# Patient Record
Sex: Male | Born: 1940 | Race: White | Hispanic: No | Marital: Married | Smoking: Current every day smoker
Health system: Southern US, Community
[De-identification: ages and names within clinical notes are randomized; demographics above are authoritative.]

## PROBLEM LIST (undated history)

## (undated) DIAGNOSIS — I219 Acute myocardial infarction, unspecified: Secondary | ICD-10-CM

## (undated) DIAGNOSIS — I10 Essential (primary) hypertension: Secondary | ICD-10-CM

## (undated) DIAGNOSIS — J449 Chronic obstructive pulmonary disease, unspecified: Secondary | ICD-10-CM

## (undated) DIAGNOSIS — E669 Obesity, unspecified: Secondary | ICD-10-CM

## (undated) DIAGNOSIS — R0602 Shortness of breath: Secondary | ICD-10-CM

## (undated) DIAGNOSIS — E7800 Pure hypercholesterolemia, unspecified: Secondary | ICD-10-CM

## (undated) DIAGNOSIS — I2510 Atherosclerotic heart disease of native coronary artery without angina pectoris: Secondary | ICD-10-CM

## (undated) DIAGNOSIS — C3490 Malignant neoplasm of unspecified part of unspecified bronchus or lung: Secondary | ICD-10-CM

## (undated) HISTORY — DX: Essential (primary) hypertension: I10

## (undated) HISTORY — PX: ANTERIOR CERVICAL DECOMP/DISCECTOMY FUSION: SHX1161

## (undated) HISTORY — DX: Pure hypercholesterolemia, unspecified: E78.00

## (undated) HISTORY — PX: APPENDECTOMY: SHX54

## (undated) HISTORY — PX: ARTHRODESIS: SHX136

## (undated) HISTORY — DX: Obesity, unspecified: E66.9

## (undated) HISTORY — DX: Chronic obstructive pulmonary disease, unspecified: J44.9

## (undated) HISTORY — PX: OTHER SURGICAL HISTORY: SHX169

## (undated) HISTORY — PX: CARPAL TUNNEL RELEASE: SHX101

## (undated) HISTORY — DX: Atherosclerotic heart disease of native coronary artery without angina pectoris: I25.10

## (undated) HISTORY — DX: Malignant neoplasm of unspecified part of unspecified bronchus or lung: C34.90

---

## 1999-04-25 ENCOUNTER — Ambulatory Visit (HOSPITAL_COMMUNITY): Admission: RE | Admit: 1999-04-25 | Discharge: 1999-04-25 | Payer: Self-pay | Admitting: Gastroenterology

## 2001-11-24 ENCOUNTER — Ambulatory Visit (HOSPITAL_BASED_OUTPATIENT_CLINIC_OR_DEPARTMENT_OTHER): Admission: RE | Admit: 2001-11-24 | Discharge: 2001-11-24 | Payer: Self-pay | Admitting: Orthopedic Surgery

## 2003-07-06 ENCOUNTER — Encounter: Admission: RE | Admit: 2003-07-06 | Discharge: 2003-07-06 | Payer: Self-pay | Admitting: Geriatric Medicine

## 2003-08-09 ENCOUNTER — Encounter: Admission: RE | Admit: 2003-08-09 | Discharge: 2003-08-09 | Payer: Self-pay | Admitting: Geriatric Medicine

## 2003-10-17 ENCOUNTER — Encounter: Admission: RE | Admit: 2003-10-17 | Discharge: 2004-01-15 | Payer: Self-pay | Admitting: Neurological Surgery

## 2004-03-19 ENCOUNTER — Observation Stay (HOSPITAL_COMMUNITY): Admission: RE | Admit: 2004-03-19 | Discharge: 2004-03-20 | Payer: Self-pay | Admitting: Neurological Surgery

## 2004-07-10 ENCOUNTER — Encounter: Admission: RE | Admit: 2004-07-10 | Discharge: 2004-07-10 | Payer: Self-pay | Admitting: Geriatric Medicine

## 2006-06-17 DIAGNOSIS — I219 Acute myocardial infarction, unspecified: Secondary | ICD-10-CM

## 2006-06-17 HISTORY — PX: CARDIAC CATHETERIZATION: SHX172

## 2006-06-17 HISTORY — DX: Acute myocardial infarction, unspecified: I21.9

## 2007-03-18 DIAGNOSIS — I2510 Atherosclerotic heart disease of native coronary artery without angina pectoris: Secondary | ICD-10-CM

## 2007-03-18 HISTORY — DX: Atherosclerotic heart disease of native coronary artery without angina pectoris: I25.10

## 2007-04-06 ENCOUNTER — Ambulatory Visit: Payer: Self-pay | Admitting: Cardiology

## 2007-04-06 ENCOUNTER — Ambulatory Visit: Payer: Self-pay | Admitting: Internal Medicine

## 2007-04-06 ENCOUNTER — Inpatient Hospital Stay (HOSPITAL_COMMUNITY): Admission: EM | Admit: 2007-04-06 | Discharge: 2007-04-09 | Payer: Self-pay | Admitting: Emergency Medicine

## 2007-04-20 ENCOUNTER — Ambulatory Visit: Payer: Self-pay | Admitting: Internal Medicine

## 2007-04-23 ENCOUNTER — Encounter (HOSPITAL_COMMUNITY): Admission: RE | Admit: 2007-04-23 | Discharge: 2007-04-27 | Payer: Self-pay | Admitting: Internal Medicine

## 2007-08-13 ENCOUNTER — Ambulatory Visit: Payer: Self-pay | Admitting: Cardiology

## 2007-08-13 ENCOUNTER — Observation Stay (HOSPITAL_COMMUNITY): Admission: EM | Admit: 2007-08-13 | Discharge: 2007-08-14 | Payer: Self-pay | Admitting: Emergency Medicine

## 2007-08-20 ENCOUNTER — Ambulatory Visit: Payer: Self-pay

## 2007-08-25 ENCOUNTER — Ambulatory Visit: Payer: Self-pay | Admitting: Internal Medicine

## 2008-03-04 ENCOUNTER — Ambulatory Visit: Payer: Self-pay | Admitting: Internal Medicine

## 2008-07-03 ENCOUNTER — Encounter: Admission: RE | Admit: 2008-07-03 | Discharge: 2008-07-03 | Payer: Self-pay | Admitting: Neurological Surgery

## 2008-08-25 ENCOUNTER — Inpatient Hospital Stay (HOSPITAL_COMMUNITY): Admission: RE | Admit: 2008-08-25 | Discharge: 2008-08-26 | Payer: Self-pay | Admitting: Neurological Surgery

## 2009-04-17 ENCOUNTER — Ambulatory Visit: Payer: Self-pay | Admitting: Internal Medicine

## 2009-04-17 DIAGNOSIS — I509 Heart failure, unspecified: Secondary | ICD-10-CM

## 2009-04-17 DIAGNOSIS — E782 Mixed hyperlipidemia: Secondary | ICD-10-CM | POA: Insufficient documentation

## 2009-04-17 DIAGNOSIS — I5030 Unspecified diastolic (congestive) heart failure: Secondary | ICD-10-CM | POA: Insufficient documentation

## 2009-04-17 DIAGNOSIS — I2510 Atherosclerotic heart disease of native coronary artery without angina pectoris: Secondary | ICD-10-CM | POA: Insufficient documentation

## 2009-04-17 DIAGNOSIS — I10 Essential (primary) hypertension: Secondary | ICD-10-CM | POA: Insufficient documentation

## 2009-04-17 DIAGNOSIS — F17200 Nicotine dependence, unspecified, uncomplicated: Secondary | ICD-10-CM

## 2009-10-19 ENCOUNTER — Ambulatory Visit (HOSPITAL_COMMUNITY): Admission: RE | Admit: 2009-10-19 | Discharge: 2009-10-19 | Payer: Self-pay | Admitting: Geriatric Medicine

## 2010-04-06 ENCOUNTER — Encounter: Admission: RE | Admit: 2010-04-06 | Discharge: 2010-04-06 | Payer: Self-pay | Admitting: Geriatric Medicine

## 2010-04-13 ENCOUNTER — Encounter: Admission: RE | Admit: 2010-04-13 | Discharge: 2010-04-13 | Payer: Self-pay | Admitting: Geriatric Medicine

## 2010-05-02 ENCOUNTER — Ambulatory Visit: Payer: Self-pay | Admitting: Internal Medicine

## 2010-07-19 NOTE — Assessment & Plan Note (Signed)
Summary: PER CHECK OUT/SF      Allergies Added: NKDA  CC:  rov.  Pt states he is having leg pain and burning in calves.   His BP has been up and down lately.  Nothing too high but he has noticed a difference.  [name]  History of Present Illness: [name] is a very pleasant 70 year old male with a history of coronary artery disease with previous inferolateral ST- elevation myocardial infarction treated with PTCA and bare metal stenting of his left circumflex in October 2008.  Ejection fraction was 45%.  He had mild nonobstructive disease elsewhere.  He also has a history of hypertension, hyperlipidemia, obesity, and COPD with ongoing tobacco use.   Myoview March 2009 showed an EF of 56% with previous large inferolateral infarct.  It was thought that the EF was overestimated.  There was no ischemia.   Returns for routine f/u. Several months ago fell while carrying a TV and banged up his left side. 3 weeks ago had MRI for back and leg pain. Had MRI and told he had pinched nerve in his lower back. Got a cortisone injection with minimal relief. Now walks with a cane.   Occasional transient CP about 1x/month. Not related to exertion. Still smoking < 1 ppd. SBP typically in 120s. Dr. Pete Glatter checked his lipids.    Current Medications (verified): 1)  Plavix 75 Mg Tabs (Clopidogrel Bisulfate) .... Take One Tablet By Mouth Daily 2)  Lipitor 80 Mg Tabs (Atorvastatin Calcium) .... Take One Tablet By Mouth Daily. 3)  Carvedilol 6.25 Mg Tabs (Carvedilol) .... Take One Tablet By Mouth Twice A Day 4)  Diovan Hct 80-12.5 Mg Tabs (Valsartan-Hydrochlorothiazide) .... Take One Tablet By Mouth Once Daily. 5)  Aspirin Ec 325 Mg Tbec (Aspirin) .... Take One Tablet By Mouth Daily  Allergies (verified): No Known Drug Allergies  Past History:  Past Medical History: Last updated: 04/17/2009 1. CAD status post inferolateral STEMI October 2008    --PCI with BMS to LCX    --myoview 2009. inferior infarct. no  ischemia 2. Ejection fraction 45% 3. Hypertension 4. High cholesterol 5. COPD with ongoing tobacco 6. Obesity   Review of Systems       As per HPI and past medical history; otherwise all systems negative.   Vital Signs:  Patient profile:   70 year old male Height:      66 inches Weight:      209 pounds BMI:     33.86 Pulse rate:   55 / minute Pulse rhythm:   regular BP sitting:   142 / 74  (left arm) Cuff size:   large  Vitals Entered By: Judithe Modest CMA (May 02, 2010 4:01 PM)   Physical Exam  General:  Well appearing. no resp difficulty. hoarse. walks with cane. HEENT: normal Neck: supple. no JVD. Carotids 2+ bilat; no bruits. No lymphadenopathy or thryomegaly appreciated. Cor: PMI nondisplaced. Regular rate & rhythm. No rubs, gallops, murmur. Lungs: decreased air movement throughout. mild exp wheeze Abdomen: soft, nontender, nondistended. Good bowel sounds. Extremities: no cyanosis, clubbing, rash, edema Neuro: alert & orientedx3, cranial nerves grossly intact. moves all 4 extremities w/o difficulty. affect pleasant    Impression & Recommendations:  Problem # 1:  CAD, NATIVE VESSEL (ICD-414.01) Stable. No evidence of ischemia. Continue current regimen. Said he could stop Plvix if price remained an issue.  Problem # 2:  HYPERTENSION, BENIGN (ICD-401.1) BP slightly elevated here. Well controlled at home. Continue current regimen.  Problem #  3:  TOBACCO ABUSE (ICD-305.1) Counseled on need to quit.  Patient Instructions: 1)  Your physician recommends that you schedule a follow-up appointment in: 1 year 2)  Your physician recommends that you continue on your current medications as directed. Please refer to the Current Medication list given to you today.

## 2010-09-27 LAB — CBC
HCT: 42.7 % (ref 39.0–52.0)
Hemoglobin: 14.9 g/dL (ref 13.0–17.0)
MCHC: 34.8 g/dL (ref 30.0–36.0)
MCV: 92 fL (ref 78.0–100.0)
Platelets: 228 10*3/uL (ref 150–400)
RBC: 4.64 MIL/uL (ref 4.22–5.81)
RDW: 14.4 % (ref 11.5–15.5)
WBC: 10.1 10*3/uL (ref 4.0–10.5)

## 2010-09-27 LAB — BASIC METABOLIC PANEL
BUN: 9 mg/dL (ref 6–23)
CO2: 26 mEq/L (ref 19–32)
Calcium: 9.1 mg/dL (ref 8.4–10.5)
Chloride: 109 mEq/L (ref 96–112)
Creatinine, Ser: 0.82 mg/dL (ref 0.4–1.5)
GFR calc Af Amer: >60 mL/min (ref >60)
GFR calc non Af Amer: >60 mL/min (ref >60)
Glucose, Bld: 97 mg/dL (ref 70–99)
Potassium: 4 mEq/L (ref 3.5–5.1)
Sodium: 141 mEq/L (ref 135–145)

## 2010-09-27 LAB — APTT: aPTT: 32 seconds (ref 24–37)

## 2010-09-27 LAB — PROTIME-INR
INR: 1.1 (ref 0.00–1.49)
Prothrombin Time: 14 seconds (ref 11.6–15.2)

## 2010-10-01 ENCOUNTER — Telehealth: Payer: Self-pay | Admitting: Internal Medicine

## 2010-10-01 ENCOUNTER — Other Ambulatory Visit: Payer: Self-pay | Admitting: Internal Medicine

## 2010-10-01 MED ORDER — CARVEDILOL 6.25 MG PO TABS: 6.2500 mg | ORAL_TABLET | Freq: Two times a day (BID) | ORAL | Status: DC

## 2010-10-01 NOTE — Telephone Encounter (Signed)
Pt needs refill at [facility] for carvedilol-pt out as of Saturday-needs refill asap

## 2010-10-30 NOTE — Assessment & Plan Note (Signed)
[facility]                            CARDIOLOGY OFFICE NOTE   Flores, Grant                       MRN:          [id number]  DATE:03/04/2008                            DOB:          06-01-1941    PRIMARY CARE PHYSICIAN:  Hal T. Stoneking, MD   INTERVAL HISTORY:  Grant Flores is a very pleasant 70 year old male with a  history of coronary artery disease with previous inferolateral ST-  elevation myocardial infarction treated with PTCA and stenting of his  left circumflex in October 2008.  Ejection fraction was 45%.  He had  mild nonobstructive disease elsewhere.  He also has a history of  hypertension, hyperlipidemia, obesity, and COPD with ongoing tobacco  use.   He was admitted back in February with chest pain.  Myoview showed an EF  of 56% with previous large inferolateral infarct.  It was thought that  the EF was overestimated.  There was no ischemia.   He returns today for routine followup.  He says he is doing very well.  He is not exercising per se, but he is still working at [employer] and  denies any chest pain.  He has not had any significant dyspnea.  Unfortunately, he has continued to smoke about half a pack of cigarettes  a day, though he is trying to quit.  He denies any claudication or GI  symptoms.  He has been following his blood pressures at home and  systolics have been ranging from 115-122.  He did have a couple of  readings last week where his systolics were about 100 and he was  somewhat dizzy on standing.  His heart rate has been ranging 50-60.  He  has recently developed recent bronchitis, and he has been seen by Dr.  Pete Glatter.   CURRENT MEDICATIONS:  1. Aspirin 325.  2. Plavix 75 a day.  3. Lipitor 80 a day.  4. Benicar 40/12.5 a day.  5. Coreg 6.25 b.i.d.   PHYSICAL EXAMINATION:  GENERAL:  He is in no acute distress, ambulates  in the clinic without any respiratory distress.  VITAL SIGNS:  Blood pressure is  115/68, on followup it was 138/78, heart  rate is 48, weight is 209.  HEENT:  Normal.  NECK:  Supple.  No JVD.  Carotids are 2+ bilaterally without bruits.  There is no lymphadenopathy or thyromegaly.  CARDIAC:  PMI is nondisplaced.  He has regular rate and rhythm with S4.  No murmur.  LUNGS:  Clear on the right.  He has some bronchial breath sounds and  left lower lobe.  He has no wheezing.  There was a prolonged expiratory  phase.  ABDOMEN:  Obese, nontender, nondistended.  There is no  hepatosplenomegaly, no bruits, no masses appreciated.  EXTREMITIES:  Warm with no cyanosis, clubbing, or edema.  No rash.  NEUROLOGIC:  Alert and oriented x3.  Cranial nerves II through XII are  intact.  Moves all 4 extremities without difficulty.  His voice is  hoarse.  He attributes this to his upper respiratory tract infection.   EKG shows  sinus bradycardia rate of 48 with previous inferior infarct  and previous inferolateral infarct.  No acute ST-T wave abnormalities.  He does have some ST T-wave flattening laterally.   ASSESSMENT AND PLAN:  1. Coronary artery disease is stable.  No evidence of ischemia.      Continue current therapy.  2. Hypertension.  Blood pressure is generally well controlled.  Given      his bradycardia, I did consider dropping back his Coreg to 3.125 a      day, but he says he feels fine with this, and his heart rate seems      to be okay on his monitor.  I told him to watch his monitor      closely.  If his heart rate drops below 50 much, I told him to cut      back his Coreg down to 3.125 twice a day.  3. Hyperlipidemia followed by Dr. Pete Glatter.  Goal LDL is less than      70.  4. Tobacco use.  Once again counseled him on the need to stop smoking.      I did ask him to followup with Dr. Pete Glatter.  If his bronchitis      and hoarseness persisted, perhaps I think about getting a chest x-      ray.   DISPOSITION:  We will see him back for routine followup in 9  months.     Grant Buckles. Bensimhon, MD  Electronically Signed    DRB/MedQ  DD: 03/04/2008  DT: 03/05/2008  Job #: [id number]

## 2010-10-30 NOTE — H&P (Signed)
NAME:  Grant Flores, Grant Flores                ACCOUNT NO.:  [id number]   MEDICAL RECORD NO.:  [id number]          PATIENT TYPE:  INP   LOCATION:  1827                         FACILITY:  [facility]   PHYSICIAN:  Bevelyn Buckles. Bensimhon, MDDATE OF BIRTH:  14-Dec-1940   DATE OF ADMISSION:  04/06/2007  DATE OF DISCHARGE:                              HISTORY & PHYSICAL   PRIMARY CARE PHYSICIAN:  Hal T. Stoneking, M.D.   REASON FOR ADMISSION:  Acute inferior lateral ST elevation myocardial  infarction.   HISTORY OF PRESENT ILLNESS:  [name] is a 70 year old male with  history of hypertension and tobacco use.  He has no known history of  heart disease.  He has never had a cardiac catheterization.  Saturday  night he developed some chest burning and throat tightness.  He went to  Urgent Care.  He did not get an EKG.  He was treated for presumed  bronchitis.  Symptoms resolved shortly thereafter.  This evening he went  to bed and woke up about 11 o'clock with recurrent chest and throat  burning.  He came to the emergency room.  On arrival to the emergency  room, he had about 8/10 symptoms.  He had about 7 mm of inferior lateral  ST elevation.  A code study was activated.  He was treated with heparin,  aspirin, nitroglycerin, 600 mg of Plavix as well as Lopressor and  brought emergently to the catheterization lab.   REVIEW OF SYSTEMS:  He denies any heart failure symptoms.  He has not  had any bleeding, no focal neurologic symptoms.  He is not a diabetic.  The remainder of the review of systems is negative except for the HPI  and problem list.   PROBLEM LIST:  1. Hypertension.  2. Obesity.  3. Tobacco use ongoing.   CURRENT MEDICATION:  Benicar.   ALLERGIES:  NO KNOWN DRUG ALLERGIES.   SOCIAL HISTORY:  He is married.  He works at [employer].  He smokes  about a pack and a half of cigarettes a day which he has done for a long  time.  He denies any alcohol or drug use.   FAMILY HISTORY:  Mother died  at 26, no history of coronary artery  disease.  Father died at 64, apparently had an myocardial infarction at  age 64.   PHYSICAL EXAM:  GENERAL APPEARANCE:  He is uncomfortable.  His  respirations are unlabored.  VITAL SIGNS:  Blood pressure was 150/90.  Heart rate was 70.  He was  sating 96% on 2 liters nasal cannula.  HEENT:  Unremarkable, otherwise normal.  NECK:  Thick, unable to clearly assess JVD.  Carotids are 1+ bilaterally  without any bruits.  There is no lymphadenopathy or thyromegaly.  CARDIAC:  PMI was mildly laterally displaced.  He had distant heart  sounds.  He was regular with no obvious murmurs, rubs or gallops.  LUNGS:  Clear.  ABDOMEN:  Obese, nontender, nondistended.  There is no  hepatosplenomegaly, no bruits.  No masses appreciated.  Hypoactive bowel  sounds.  EXTREMITIES:  Warm.  There  is no cyanosis, clubbing or edema.  Distal  pulses were 1+ bilaterally.  No rash.   EKG shows normal sinus rhythm at a rate of 94.  There is 7 mm of the  inferolateral ST depression as well as reciprocal ST depression in V1-V2  consistent with inferior posterolateral myocardial infarction.   LABORATORY DATA:  Current labs show a white count of 21.1, hemoglobin of  15.6, platelet count of 319.  Sodium is 139, potassium 3.3, chloride  103, glucose 136, BUN 13, creatinine 0.9.   ASSESSMENT:  1. Acute inferior posterolateral myocardial infarction.  2. Hypertension.  3. Tobacco use.  4. Prominent leukocytosis.   PLAN/DISCUSSION:  He is being taken emergently to the catheterization  lab with Dr. Riley Kill.  Postcatheterization, he will need cardiac rehab  as well as smoking cessation consultation.  We will need to watch his  leukocytosis.  This may be simply reactive due to his myocardial  infarction, but I will also have to rule out other underlying causes.      Bevelyn Buckles. Bensimhon, MD  Electronically Signed     DRB/MEDQ  D:  04/06/2007  T:  04/06/2007  Job:  [id number]

## 2010-10-30 NOTE — Assessment & Plan Note (Signed)
[facility]                            CARDIOLOGY OFFICE NOTE   Grant Flores                       MRN:          [id number]  DATE:04/20/2007                            DOB:          29-Nov-1940    PRIMARY CARE PHYSICIAN:  Merlene Laughter, M.D.   INTERVAL HISTORY:  [name] is a very pleasant 70 year old male with a  history of hypertension, hyperlipidemia and COPD.  He was recently  hospitalized for an acute anterolateral ST-elevation myocardial  infarction.  He underwent stenting of a 99% left circumflex lesion.  He  had mild nonobstructive disease elsewhere.  Ejection fraction was 45%.   He returns today for routine followup.  He is doing well.  No chest pain  or shortness of breath.  He has essentially quit smoking.  He has only  had four cigarettes since his discharge and is committed to quitting  completely.  He has not had any orthopnea, no PND, no lower extremity  edema.   CURRENT MEDICATIONS:  1. Benicar 10 mg a day.  2. Aspirin 325.  3. Plavix 75.  4. Lipitor 80.  5. Coreg 3.125 b.i.d.   PHYSICAL EXAMINATION:  He is well-appearing, no acute distress.  Ambulates around the clinic without any respiratory difficulty.  Affect  is very pleasant.  Blood pressure is 142/78, heart rate 61, weight is  212.  HEENT:  Normal.  NECK:  Supple, no JVD.  CAROTIDS:  Are 2+ bilaterally without any bruits.  There is no  lymphadenopathy or thyromegaly.  CARDIAC:  PMI is nondisplaced.  He has regular rate and rhythm.  No  murmurs, rubs or gallops.  LUNGS:  Clear.  ABDOMEN:  Obese, nontender, nondistended.  There is no  hepatosplenomegaly, no bruits, no masses, good bowel sounds.  EXTREMITIES:  Warm with no cyanosis, clubbing or edema.  No rash.  NEUROLOGIC:  Alert and oriented x3.  Cranial nerves II-XII are intact.  Moves all four extremities without difficulty.   EKG shows normal sinus rhythm, rate of 63, with a lateral infarct with Q  waves  in I, aVL and V6.  There are no acute ST-T wave abnormalities.   ASSESSMENT AND PLAN:  1. Coronary artery disease status post recent inferolateral myocardial      infarction.  He is doing well.  No evidence of ischemia.  He is      being referred to cardiac rehab.  2. Hypertension.  Blood pressure is back up.  We will switch him back      over to his Benicar HCT 40/12.5.  3. Hyperlipidemia.  Continue statin.  Goal LDL is less than 70.  4. Tobacco use.  I congratulated him on his cutting down and      encouraged him to quit completely.  5. Ischemic cardiomyopathy.  LV is only mildly decreased.  Continue      current therapy.  He is on a beta blocker and ARB.  Will titrate      his Coreg as tolerated.   DISPOSITION:  We will see him back in several months for routine  followup.  I cleared him to go back to work in 2 weeks.     Bevelyn Buckles. Bensimhon, MD  Electronically Signed    DRB/MedQ  DD: 04/20/2007  DT: 04/21/2007  Job #: [id number]   cc:   Grant Flores, M.D.

## 2010-10-30 NOTE — Cardiovascular Report (Signed)
NAME:  Grant Flores, Grant Flores NO.:  [id number]   MEDICAL RECORD NO.:  [id number]          PATIENT TYPE:  INP   LOCATION:  2040                         FACILITY:  [facility]   PHYSICIAN:  Arturo Morton. Riley Kill, MD, FACCDATE OF BIRTH:  28-Aug-1940   DATE OF PROCEDURE:  04/06/2007  DATE OF DISCHARGE:  04/09/2007                            CARDIAC CATHETERIZATION   INDICATIONS:  The patient is a 70 year old who presents with an  inferolateral wall myocardial infarction.  He was seen by Dr. Gala Romney,  and set up for urgent cardiac catheterization.  EKG was consistent with  ST-elevation MI.   PROCEDURE:  1. Left heart catheterization.  2. Selective coronary arteriography.  3. Selective left ventriculography.  4. PTCA and stenting of the circumflex coronary artery.   DESCRIPTION OF THE PROCEDURE:  The patient was brought to the  catheterization laboratory and prepped and draped in the usual fashion.  Through an anterior puncture the femoral artery was entered.  Views of  the left and right coronaries were obtained in multiple angiographic  projections.  The patient received clopidogrel and also bivalirudin.  The patient had a ruptured plaque in the mid circumflex coronary artery.  The patient had a therapeutic ACT after administration of the Angiomax.  A 180-cm Asahi Prowater wire was then utilized.  A 6-French FL-4 Scimed  guiding catheter was utilized and the lesion was crossed with a Midwife.  A balloon dilatation was done with a 2.5 x 12 Maverick.  Intracoronary nitroglycerin was then administered.  ACT was checked  during the case and was 407 seconds.  A 2.75 x 23 Vision non-drug-  eluting platform was then deployed to the site, and deployed at 13  atmospheres.  Post dilatation was then done up to 14 atmospheres  throughout the course of the stent.  Because of slow flow, intracoronary  verapamil was administered.  Following this, ventriculography was  performed in the  RAO projection.  The femoral sheath was sutured into  place without complication.  The patient was taken to the coronary care  unit in satisfactory clinical condition.   HEMODYNAMIC DATA:  Initial central aortic pressure was 123/78 with a  mean of 98.  Left ventricular pressure was 142/33.  There was not a  significant gradient on pullback across the aortic valve.   ANGIOGRAPHIC DATA:  1. Ventriculography was done in the RAO projection.  There was a      moderate area of inferior wall hypokinesis with an estimate of the      ejection fraction of around 50%.  This extended from two-thirds      down the inferior wall up to the inferior base.  2. The right coronary artery is a fairly nondominant vessel with a      single small PDA and has segmental irregularities but only 40%      narrowing.  3. The left main is free of critical disease.  4. The LAD courses to the apex.  There is some irregularity in the mid      vessel with a fair amount of ectasia.  There is a first diagonal      without critical disease and a second diagonal and a third diagonal      that has about 70% ostial stenosis.  It is only a small- to      moderate-size diagonal as well.  5. The circumflex provides a first marginal branch that has luminal      irregularities and about 40% narrowing.  There is also an AV      circumflex which goes to the posterolateral wall.  The continuation      of the first marginal is into a second marginal which is subtotally      occluded.  Following balloon dilatation and stenting, this vessel      was opened up.  There is some diffuse irregularity distally.  TIMI      3 flow was reestablished.   CONCLUSIONS:  1. Acute inferior lateral wall myocardial infarction due to circumflex      occlusion with successful percutaneous stenting using bivalirudin      and clopidogrel loading as per the [facility] protocol.  2. Mild residual disease involving the other coronary segments.    DISPOSITION:  The patient will be treated with aggressive risk factor  reduction.  Aspirin and Plavix will be administered, preferably for 1  year, and then aspirin thereafter.      Arturo Morton. Riley Kill, MD, [facility]  Electronically Signed     TDS/MEDQ  D:  05/18/2007  T:  05/18/2007  Job:  [id number]

## 2010-10-30 NOTE — Op Note (Signed)
NAME:  ABELINO, TIPPIN NO.:  [id number]   MEDICAL RECORD NO.:  [id number]          PATIENT TYPE:  OIB   LOCATION:  3599                         FACILITY:  [facility]   PHYSICIAN:  Stefani Dama, M.D.  DATE OF BIRTH:  1940/09/19   DATE OF PROCEDURE:  DATE OF DISCHARGE:                               OPERATIVE REPORT   PREOPERATIVE DIAGNOSIS:  Cervical spondylosis with myelopathy C3-C4,  status post arthrodesis C4-C6.   POSTOPERATIVE DIAGNOSIS:  Cervical spondylosis with myelopathy C3-C4,  status post arthrodesis C4-C6.   PROCEDURE:  Removal of old hardware C4-C6, decompression of C3-C4 with  spondylitic myelopathy and cervical radiculopathy, interbody arthrodesis  with allograft C3-C4, anterior plate fixation Z6-X0 with Alphatec plate.   SURGEON:  Stefani Dama, MD   FIRST ASSISTANT:  Cristi Loron, MD   ANESTHESIA:  General endotracheal.   INDICATIONS:  Jahi Roza is a 70 year old individual who has had  significant neck, shoulder, and bilateral arm pain.  He has evidence of  advanced spondylosis of C3-C4.  He has been advised regarding surgical  decompression.  He has evidence of cord compression at the level of C3-  C4.   PROCEDURE:  The patient was brought to the operating room, placed on  table in supine position.  After smooth induction of general  endotracheal anesthesia, he was placed in 5 pounds of Halter traction.  Neck was prepped with alcohol, DuraPrep and draped in sterile fashion.  Transverse incision was made in the left side of the neck above his  previously placed incision.  Dissection was carried down through the  platysma and the plane between the sternocleidomastoid and strap muscles  were dissected bluntly until the prevertebral space was reached.  First  identifiable disk space was noted be that of C3-C4 on the radiograph and  then by dissecting inferiorly, the old plate was identified.  It was  gradually skeletonized and each of  the screws were removed individually.  Ultimately, the plate could be removed.  However, at the inferior-most  aspect, there was bone grown over the plate itself and this required  some use of an osteotome and carefully chipping away the bone over the  plate to release it, so that the plate could be removed.  Once the plate  was removed, attention was turned to the C3-C4, a large ventral  osteophyte was taken off with a Leksell rongeur.  Then, a diskectomy was  performed removing a substantial quantity of severely degenerated  desiccated disk material from the interspace at C3-C4.  As the region of  posterior longitudinal ligament was reached, there was noted to be a  large osteophyte from the inferior margin of body of C3 that caused  compression into the spinal canal itself.  This was carefully taken up  after drilling it free from the inferior margin of body of C3 and it was  removed in a piecemeal fashion.  Then, the posterior longitudinal  ligament was taken up.  This allowed for good decompression from side-to-  side and ultimately the undersurface of C4 was taken down with a  2-mm  Kerrison punch very carefully.  With the decompression being obtained  meticulously, hemostasis in the space was obtained using some pledgets  of Gelfoam soaked in thrombin, which were later irrigated away.  An 8-mm  trans graft was then cut shaped in size to the appropriate size to fit  within the interspace.  It was filled with demineralized bone matrix in  the form of BMP putty.  This was then placed into the space.  Traction  was removed.  A 16-mm standard size trestle plate was fitted to the  ventral aspect of the vertebral bodies and secured with 4 locking 4 x 14  mm screws.  Then, with a final radiograph confirming good position of  the hardware, the area was  irrigated copiously with antibiotic irrigating solution and the platysma  was closed with 3-0 Vicryl and 3-0 Vicryl was used in the  subcuticular  tissues.  The dry sterile dressing was placed on the skin.  The patient  tolerated the procedure well and was returned to recovery in stable  condition.      Stefani Dama, M.D.  Electronically Signed     HJE/MEDQ  D:  08/25/2008  T:  08/26/2008  Job:  [id number]

## 2010-10-30 NOTE — Assessment & Plan Note (Signed)
[facility]                            CARDIOLOGY OFFICE NOTE   JAMIEN, CASANOVA                       MRN:          [id number]  DATE:08/25/2007                            DOB:          1941/04/12    PRIMARY CARE PHYSICIAN:  Dr. Merlene Laughter.   INTERVAL HISTORY:  Mr. Lundstrom is a 70 year old male with a history of  previous inferolateral ST elevation myocardial infarction treated with  PTCA and stenting of his left circumflex in October 2008.  Ejection  fraction was 45%.  He had mild nonobstructive disease elsewhere.  He  also has a history of hypertension, hyperlipidemia, obesity, and COPD  with ongoing tobacco use.   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  After a long period of frustration  he developed dizziness and became somewhat sleepy as if he was going to  be passed out.  He was taken to the hospital.  He ruled out for  myocardial infarction with serial cardiac markers.  All of his other  workup was negative.  He was discharged home with an outpatient Myoview  which showed an EF of 56% with a previous large inferolateral infarct.  It was thought that the EF was overestimated.   Since that time he says he has not had any significant problems.  He  feels okay.  He does say that he does not sleep well. He does have some  snoring, but denies extreme daytime fatigue.  He has not had any further  chest pain or dyspnea.  He is still smoking several cigarettes a day.   CURRENT MEDICATIONS:  1. Aspirin 325.  2. Plavix 75.  3. Lipitor 80.  4. Coreg 3.125 b.i.d.  5. Benicar 40/12.5 a day.   PHYSICAL EXAMINATION:  GENERAL:  On walking into the room he was sitting  on the exam table falling asleep.  He was easily arousable.  He was not  snoring at the time.  He had only been in there several minutes by  himself.  VITAL SIGNS:  Blood pressure was 132/72, heart rate 65, weight 210.  HEENT:  Normal.  NECK:  Supple.  There is no JVD.  Carotids 2+ bilaterally without  bruits.  There is no lymphadenopathy or thyromegaly.  CARDIAC:  Regular  rate and rhythm with S4.  PMI is nondisplaced.  There is no murmur.  LUNGS:  Clear.  ABDOMEN:  Obese, nontender, nondistended.  There is no  hepatosplenomegaly, no bruits, no masses.  Good bowel sounds.  EXTREMITIES:  Warm with no cyanosis, clubbing or edema.  No rash.  NEUROLOGICAL:  Alert and oriented x3.  Cranial nerves II-XII are intact.  Moves all four extremities without difficulty.   ASSESSMENT/PLAN:  1. Coronary artery disease status post previous inferolateral      myocardial infarction.  Is doing well.  There is no evidence of      ischemia.  A stress test  was reassuring.  2. Hypertension.  His blood pressure continues to be elevated.      Increase Coreg to 6.25 b.i.d.  3. Hyperlipidemia.  Continue statin.  Goal LDL less than 70.  This is      followed by Dr. Pete Glatter.  4. Tobacco use.  Once again reminded him of the need to stop smoking.  5. Dizziness and fatigue.  I suspect he probably has severe      obstructive sleep apnea.  I have talked to him at length about      this.  He refuses a sleep study at this time.  I also have told him      to consider behavioral methods such as weight loss.   DISPOSITION:  Will see him back in six months for routine follow-up.     Bevelyn Buckles. Bensimhon, MD  Electronically Signed    DRB/MedQ  DD: 08/25/2007  DT: 08/26/2007  Job #: [id number]

## 2010-10-30 NOTE — Discharge Summary (Signed)
NAME:  Grant Flores, Grant Flores NO.:  [id number]   MEDICAL RECORD NO.:  [id number]          PATIENT TYPE:  OBV   LOCATION:  5511                         FACILITY:  [facility]   PHYSICIAN:  Jonelle Sidle, MD DATE OF BIRTH:  [birth year]   DATE OF ADMISSION:  08/13/2007  DATE OF DISCHARGE:  08/14/2007                               DISCHARGE SUMMARY   PRIMARY CARDIOLOGIST:  Bevelyn Buckles. Bensimhon, M.D.   PRIMARY CARE PHYSICIAN:  Hal T. Stoneking, M.D.   PROCEDURES:  Performed during hospitalization:  None.   DISCHARGE DIAGNOSES:  1. Atypical chest pain, resolved.  2. Status post lateral myocardial infarction in 2008.      a.     Status post bare metal stent of the left circumflex.  3. Ischemic cardiomyopathy with an ejection fraction of 45%.  4. Hypertension.  5. Hypercholesterolemia.  6. Chronic obstructive pulmonary disease.  7. Obesity.  8. Ongoing tobacco abuse.   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  The patient began to  have some dizziness associated with this frustration.  He also began to  feel very sleepy but did not feel as if he was going to pass out.  The  patient felt a burning episode in his neck and chest pain that was  similar to his symptoms he had previous to when he presented for MI.  The patient did not have any further symptoms since that one episode of  chest burning and his dizziness resolved before he came to the emergency  room.  The patient was sent here by a first aid worker on his job to  have further evaluation.  The patient states that he felt this was  related to stress.  The patient was seen and examined by Dr. Simona Huh and Valetta Close, M.D. cardiology resident in the emergency room.  The patient was  admitted  to rule out myocardial infarction.  The patient was not placed  on any anticoagulation but monitored closely.  EKG showed normal sinus rhythm, sinus brady with evidence of prior  lateral infarct and inferior infarct with no new EKG changes.  Initial  cardiac enzymes were found to be negative.  All other labs were found to  be stable.  The patient was kept overnight for observation and was without chest  discomfort overnight or any other symptoms.  The patient was seen and examined by Dr. [name] on day of  discharge and blood pressure was found to be stable at 124/69, heart  rate 84, respirations 20, temperature 97.6.  The patient's EKG revealed  a normal sinus rhythm with age-indeterminate lateral MI.   The patient was found to be stable for discharge and will follow up with  Dr. Arvilla Meres and have an adenosine stress test as an outpatient  with continued cardiac management.   DISCHARGE LABORATORY:  Cholesterol 98, lipids 60, HDL  36, LDL 50.  Sodium 143, potassium 4, chloride 108, CO2 29, glucose 95, BUN 9,  creatinine 0.82.  Hemoglobin 14, hematocrit 41.5, white blood cells  10.6, platelets 212.  Troponin 0.01.  CK 143, CK-MB 3.6.  Chest x-ray  revealing chronic interstitial disease, similar to 2005, no definite CHF  or pneumonia.   DISCHARGE MEDICATIONS:  1. Benicar/HCTZ 40/12.5 mg.  2. Aspirin 325 mg daily.  3. Plavix 75 mg daily.  4. Lipitor 80 mg daily.  5. Coreg 3.125 mg twice a day.  6. Nitroglycerin 0.4 mg p.r.n. chest discomfort.   ALLERGIES:  No known drug allergies.   FOLLOWUP PLANS/APPOINTMENTS:  1. The patient to follow up with an adenosine stress Myoview on March      5th at 7:45 a.m. in the [facility] Cardiology Office.  2. The patient is to follow up with Dr. Bevelyn Buckles. Bensimhon on March      10th at 2 p.m. for a followup appointment and discussion of test      results.  3. The patient is to follow up with his primary care physician, Dr.       Pete Glatter, for continued medical management.   The patient has been given a prescription for nitroglycerin to be used  p.r.n. for recurrent chest discomfort.   Time spent with the patient to include physician time, 40 minutes.      Bettey Mare. Lyman Bishop, NP      Jonelle Sidle, MD  Electronically Signed    KML/MEDQ  D:  08/14/2007  T:  08/14/2007  Job:  [id number]   cc:   Hal T. Stoneking, M.D.

## 2010-10-30 NOTE — Discharge Summary (Signed)
NAME:  Grant Flores, REIHL NO.:  [id number]   MEDICAL RECORD NO.:  [id number]          PATIENT TYPE:  INP   LOCATION:  2040                         FACILITY:  [facility]   PHYSICIAN:  Arturo Morton. Riley Kill, MD, FACCDATE OF BIRTH:  [birth year]   DATE OF ADMISSION:  04/06/2007  DATE OF DISCHARGE:  04/09/2007                               DISCHARGE SUMMARY   PRIMARY CARDIOLOGIST:  Dr. Arvilla Meres.   PRIMARY CARE Melisha Eggleton:  Dr. Merlene Laughter.   DISCHARGE DIAGNOSIS:  Acute inferolateral ST-segment elevation  myocardial infarction.   SECONDARY DIAGNOSES:  1. Hypertension.  2. Ongoing tobacco abuse.  3. Hyperlipidemia.  4. Obesity.  5. Ischemic cardiomyopathy with an ejection fraction of approximately      45% by echocardiogram on this admission.  6. Chronic obstructive pulmonary disease.   ALLERGIES:  NO KNOWN DRUG ALLERGIES.   PROCEDURES:  Left heart cardiac catheterization with successful PCI and  stenting of the left circumflex with a  2.75 x 23-mm Vision bare-metal  stent.  A 2-D echocardiogram revealing EF of approximately 45% with  posterior hypokinesis and mild hypokinesis of the base/mid lateral wall.   HISTORY OF PRESENT ILLNESS:  A 70 year old Caucasian male without prior  history of CAD.  He was in his usual state of health until the evening  of October 18 when he developed throat tightness and was evaluated at  urgent care and was treated for presumed bronchitis with antibiotics and  steroids.  Symptoms resolved shortly thereafter, and then on the evening  of October 20, he had recurrence of chest and throat burning.  He  presented to the [facility] emergency room where he was noted to have 7-  mm inferolateral ST-segment elevation.  A code stenting STEMI was  activated, and the patient was taken emergently to the cardiac  catheterization lab.   HOSPITAL COURSE:  Emergent cardiac catheterization was performed  revealing 99% stenosis in the left circumflex  with otherwise diffuse  irregularities and nonobstructive disease.  The circumflex was  successfully stented with a 2.75 x 23-mm Vision bare-metal stent.  Postprocedure, the patient was monitored in the coronary intensive care  unit and did not have any recurrent symptoms.  He was started on beta  blocker, aspirin, Plavix and high-dose statin therapy which he has  tolerated up to this point.  A 2-D echocardiogram was performed on  October 20 revealing an EF of approximately 45% with posterior  hypokinesis and mild hypokinesis of the base/mid lateral wall.  In the  setting of ischemic cardiomyopathy, his beta-blocker was changed to  carvedilol.  He has remained euvolemic throughout hospitalization and  has not had any recurrent chest discomfort.  He has been counseled on  the importance of smoking cessation and is being discharged home today  in satisfactory condition.   DISCHARGE LABORATORY DATA:  Hemoglobin 13.9, hematocrit 40.8, WBC 11.9,  platelets 228, MCV 89.8.  Sodium 138, potassium 4.2, chloride 107, CO2  24, BUN 12, creatinine 0.73, glucose 98.  PT 12.3, INR 0.9, PTT 29, peak  CK 5295, peak MB 499.7, troponin I was  greater than 100.  Total  cholesterol 180, triglycerides 113, HDL 42, LDL 115, calcium 8.7,  hemoglobin A1c 5.6.   DISPOSITION:  The patient is being discharged home today in good  condition.   FOLLOW-UP PLANS AND APPOINTMENTS:  He has follow-up scheduled with Dr.  Arvilla Meres in our [facility] clinic on November 3 at 4 p.m.  He is  asked to follow up with Dr. Pete Glatter as previously scheduled.   DISCHARGE MEDICATIONS:  1. Aspirin 325 mg daily.  2. Plavix 75 mg daily.  3. Lipitor 80 mg every night.  4. Benicar 10 mg daily.  5. Coreg 3.125 mg b.i.d.  6. Nitroglycerin 0.4 mg sublingual p.r.n. chest pain.   OUTSTANDING LABORATORY STUDIES:  None.   DURATION OF DISCHARGE ENCOUNTER:  50 minutes including physician time.      Grant Flores,  ANP      Arturo Morton. Riley Kill, MD, [facility]  Electronically Signed    CB/MEDQ  D:  04/09/2007  T:  04/10/2007  Job:  [id number]   cc:   Hal T. Stoneking, M.D.

## 2010-10-30 NOTE — H&P (Signed)
NAME:  Grant Flores, Grant Flores NO.:  [id number]   MEDICAL RECORD NO.:  [id number]          PATIENT TYPE:  OBV   LOCATION:  1826                         FACILITY:  [facility]   PHYSICIAN:  Jonelle Sidle, MD DATE OF BIRTH:  [birth year]   DATE OF ADMISSION:  08/13/2007  DATE OF DISCHARGE:                              HISTORY & PHYSICAL   PRIMARY CARDIOLOGIST:  Dr. Arvilla Meres.   PRIMARY CARE PHYSICIAN:  Dr. Merlene Laughter.   CHIEF COMPLAINT:  Chest pain.   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  After a long time of having this frustration, he developed dizziness. He  described the dizziness as feeling very sleepy but not as if he was  going to pass out. He then developed a few seconds episode of burning in  his neck and chest pain that was similar to the symptoms he had when he  previously presented for a myocardial infarction in October 2008, though  it did not last as long as was not as severe. He has not had any  symptoms since this and his dizziness resolved shortly after removing  himself from the situation of extreme stress. He presented to the first  aide person at his job and they referred him to the emergency department  for further evaluation.   PAST MEDICAL HISTORY:  CAD status post inferolateral STEMI October 2008,  status post bare metal stent of the left circumflex. Ejection fraction  45%. He also has hypertension, high cholesterol, COPD, obesity and  continues to smoke.   MEDICATIONS:  1. Benicar HCT 40-12.5.  2. Aspirin 325mg  once a day.  3. Plavix 75mg  once a day.  4. Lipitor 80mg  once a day.  5. Coreg 3.125mg  twice daily.   He lives in  [place] with his wife. He works at [employer]. He smokes  between 4 cigarettes to 1-1/2 packs per day and he smoked 1-1/2 packs  per day for many years. No alcohol use, no drug use, no over-the-counter  medications. He says his diet is somewhat poor. His exercise tolerance  is normal.   FAMILY HISTORY:  Noncontributory. No history of premature coronary  artery disease.   REVIEW OF SYSTEMS:  Negative.   PHYSICAL EXAMINATION:  Temperature 97.5, pulse 57, blood pressure 97/60.  O2 sat 98% on room air.  GENERAL:  He was in no apparent distress sitting up and talking freely.  HEENT:  Benign.  NECK:  Supple with no JVD, no carotid bruits. He has no lymphadenopathy.  CARDIOVASCULAR:  S1, S2, no murmurs, regular rate and rhythm.  LUNGS:  He had coarse bilateral lung sounds that improved somewhat with  cough. He did have a productive cough that he had been complaining of a  cold over  the last week.  ABDOMEN:  Soft, nontender, nondistended, normal bowel sounds.  He had no lower extremity edema.  He was alert and oriented x3.  NEUROLOGIC:  Grossly intact.   Chest x-ray showed chronic interstitial disease but nothing acute. His  EKG had a rate of 57 with a somewhat bradycardic rhythm, normal axis. PR  interval was 204, QRS 92. He had QT elongation at 548. He had no  hypertrophy. He had some lateral Q waves. No acute ST-T changes. At the  time of this dictation, he has point of care markers and ISTAT,  creatinine, CK-MB with troponin I and a CMET all pending.   ASSESSMENT/PLAN:  A 70 year old male with a history of CAD status post  STEMI and PCI October 2008 presenting with a brief episode of chest pain  similar to his prior symptoms but very brief in nature and associated  with frustration but not necessarily exertion. Will admit for  observation to telemetry, rule out myocardial infarction, check markers.  He does not need anticoagulation at this time. If his cardiac markers  are  negative, we can likely discharge him home tomorrow with an  outpatient Myoview and followup. If his cardiac enzymes are positive,  with will have to consider angiography.      Valetta Close, M.D.  Electronically Signed      Jonelle Sidle, MD  Electronically Signed    JC/MEDQ  D:  08/13/2007  T:  08/13/2007  Job:  [id number]

## 2010-11-01 ENCOUNTER — Other Ambulatory Visit: Payer: Self-pay | Admitting: Geriatric Medicine

## 2010-11-01 DIAGNOSIS — M48061 Spinal stenosis, lumbar region without neurogenic claudication: Secondary | ICD-10-CM

## 2010-11-02 ENCOUNTER — Ambulatory Visit
Admission: RE | Admit: 2010-11-02 | Discharge: 2010-11-02 | Disposition: A | Discharge status: Home / Self Care | Payer: Medicare Other | Source: Ambulatory Visit | Attending: Geriatric Medicine | Admitting: Geriatric Medicine

## 2010-11-02 DIAGNOSIS — M48061 Spinal stenosis, lumbar region without neurogenic claudication: Secondary | ICD-10-CM

## 2010-11-02 NOTE — Letter (Signed)
May 18, 2007    Ms. Norberto Sorenson   RE:  DANTAE, MEUNIER  MRN:  [id number]  /  DOB:  Mar 28, 1941   Dear Ms. Newton Pigg:   I am writing this letter in regard to Mr. Grills's ability to return to  work.  As you know, he is a very pleasant 70 year old male who presented  with an acute inferolateral ST elevation myocardial infarction on  April 06, 2007.  He underwent emergent cardiac catheterization by Dr.  Bonnee Quin.  This showed an ejection fraction of approximately 45-50%  with moderate hypokinesis of the inferior wall.  Regarding his  coronaries, he had a totally occluded marginal branch off a circumflex  with some mild-to-moderate coronary stenoses elsewhere.  He underwent  successful PCI and stenting.  He has recovered nicely and has been going  to cardiac rehabilitation.  He denies any further chest pain.  He is  back to his baseline functional capacity and eager to go back to work.   In typical fashion, we asked him to limit his physical activity for at  least several weeks post-infarction.  In particular, we told him not to  lift more than 40 pounds and to avoid heavy straining.  However, he is  now at least 6 weeks out from his infarction and doing quite well.   At this point, I have released him to go back to work without any  physical restrictions.  I have asked him to use good judgment to guide  his day to day activity, and should he feel that he is overexerting  himself, he is most certainly to cut back a bit.   We will continue to follow him in cardiology clinic.  Should you have  any questions regarding his care, please do not hesitate to contact me  directly.    Sincerely,      Bevelyn Buckles. Bensimhon, MD  Electronically Signed    DRB/MedQ  DD: 05/18/2007  DT: 05/19/2007  Job #: [id number]

## 2010-11-02 NOTE — Op Note (Signed)
[facility]  Patient:    Grant Flores, Grant Flores Visit Number: [id number] MRN: [id number]          Service Type: DSU Location: [facility] Attending Physician:  Susa Day Dictated by:   Katy Fitch Naaman Plummer., M.D. Proc. 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  OPERATION PERFORMED:  Release of left transverse carpal ligament.  OPERATING SURGEON:  Josephine Igo, M.D.  ASSISTANT:  Annye Rusk, P.A-C.  ANESTHESIA:  General by LMA.  SUPERVISING ANESTHESIOLOGIST:  Dr. Gelene Mink.  INDICATIONS:  Leobardo Granlund is a 70 year old man referred by Dr. Pete Glatter for evaluation and management of hand numbness.  He has a known history of bilateral carpal tunnel syndrome and had positive electrodiagnostic studies. He is status post release of his right transverse carpal ligament with good result.  Recently, he was re-evaluated for numbness on the left side. Electrodiagnostic studies were completed by Dr. Johna Roles which revealed prolongation of his median motor and sensory distal latencies as well as a prolonged lumbrical interosseous difference.  An ulnar nerve study at this level was normal.  We advised the patient to proceed with release of his transverse carpal ligament.  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  He also had a very brawny type hand with dense fascia.  In my judgment he will likely require treatment in the future for his Dupuytrens disease as it  inexorably progresses.  DESCRIPTION OF PROCEDURE:  Shailen Thielen was brought to the operating room and placed in supine position on the operating table.  General anesthesia was induced by LMA.  The left arm was prepped with Betadine soap and solution and sterilely draped.  When the arm was exsanguinated with an Esmarch bandage, the arterial tourniquet on the proximal brachium was inflated to 230 mHg.  The procedure commenced with a short incision in line of the ring finger in the palm.  The subcutaneous tissues were carefully divided revealing the palmar fascia.  This was split longitudinally to reveal the common sensory branch of the median nerve.  These were followed back to the transverse carpal ligament which was gently separated from the median nerve.  The ligament was released on its ulnar border with scissors extending into the distal forearm.  This widely opened the carpal canal.  No masses or other predicaments were noted.  A Glorious Peach was used to release the palmar fascia distally.  Bleeding points were electrocauterized with bipolar current followed by repair of the skin with intradermal 3-0 Prolene suture.  A compressive dressing was applied with a volar plaster splint maintaining the wrist in 5 degrees dorsiflexion. Dictated by:   Katy Fitch Naaman Plummer., M.D. Attending Physician:  Susa Day DD:  11/24/01 TD:  11/26/01 Job: [id number]

## 2010-11-02 NOTE — Op Note (Signed)
NAME:  Grant Flores, Grant Flores                ACCOUNT NO.:  [id number]   MEDICAL RECORD NO.:  [id number]          PATIENT TYPE:  INP   LOCATION:  3003                         FACILITY:  [facility]   PHYSICIAN:  Stefani Dama, M.D.  DATE OF BIRTH:  03/02/1941   DATE OF PROCEDURE:  03/19/2004  DATE OF DISCHARGE:                                 OPERATIVE REPORT   PREOPERATIVE DIAGNOSIS:  Cervical spondylosis with left cervical  radiculopathy, C4-5, C5-6.   POSTOPERATIVE DIAGNOSIS:  Cervical spondylosis with left cervical  radiculopathy, C4-5, C5-6.   OPERATION PERFORMED:  Anterior cervical decompression C4-5 and C5-6.  Arthrodesis with structural allograft and AphaTek plate fixation, C4 to C6.   SURGEON:  Stefani Dama, M.D.   ASSISTANT:  Cristi Loron, M.D.   ANESTHESIA:  General endotracheal.   INDICATIONS FOR PROCEDURE:  Grant Flores is a 70 year old individual who has  had significant symptoms of neck pain and cervical radiculopathy,  particularly on the left side and some weakness in the deltoids and grip and  biceps on that left side and he has been treating this process for a couple  of years with conservative management having increasing difficulty with  cervical radicular pain.  After careful consideration, he has been advised  regarding surgical intervention.  He is brought to the operating room at  this time to undergo decompression and arthrodesis.   DESCRIPTION OF PROCEDURE:  The procedure was brought to the operating room  and placed on the table in supine position.  After smooth induction of  general endotracheal anesthesia, he was placed in five pounds of halter  traction.  The neck was prepped with DuraPrep and draped in a sterile  fashion.  A transverse incision was made in the midportion of his neck and  the dissection was carried down through the platysma. The plane between the  sternocleidomastoid and the strap muscles was then dissected bluntly and the  prevertebral  space was identified.  The soft tissues overlying the  prevertebral space were opened sharply and care was taken with use of  bipolar cautery in this area to cauterize and divide small blood vessels in  the path.  The first identifiable disk space was noted to be that of C4 and  C5 with localizing radiograph.  Then the prevertebral spaces were cleared  stripping back the longus colli muscle to allow placement of a self-  retaining retractor across the level of C4 and C5.  Ventral osteophytes were  removed with a large Leksell rongeur and then the disk space was entered  using a combination of curettes and rongeurs to remove a significant amount  of severely desiccated and degenerated disk material.  Small curettes were  then used to scrape the end plates free of any remaining attached disk  fragments and the dissection was carried posteriorly into the disk space.  Substantial osteophytic overgrowths were found in the inferior margin of the  body of C4 out on the lateral process and the uncinate processes.  These  were also noted to be quite severely hypertrophied.  These were drilled down  with a 2.3 mm dissecting tool and a high speed air drill and eventually  fragments of bone along this lateral gutter was removed.  The bone was saved  for later packing in the allograft.  The end plates were then cleared.  The  ligament was taken up and then cleared on one side and then the other.  The  foramen were found to be patent and sounded on each side.  With hemostasis  being achieved, a 7 mm TranZgraft was contoured to its final shape and  position, filled with the patient's autograft and then also filled with DBX  putty and placed into the interspace.  Attention was then turned to C5-6  where a similar procedure was carried out.  Here the ventral osteophytes  were noted to be rather small.  However, the dorsal osteophytes were noted  to be substantially larger than they were at C4 and C5. These  were cleared  in a similar fashion.  The disk space was emptied and opened to allow  placement of a 7 mm TranZgraft allograft which was fashioned to its final  shape and size.  This was also packed with the patient's own bone that was  obtained from the lateral uncinate process drilling and also this was mixed  with DBX putty.  Once the grafts were placed, traction was removed from the  neck.  The soft tissues were inspected and then with adequate dissection, 37  mm standard sized AlphaTek plate was placed in position and fixed with six  fixed angle 14 mm screws.  Final localizing radiograph identified good  position of the construct.  Hemostasis was then meticulously obtained in the  lateral recesses and then the platysma was closed with 3-0 Vicryl in  interrupted fashion.  3-0 Vicryl was used to close the subcuticular tissues  and a Dermabond like substance was used on the skin.  The patient tolerated  the procedure well and was returned to recovery room in stable condition.       HJE/MEDQ  D:  03/19/2004  T:  03/19/2004  Job:  [id number]

## 2010-12-21 ENCOUNTER — Encounter: Payer: Self-pay | Admitting: Internal Medicine

## 2010-12-28 ENCOUNTER — Other Ambulatory Visit: Payer: Self-pay | Admitting: Geriatric Medicine

## 2010-12-28 DIAGNOSIS — M48061 Spinal stenosis, lumbar region without neurogenic claudication: Secondary | ICD-10-CM

## 2010-12-31 ENCOUNTER — Ambulatory Visit
Admission: RE | Admit: 2010-12-31 | Discharge: 2010-12-31 | Disposition: A | Discharge status: Home / Self Care | Payer: Medicare Other | Source: Ambulatory Visit | Attending: Geriatric Medicine | Admitting: Geriatric Medicine

## 2010-12-31 DIAGNOSIS — M48061 Spinal stenosis, lumbar region without neurogenic claudication: Secondary | ICD-10-CM

## 2010-12-31 MED ORDER — METHYLPREDNISOLONE ACETATE 40 MG/ML INJ SUSP (RADIOLOG
120.0000 mg | Freq: Once | INTRAMUSCULAR | Status: AC
  Administered 2010-12-31: 120 mg via EPIDURAL

## 2010-12-31 MED ORDER — IOHEXOL 180 MG/ML  SOLN
1.0000 mL | Freq: Once | INTRAMUSCULAR | Status: AC | PRN
  Administered 2010-12-31: 1 mL via EPIDURAL

## 2011-03-08 LAB — COMPREHENSIVE METABOLIC PANEL
BUN: 5 — ABNORMAL LOW
Calcium: 9
Creatinine, Ser: 0.8
Glucose, Bld: 100 — ABNORMAL HIGH
Total Protein: 7.3

## 2011-03-08 LAB — BASIC METABOLIC PANEL
Chloride: 108
Creatinine, Ser: 0.82
GFR calc Af Amer: >60
Potassium: 4

## 2011-03-08 LAB — I-STAT 8, (EC8 V) (CONVERTED LAB)
BUN: 7
Chloride: 108
HCT: 47
Hemoglobin: 16
Operator id: 146091
Sodium: 138

## 2011-03-08 LAB — TSH: TSH: 1.665

## 2011-03-08 LAB — CBC
MCV: 89.2
RBC: 4.65
WBC: 10.6 — ABNORMAL HIGH

## 2011-03-08 LAB — LIPID PANEL
HDL: 36 — ABNORMAL LOW
Triglycerides: 60

## 2011-03-08 LAB — TROPONIN I: Troponin I: 0.01

## 2011-03-08 LAB — CARDIAC PANEL(CRET KIN+CKTOT+MB+TROPI): Troponin I: 0.03

## 2011-03-08 LAB — CK TOTAL AND CKMB (NOT AT ARMC)
CK, MB: 3.6
Total CK: 143

## 2011-03-27 LAB — LIPID PANEL
Cholesterol: 180
LDL Cholesterol: 115 — ABNORMAL HIGH
Triglycerides: 113
VLDL: 23

## 2011-03-27 LAB — BASIC METABOLIC PANEL
BUN: 10
BUN: 11
BUN: 12
CO2: 24
CO2: 26
Chloride: 106
Chloride: 106
Chloride: 107
Creatinine, Ser: 0.73
Creatinine, Ser: 0.85
Potassium: 3.6
Potassium: 3.7

## 2011-03-27 LAB — I-STAT 8, (EC8 V) (CONVERTED LAB)
Acid-Base Excess: 3 — ABNORMAL HIGH
Chloride: 103
HCT: 50
Operator id: 284251
Potassium: 3.3 — ABNORMAL LOW
Sodium: 139
TCO2: 29

## 2011-03-27 LAB — DIFFERENTIAL
Eosinophils Absolute: 0.1
Eosinophils Relative: 0
Lymphs Abs: 3.6 — ABNORMAL HIGH
Monocytes Absolute: 1.4 — ABNORMAL HIGH
Monocytes Relative: 7

## 2011-03-27 LAB — CARDIAC PANEL(CRET KIN+CKTOT+MB+TROPI)
CK, MB: 159.8 — ABNORMAL HIGH
CK, MB: 316.6 — ABNORMAL HIGH
CK, MB: 499.7 — ABNORMAL HIGH
Relative Index: 8.2 — ABNORMAL HIGH
Relative Index: 9.3 — ABNORMAL HIGH
Total CK: 1937 — ABNORMAL HIGH
Troponin I: >100

## 2011-03-27 LAB — HEMOGLOBIN A1C: Hgb A1c MFr Bld: 5.6

## 2011-03-27 LAB — CBC
HCT: 41.5
HCT: 43
HCT: 45.9
Hemoglobin: 15.6
MCHC: 33.5
MCHC: 34.1
MCV: 89.7
MCV: 89.8
MCV: 89.8
MCV: 90
Platelets: 222
Platelets: 228
Platelets: 240
Platelets: 319
RBC: 4.78
RBC: 5.12
RDW: 14.1 — ABNORMAL HIGH
WBC: 11.9 — ABNORMAL HIGH
WBC: 14 — ABNORMAL HIGH
WBC: 21.1 — ABNORMAL HIGH

## 2011-03-27 LAB — POCT I-STAT CREATININE: Creatinine, Ser: 0.9

## 2011-03-27 LAB — POCT CARDIAC MARKERS: Operator id: 284251

## 2011-03-28 ENCOUNTER — Telehealth: Payer: Self-pay | Admitting: Internal Medicine

## 2011-03-28 ENCOUNTER — Telehealth: Payer: Self-pay | Admitting: *Deleted

## 2011-03-28 NOTE — Telephone Encounter (Signed)
Walk In Pt Form " Pt Dropped off form from Sports Medicine" for Surgical Clearance sent to Message Nurse 03/28/11/km

## 2011-03-28 NOTE — Telephone Encounter (Signed)
Walk in form received to triage regarding the patient needing clearance for Right total knee arthroplasty faxed to Dr. Madelon Lips. The patient has not been seen since 04/2010. I have left a message for the patient to call. He will need to be seen for clearance. The Pre-op clearance form will be left on top of the message cart.

## 2011-03-28 NOTE — Telephone Encounter (Signed)
Pt returning call to [facility]. Please return pt call.

## 2011-03-28 NOTE — Telephone Encounter (Signed)
Brought form in for clearance for total (R) knee arthroplasty w/Dr. Madelon Lips. Advised he would need to come in for an appointment. Made him an app w/ Lilian Coma on 10/18.  He is agreeable w/this. Will give form to [place].

## 2011-04-03 ENCOUNTER — Encounter: Payer: Self-pay | Admitting: Physician Assistant

## 2011-04-03 ENCOUNTER — Ambulatory Visit (INDEPENDENT_AMBULATORY_CARE_PROVIDER_SITE_OTHER): Payer: Medicare Other | Admitting: Physician Assistant

## 2011-04-03 DIAGNOSIS — M1710 Unilateral primary osteoarthritis, unspecified knee: Secondary | ICD-10-CM

## 2011-04-03 DIAGNOSIS — I10 Essential (primary) hypertension: Secondary | ICD-10-CM

## 2011-04-03 DIAGNOSIS — I2510 Atherosclerotic heart disease of native coronary artery without angina pectoris: Secondary | ICD-10-CM

## 2011-04-03 DIAGNOSIS — Z01810 Encounter for preprocedural cardiovascular examination: Secondary | ICD-10-CM

## 2011-04-03 DIAGNOSIS — F17200 Nicotine dependence, unspecified, uncomplicated: Secondary | ICD-10-CM

## 2011-04-03 DIAGNOSIS — M1711 Unilateral primary osteoarthritis, right knee: Secondary | ICD-10-CM

## 2011-04-03 HISTORY — PX: CARDIOVASCULAR STRESS TEST: SHX262

## 2011-04-03 MED ORDER — ATORVASTATIN CALCIUM 80 MG PO TABS: 80.0000 mg | ORAL_TABLET | Freq: Every day | ORAL | Status: DC

## 2011-04-03 NOTE — Assessment & Plan Note (Signed)
Uncontrolled.  I have given him samples of Benicar/HCT 20/12.5 mg QD.  He will start this and check a BMET and BP in one week.

## 2011-04-03 NOTE — Progress Notes (Signed)
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    He has a history of coronary artery disease with previous inferolateral ST- elevation myocardial infarction treated with PTCA and bare metal stenting of his left circumflex in October 2008.  Ejection fraction was 45%.  He had mild nonobstructive disease elsewhere.  He also has a history of hypertension, hyperlipidemia, obesity, and COPD with ongoing tobacco use. Myoview March 2009 showed an EF of 56% with previous large inferolateral infarct.  It was thought that the EF was overestimated.  There was no ischemia. Last seen by Dr. Gala Flores in 04/2010.  He needs right TKR with Dr. Madelon Flores.  He is very limited by his knee pain.  He is clearly unable to achieve 4 METS.  Also having problems with finances.  Stopped taking Benicar on his own.  BP very high today.  He is still smoking occasional cigarettes.  Denies chest pain or dyspnea at rest.  No orthopnea, PND or edema.  No syncope.  No palpitations.    Past Medical History  Diagnosis Date  . CAD (coronary artery disease) October 2008    s/p inferolateral STEMI.... PCI with BMS to LCX. (cath with EF 50%, mod inf HK, RCA 40%, small oD3 70%, OM1 40%, CFX occluded - tx with PCI);  echo 10/08: EF 45%, mild RVE;  Myoview 2009. Inferior infarct. no ischemia, EF 45%  . Hypertension   . High cholesterol   . COPD (chronic obstructive pulmonary disease)     with ongoing tobacco  . Obesity     Current Outpatient Prescriptions  Medication Sig Dispense Refill  . aspirin EC 325 MG tablet Take 325 mg by mouth daily.        [name] atorvastatin (LIPITOR) 80 MG tablet Take 1 tablet (80 mg total) by mouth daily.  30 tablet  11  . carvedilol (COREG) 6.25 MG tablet Take 1 tablet (6.25 mg total) by mouth 2 (two) times daily.  60 tablet  11  . meloxicam (MOBIC) 7.5 MG tablet Take 7.5 mg by mouth as needed.       [name] DISCONTD:  atorvastatin (LIPITOR) 80 MG tablet Take 80 mg by mouth daily.        [name] olmesartan-hydrochlorothiazide (BENICAR HCT) 20-12.5 MG per tablet Take 1 tablet by mouth daily.        Allergies: No Known Allergies  Social Hx:  Continues to smoke.  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  Married.  ROS:  Please see the history of present illness.  All other systems reviewed and negative.   Vital Signs: BP 186/100  Pulse 63  Ht 5\' 7"  (1.702 m)  Wt 189 lb 6.4 oz (85.911 kg)  BMI 29.66 kg/m2  PHYSICAL EXAM: Well nourished, well developed, in no acute distress HEENT: normal Neck: no JVD Vascular: no carotid bruits Cardiac:  normal S1, S2; RRR; no murmur Lungs: decreased breath sounds bilaterally with faint expiratory wheezes throughout Abd: soft, nontender, no hepatomegaly Ext: no edema Skin: warm and dry Neuro:  CNs 2-12 intact, no focal abnormalities noted Psych: flat affect  EKG:  NSR, HR 63, LAD, NSSTTW changes  ASSESSMENT AND PLAN:

## 2011-04-03 NOTE — Patient Instructions (Addendum)
Your physician wants you to follow-up in: 6 MONTHS TO SEE DR. Gala Romney. You will receive a reminder letter in the mail two months in advance. If you don't receive a letter, please call our office to schedule the follow-up appointment.  Your physician has requested that you have a lexiscan myoview 04/09/11 DX V72.81 SURG CLEARANCE, 414.01 CAD. For further information please visit [email or web address] Please follow instruction sheet, as given. Your physician recommends that you return for lab work in: 04/09/11 BMET 401.1   Your physician has recommended you make the following change in your medication: START BENICAR/HCTZ 20/12.5 MG 1 TABLET DAILY     DO NOT STOP YOUR COREG, MAKE SURE YOU TAKE YOUR COREG THE DAY OF YOUR SUGERY PER SCOTT WEAVER, PA-C

## 2011-04-03 NOTE — Assessment & Plan Note (Signed)
I counseled him on need to quit.

## 2011-04-03 NOTE — Assessment & Plan Note (Addendum)
He is unable to achieve 4 METS.  It has been 3 years since his last stress test.  He is still smoking and his BP is uncontrolled due to noncompliance with medications due to finances.  Proceed with Lexiscan Myoview stress test prior to clearing him for surgery.  If low risk, he can proceed with TKR.  I recommend he continue coreg throughout the perioperative period.  Follow up with Dr. Gala Romney in 6 months or sooner if stress test abnormal.     04/10/11: Myoview with prior inferolateral scar, no ischemia and EF 42%.  This is consistent with prior testing without significant change.   According to [facility] and AHA guidelines, the patient requires no further cardiac workup prior to their noncardiac surgery.  The patient should be at acceptable risk.  Our service is available as necessary in the perioperative period.  Continue beta blocker in perioperative period.  Close attention needs to be paid to fluid status with reduced EF.  Tereso Newcomer, PA-C

## 2011-04-03 NOTE — Assessment & Plan Note (Signed)
Right knee TKR pending with Dr. Madelon Lips.  Proceed with stress testing as noted prior to clearing for surgery.

## 2011-04-09 ENCOUNTER — Ambulatory Visit (HOSPITAL_COMMUNITY): Payer: Medicare Other | Attending: Cardiovascular Disease | Admitting: Radiology

## 2011-04-09 ENCOUNTER — Other Ambulatory Visit (INDEPENDENT_AMBULATORY_CARE_PROVIDER_SITE_OTHER): Payer: Medicare Other | Admitting: *Deleted

## 2011-04-09 DIAGNOSIS — I2510 Atherosclerotic heart disease of native coronary artery without angina pectoris: Secondary | ICD-10-CM

## 2011-04-09 DIAGNOSIS — I10 Essential (primary) hypertension: Secondary | ICD-10-CM

## 2011-04-09 DIAGNOSIS — R9431 Abnormal electrocardiogram [ECG] [EKG]: Secondary | ICD-10-CM

## 2011-04-09 DIAGNOSIS — Z01810 Encounter for preprocedural cardiovascular examination: Secondary | ICD-10-CM

## 2011-04-09 LAB — BASIC METABOLIC PANEL
Calcium: 9.1 mg/dL (ref 8.4–10.5)
GFR: 112.78 mL/min (ref >60.00)
Sodium: 139 mEq/L (ref 135–145)

## 2011-04-09 MED ORDER — TECHNETIUM TC 99M TETROFOSMIN IV KIT
33.0000 | PACK | Freq: Once | INTRAVENOUS | Status: AC | PRN
  Administered 2011-04-09: 33 via INTRAVENOUS

## 2011-04-09 MED ORDER — TECHNETIUM TC 99M TETROFOSMIN IV KIT
11.0000 | PACK | Freq: Once | INTRAVENOUS | Status: AC | PRN
  Administered 2011-04-09: 11 via INTRAVENOUS

## 2011-04-09 MED ORDER — REGADENOSON 0.4 MG/5ML IV SOLN
0.4000 mg | Freq: Once | INTRAVENOUS | Status: AC
  Administered 2011-04-09: 0.4 mg via INTRAVENOUS

## 2011-04-09 NOTE — Progress Notes (Signed)
[facility] SITE 3 NUCLEAR MED [address] [place] [address] [phone]  Cardiology Nuclear Med Grant Flores is a 70 y.o. male [id number] Nov 05, 1940   Nuclear Med Background Indication for Stress Test:  Evaluation for Ischemia, PTCA/Stent Patency, Abnormal EKG and Pending (R) TKR by Dr. Frederico Hamman History:  Asthma, COPD and 10/08 STEMI>PTCA/Stent-LCFX, EF=50%; 10/08 Echo:EF=45$, mild RVE; '09 OZH:YQMVHQIO infero-lateral infarct, no ischemia, EF=56%  Cardiac Risk Factors: Family History - CAD (not premature), Hypertension, Lipids, Obesity and Smoker  Symptoms:  DOE   Nuclear Pre-Procedure Caffeine/Decaff Intake:  9:00pm NPO After: 10:00pm   Lungs:  Slight expiratory wheezes, cleared with cough.  O2 SAT 95% on RA. IV 0.9% NS with Angio Cath:  20g  IV Site: R Hand  IV Started by:  Cathlyn Parsons, RN  Chest Size (in):  42 Cup Size: n/a  Height: 5\' 7"  (1.702 m)  Weight:  208 lb (94.348 kg)  BMI:  Body mass index is 32.58 kg/(m^2). Tech Comments:  Coreg held x 15 hours    Nuclear Med Study 1 or 2 day study: 1 day  Stress Test Type:  Lexiscan  Reading MD: Charlton Haws, MD  Order Authorizing Provider:  Arvilla Meres, MD; Frederico Hamman, MD  Resting Radionuclide: Technetium 16m Tetrofosmin  Resting Radionuclide Dose: 11.0 mCi   Stress Radionuclide:  Technetium 86m Tetrofosmin  Stress Radionuclide Dose: 33.0 mCi           Stress Protocol Rest HR: 74 Stress HR: 98  Rest BP: 148/99 Stress BP: 150/110 (while patient talking and laughing, 3-minutes in recovery)  Exercise Time (min): n/a METS: n/a   Predicted Max HR: 150 bpm % Max HR: 65.33 bpm Rate Pressure Product: 96295   Dose of Adenosine (mg):  n/a Dose of Lexiscan: 0.4 mg  Dose of Atropine (mg): n/a Dose of Dobutamine: n/a mcg/kg/min (at max HR)  Stress Test Technologist: Smiley Houseman, CMA-N  Nuclear Technologist:  Domenic Polite, CNMT     Rest Procedure:  Myocardial perfusion  imaging was performed at rest 45 minutes following the intravenous administration of Technetium 39m Tetrofosmin.  Rest ECG: Prior ILWMI and rare PVC.  Stress Procedure:  The patient received IV Lexiscan 0.4 mg over 15-seconds.  Technetium 41m Tetrofosmin injected at 30-seconds.  There were no significant changes with Lexiscan.  Quantitative spect images were obtained after a 45 minute delay.  Stress ECG: No significant change from baseline ECG  QPS Raw Data Images:  Normal; no motion artifact; normal heart/lung ratio. Stress Images:  There is decreased uptake in the lateral wall. Rest Images:  There is decreased uptake in the lateral wall. Subtraction (SDS):  There is a fixed defect that is most consistent with a previous infarction. Transient Ischemic Dilatation (Normal <1.22):  0.90 Lung/Heart Ratio (Normal <0.45):  0.36  Quantitative Gated Spect Images QGS EDV:  139 ml QGS ESV:  80 ml QGS cine images:  Decreased LV funtion QGS EF: 42%  Impression Exercise Capacity:  Lexiscan with no exercise. BP Response:  Normal blood pressure response. Clinical Symptoms:  No chest pain. ECG Impression:  No significant ST segment change suggestive of ischemia. Comparison with Prior Nuclear Study: No images to compare  Overall Impression:  Moderate inferolateral wall infarct at mid and basal level with no ischemia.  EF 42%    .Charlton Haws

## 2011-04-11 ENCOUNTER — Other Ambulatory Visit: Payer: Medicare Other | Admitting: *Deleted

## 2011-04-11 ENCOUNTER — Encounter: Payer: Self-pay | Admitting: *Deleted

## 2011-04-15 NOTE — Progress Notes (Signed)
Nuclear report Faxed to Dr. Madelon Lips @ [phone].Scarlette Ar

## 2011-04-23 ENCOUNTER — Other Ambulatory Visit (INDEPENDENT_AMBULATORY_CARE_PROVIDER_SITE_OTHER): Payer: Self-pay | Admitting: General Surgery

## 2011-05-10 ENCOUNTER — Encounter (HOSPITAL_COMMUNITY): Payer: Self-pay | Admitting: Pharmacy Technician

## 2011-05-14 ENCOUNTER — Other Ambulatory Visit: Payer: Self-pay | Admitting: Physician Assistant

## 2011-05-15 ENCOUNTER — Encounter (HOSPITAL_COMMUNITY)
Admission: RE | Admit: 2011-05-15 | Discharge: 2011-05-15 | Disposition: A | Discharge status: Home / Self Care | Payer: Medicare Other | Source: Ambulatory Visit | Attending: Orthopedic Surgery | Admitting: Orthopedic Surgery

## 2011-05-15 ENCOUNTER — Ambulatory Visit (HOSPITAL_COMMUNITY)
Admission: RE | Admit: 2011-05-15 | Discharge: 2011-05-15 | Disposition: A | Discharge status: Home / Self Care | Payer: Medicare Other | Source: Ambulatory Visit | Attending: Anesthesiology | Admitting: Anesthesiology

## 2011-05-15 ENCOUNTER — Other Ambulatory Visit: Payer: Self-pay | Admitting: Physician Assistant

## 2011-05-15 ENCOUNTER — Encounter (HOSPITAL_COMMUNITY): Payer: Self-pay

## 2011-05-15 DIAGNOSIS — Z01812 Encounter for preprocedural laboratory examination: Secondary | ICD-10-CM | POA: Insufficient documentation

## 2011-05-15 DIAGNOSIS — Z01818 Encounter for other preprocedural examination: Secondary | ICD-10-CM | POA: Insufficient documentation

## 2011-05-15 HISTORY — DX: Acute myocardial infarction, unspecified: I21.9

## 2011-05-15 HISTORY — DX: Shortness of breath: R06.02

## 2011-05-15 LAB — COMPREHENSIVE METABOLIC PANEL
ALT: 13 U/L (ref 0–53)
Albumin: 3.9 g/dL (ref 3.5–5.2)
Calcium: 9 mg/dL (ref 8.4–10.5)
GFR calc Af Amer: >90 mL/min (ref >90)
Glucose, Bld: 97 mg/dL (ref 70–99)
Potassium: 4.1 mEq/L (ref 3.5–5.1)
Sodium: 140 mEq/L (ref 135–145)
Total Protein: 6.8 g/dL (ref 6.0–8.3)

## 2011-05-15 LAB — URINALYSIS, ROUTINE W REFLEX MICROSCOPIC
Ketones, ur: NEGATIVE mg/dL
Leukocytes, UA: NEGATIVE
Nitrite: NEGATIVE
Protein, ur: NEGATIVE mg/dL
pH: 6 (ref 5.0–8.0)

## 2011-05-15 LAB — APTT: aPTT: 33 seconds (ref 24–37)

## 2011-05-15 LAB — DIFFERENTIAL
Basophils Absolute: 0 10*3/uL (ref 0.0–0.1)
Basophils Relative: 0 % (ref 0–1)
Eosinophils Absolute: 0.4 10*3/uL (ref 0.0–0.7)
Eosinophils Relative: 5 % (ref 0–5)
Lymphs Abs: 3.6 10*3/uL (ref 0.7–4.0)
Neutrophils Relative %: 47 % (ref 43–77)

## 2011-05-15 LAB — TYPE AND SCREEN: ABO/RH(D): O POS

## 2011-05-15 LAB — CBC
MCH: 30.8 pg (ref 26.0–34.0)
MCV: 88.8 fL (ref 78.0–100.0)
Platelets: 230 10*3/uL (ref 150–400)
RDW: 14.1 % (ref 11.5–15.5)
WBC: 9 10*3/uL (ref 4.0–10.5)

## 2011-05-15 LAB — SURGICAL PCR SCREEN: MRSA, PCR: NEGATIVE

## 2011-05-15 MED ORDER — CHLORHEXIDINE GLUCONATE 4 % EX LIQD: 60.0000 mL | Freq: Once | CUTANEOUS | Status: DC

## 2011-05-15 NOTE — Pre-Procedure Instructions (Signed)
20 Grant Flores  05/15/2011   Your procedure is scheduled on:  Friday May 24, 2011  Report to [facility] at 5:30 AM.  Call this number if you have problems the morning of surgery: [phone]   Remember:   Do not eat food:After Midnight.  May have clear liquids: up to 4 Hours before arrival. (1:30 AM)  Clear liquids include soda, tea, black coffee, [place] or grape juice, broth.  Take these medicines the morning of surgery with A SIP OF WATER: CARVEDILOL   Do not wear jewelry, make-up or nail polish.  Do not wear lotions, powders, or perfumes. You may wear deodorant.  Do not shave 48 hours prior to surgery.  Do not bring valuables to the hospital.  Contacts, dentures or bridgework may not be worn into surgery.  Leave suitcase in the car. After surgery it may be brought to your room.  For patients admitted to the hospital, checkout time is 11:00 AM the day of discharge.   Patients discharged the day of surgery will not be allowed to drive home.  Name and phone number of your driver: N/A  Special Instructions: Incentive Spirometry - Practice and bring it with you on the day of surgery. and CHG Shower Use Special Wash: 1/2 bottle night before surgery and 1/2 bottle morning of surgery.   Please read over the following fact sheets that you were given: Pain Booklet, Coughing and Deep Breathing, Blood Transfusion Information, Total Joint Packet, MRSA Information and Surgical Site Infection Prevention, Hibiclens and Mupirocin Ointment Sheet

## 2011-05-15 NOTE — H&P (Signed)
 NAME: Grant Flores MRN: #[id number] DATE: May 07, 2011 DOB: 08/07/1940  COMPLAINT:     Preoperative H&P visit for right total knee arthroplasty.  HPI:     The patient is a 70-year-old male with history of right knee end-stage osteoarthritis and also with coronary artery disease, history of myocardial infarction in 2008, stent placement, hypertension, hyperlipidemia, obesity, and chronic obstructive pulmonary disease with ongoing tobacco use.  He has failed conservative treatment with oral anti-inflammatories, aspiration, corticosteroid injection, and gait aids.  He has previous x-rays documenting medial compartment collapse, patellofemoral arthrosis, and varus arthrosis.  It is significantly affecting his quality of life and activities of daily living.  He has the most pain when weightbearing.  It will occasionally awaken him from sleep at night.  The risks and benefits of total knee arthroplasty were discussed at his previous visit and he wishes to proceed.  He has obtained medical clearance from his primary care physician, Dr. Hal Stoneking.  He has obtained cardiac clearance from his cardiologist, Dr. Daniel Bensimhon.  He underwent stress test on 04/09/11 showing 42% EF.  CURRENT MEDICATIONS:     Aspirin 325 mg daily, Mobic 7.5 mg daily, Coreg 6.25 mg b.i.d., and Benicar HCT 20-12.5 mg daily.  ALLERGIES:     NKDA.  PAST MEDICAL HISTORY:     Coronary artery disease, history of myocardial infarction in 2008 with stents placed, hypertension, hyperlipidemia, obesity, chronic obstructive pulmonary disease with ongoing tobacco use, and end-stage osteoarthritis right knee.  PAST SURGICAL HISTORY:     Unknown neck surgery in 2008.  Stent placement.  ROS:    Systemic review is positive for glasses, contacts, upper and lower dentures, myocardial infarction, and hypertension.  Please refer to patient information form for details.  FAMILY HISTORY:    Positive for diabetes in sister.  SOCIAL HISTORY:     The pack is a pack per day smoker for 30 years.  He does not drink alcohol.  He is married and lives with his wife in a one-story residence.    EXAM:     The patient is seated in the examination room in no acute distress.  He is alert, oriented, and appears appropriate age.   HT:  5'9"   WT:  200 pounds  BMI:  29.5  TEMP:  97.6 degrees  BP:  168/89    P:  77     R:  18  Skin:   No rashes, no lesions. HEENT:   PERRLA.  He does have upper and lower dentures.  His neck is supple with good range of motion.   Chest:   Decreased breath sounds bilaterally with faint expiratory wheezes throughout, left greater than right.   Heart:   Regular rate and rhythm.  No signs of murmurs.   Abdomen:   Soft, non tender.  Active bowel sounds in all four quadrants. Rectal:    Not indicated for surgery. Muscles:     No tenderness, no muscle atrophy. Neuro:   Cranial nerves grossly intact. Musculoskeletal:    Examination of the right lower extremity shows he is neurovascularly intact.  He has tenderness to palpation over the medial and lateral joint lines of the knee.  Range of motion from 0 degrees to 110 degrees.  Significant patellofemoral crepitus.  Mild varus inclination of the knee.  Ambulates with an antalgic gait.  X-RAYS:     No images were obtained today.  X-rays taken of the right knee on 03/04/11 show medial compartment collapse,   patellofemoral arthrosis, and varus arthrosis.  IMPRESSION:      1. Right knee osteoarthritis, end stage. 2. Coronary artery disease.  History of myocardial infarction in 2008 with stents. 3. Hypertension. 4. Hyperlipidemia. 5. Obesity. 6. Chronic obstructive pulmonary disease with ongoing tobacco use.  RECOMMENDATIONS:     The risks and benefits of right total knee arthroplasty were discussed again today.  The patient still wishes to proceed with the surgery which is currently scheduled for 05/24/11.  He has obtained medical and cardiac clearance from his primary care  physician and cardiologist.  The patient was given preoperative instructions.  He was also given an appointment for preoperative admission to [facility] on 05/15/11.  He will be placed on Lovenox postoperatively for DVT prophylaxis.  He will be given perioperative antibiotic Ancef.    He is planning to go home following the surgery pending clearance from inpatient therapist.  Home physical therapy has been arranged with [facility].  Due to his multiple medical comorbidities, we will likely consult a hospitalist for postoperative care while an inpatient.   Josh Walt Geathers P.A.-C/[id number]  Auto-Authenticated by Josh Kelton Bultman P.A.-C 

## 2011-05-15 NOTE — Progress Notes (Signed)
Cardiac History to be reviewed by Shonna Chock, PA.

## 2011-05-17 NOTE — Consult Note (Signed)
Anesthesia:  Patient is a 70 year old male for right TKA.  Hx + for CAD/MI s/p BMS to left CX '08, HTN, hypercholesterolemia, COPD with ongoing smoking, asthma, and obesity.  His Cardiologist is Dr. Gala Romney.  He saw Tereso Newcomer, PA-C at [facility] on 04/03/11 for cardiac clearance.  He ultimately had a stress test on 04/09/11 showing moderate inferolateral wall infarct at mid and basal level with no ischemia.  His EF was 42%.  He did receive Cardiac clearance for this procedure.  04/03/11 EKG noted showing NSR, LAD, lateral infarct, possible inferior infarct.  Preoperative labs and CXR are acceptable.  Plan to proceed.

## 2011-05-23 MED ORDER — CEFAZOLIN SODIUM-DEXTROSE 2-3 GM-% IV SOLR
2.0000 g | INTRAVENOUS | Status: DC
  Filled 2011-05-23 (×2): qty 50

## 2011-05-24 ENCOUNTER — Inpatient Hospital Stay (HOSPITAL_COMMUNITY): Payer: Medicare Other

## 2011-05-24 ENCOUNTER — Inpatient Hospital Stay (HOSPITAL_COMMUNITY)
Admission: RE | Admit: 2011-05-24 | Discharge: 2011-05-27 | DRG: 470 | Disposition: A | Discharge status: Home Health | Payer: Medicare Other | Source: Ambulatory Visit | Attending: Orthopedic Surgery | Admitting: Orthopedic Surgery

## 2011-05-24 ENCOUNTER — Encounter (HOSPITAL_COMMUNITY): Payer: Self-pay | Admitting: *Deleted

## 2011-05-24 ENCOUNTER — Inpatient Hospital Stay (HOSPITAL_COMMUNITY): Payer: Medicare Other | Admitting: Vascular Surgery

## 2011-05-24 ENCOUNTER — Encounter (HOSPITAL_COMMUNITY)
Admission: RE | Disposition: A | Discharge status: Home Health | Payer: Self-pay | Source: Ambulatory Visit | Attending: Orthopedic Surgery

## 2011-05-24 ENCOUNTER — Encounter (HOSPITAL_COMMUNITY): Payer: Self-pay | Admitting: Vascular Surgery

## 2011-05-24 DIAGNOSIS — E785 Hyperlipidemia, unspecified: Secondary | ICD-10-CM | POA: Diagnosis present

## 2011-05-24 DIAGNOSIS — I2510 Atherosclerotic heart disease of native coronary artery without angina pectoris: Secondary | ICD-10-CM | POA: Diagnosis present

## 2011-05-24 DIAGNOSIS — D62 Acute posthemorrhagic anemia: Secondary | ICD-10-CM | POA: Diagnosis not present

## 2011-05-24 DIAGNOSIS — Z9861 Coronary angioplasty status: Secondary | ICD-10-CM

## 2011-05-24 DIAGNOSIS — F17200 Nicotine dependence, unspecified, uncomplicated: Secondary | ICD-10-CM | POA: Diagnosis present

## 2011-05-24 DIAGNOSIS — J441 Chronic obstructive pulmonary disease with (acute) exacerbation: Secondary | ICD-10-CM | POA: Diagnosis not present

## 2011-05-24 DIAGNOSIS — I252 Old myocardial infarction: Secondary | ICD-10-CM

## 2011-05-24 DIAGNOSIS — I509 Heart failure, unspecified: Secondary | ICD-10-CM | POA: Diagnosis present

## 2011-05-24 DIAGNOSIS — Z7982 Long term (current) use of aspirin: Secondary | ICD-10-CM

## 2011-05-24 DIAGNOSIS — I5020 Unspecified systolic (congestive) heart failure: Secondary | ICD-10-CM | POA: Diagnosis present

## 2011-05-24 DIAGNOSIS — E669 Obesity, unspecified: Secondary | ICD-10-CM | POA: Diagnosis present

## 2011-05-24 DIAGNOSIS — R1110 Vomiting, unspecified: Secondary | ICD-10-CM | POA: Diagnosis not present

## 2011-05-24 DIAGNOSIS — I10 Essential (primary) hypertension: Secondary | ICD-10-CM | POA: Diagnosis present

## 2011-05-24 DIAGNOSIS — M1710 Unilateral primary osteoarthritis, unspecified knee: Principal | ICD-10-CM | POA: Diagnosis present

## 2011-05-24 HISTORY — PX: TOTAL KNEE ARTHROPLASTY: SHX125

## 2011-05-24 LAB — COMPREHENSIVE METABOLIC PANEL
ALT: 12 U/L (ref 0–53)
AST: 17 U/L (ref 0–37)
Albumin: 3.4 g/dL — ABNORMAL LOW (ref 3.5–5.2)
Alkaline Phosphatase: 49 U/L (ref 39–117)
Calcium: 8.3 mg/dL — ABNORMAL LOW (ref 8.4–10.5)
GFR calc Af Amer: >90 mL/min (ref >90)
Glucose, Bld: 168 mg/dL — ABNORMAL HIGH (ref 70–99)
Potassium: 3.7 mEq/L (ref 3.5–5.1)
Sodium: 137 mEq/L (ref 135–145)
Total Protein: 6 g/dL (ref 6.0–8.3)

## 2011-05-24 LAB — CBC
Hemoglobin: 12.6 g/dL — ABNORMAL LOW (ref 13.0–17.0)
MCH: 30 pg (ref 26.0–34.0)
MCHC: 33.6 g/dL (ref 30.0–36.0)
Platelets: 219 10*3/uL (ref 150–400)

## 2011-05-24 SURGERY — ARTHROPLASTY, KNEE, TOTAL
Anesthesia: General | Site: Knee | Laterality: Right | Wound class: Clean

## 2011-05-24 MED ORDER — BUPIVACAINE-EPINEPHRINE PF 0.5-1:200000 % IJ SOLN
INTRAMUSCULAR | Status: DC | PRN
  Administered 2011-05-24: 150 mg

## 2011-05-24 MED ORDER — FLEET ENEMA 7-19 GM/118ML RE ENEM: 1.0000 | ENEMA | Freq: Once | RECTAL | Status: AC | PRN

## 2011-05-24 MED ORDER — IPRATROPIUM BROMIDE 0.02 % IN SOLN: 0.5000 mg | Freq: Four times a day (QID) | RESPIRATORY_TRACT | Status: DC | PRN

## 2011-05-24 MED ORDER — DROPERIDOL 2.5 MG/ML IJ SOLN
0.6250 mg | INTRAMUSCULAR | Status: DC | PRN
Start: 2011-05-24 — End: 2011-05-24

## 2011-05-24 MED ORDER — ONDANSETRON HCL 4 MG/2ML IJ SOLN
4.0000 mg | Freq: Four times a day (QID) | INTRAMUSCULAR | Status: DC | PRN
  Administered 2011-05-24: 4 mg via INTRAVENOUS
  Filled 2011-05-24: qty 2

## 2011-05-24 MED ORDER — ACETAMINOPHEN 10 MG/ML IV SOLN
1000.0000 mg | Freq: Four times a day (QID) | INTRAVENOUS | Status: AC
  Administered 2011-05-24 – 2011-05-25 (×4): 1000 mg via INTRAVENOUS
  Filled 2011-05-24 (×4): qty 100

## 2011-05-24 MED ORDER — HYDROMORPHONE HCL PF 1 MG/ML IJ SOLN
0.2500 mg | INTRAMUSCULAR | Status: DC | PRN
  Administered 2011-05-24 (×2): 0.5 mg via INTRAVENOUS

## 2011-05-24 MED ORDER — METOCLOPRAMIDE HCL 5 MG PO TABS
5.0000 mg | ORAL_TABLET | Freq: Three times a day (TID) | ORAL | Status: DC | PRN
  Filled 2011-05-24: qty 2

## 2011-05-24 MED ORDER — ACETAMINOPHEN 10 MG/ML IV SOLN: 1000.0000 mg | Freq: Four times a day (QID) | INTRAVENOUS | Status: DC

## 2011-05-24 MED ORDER — SIMVASTATIN 20 MG PO TABS
20.0000 mg | ORAL_TABLET | Freq: Every day | ORAL | Status: DC
  Administered 2011-05-25 – 2011-05-26 (×2): 20 mg via ORAL
  Filled 2011-05-24 (×3): qty 1

## 2011-05-24 MED ORDER — ALBUTEROL SULFATE (2.5 MG/3ML) 0.083% IN NEBU
INHALATION_SOLUTION | RESPIRATORY_TRACT | Status: DC | PRN
  Administered 2011-05-24: .27 mg via RESPIRATORY_TRACT

## 2011-05-24 MED ORDER — SODIUM CHLORIDE 0.9 % IR SOLN
Status: DC | PRN
  Administered 2011-05-24: 1000 mL

## 2011-05-24 MED ORDER — CEFAZOLIN SODIUM-DEXTROSE 2-3 GM-% IV SOLR
2.0000 g | Freq: Four times a day (QID) | INTRAVENOUS | Status: AC
  Administered 2011-05-24 – 2011-05-25 (×3): 2 g via INTRAVENOUS
  Filled 2011-05-24 (×3): qty 50

## 2011-05-24 MED ORDER — METOCLOPRAMIDE HCL 5 MG/ML IJ SOLN
5.0000 mg | Freq: Three times a day (TID) | INTRAMUSCULAR | Status: DC | PRN
  Administered 2011-05-24: 10 mg via INTRAVENOUS
  Filled 2011-05-24: qty 2

## 2011-05-24 MED ORDER — HYDROMORPHONE HCL PF 1 MG/ML IJ SOLN
0.5000 mg | INTRAMUSCULAR | Status: DC | PRN
  Administered 2011-05-24: 1 mg via INTRAVENOUS
  Filled 2011-05-24: qty 1

## 2011-05-24 MED ORDER — ALBUTEROL SULFATE (5 MG/ML) 0.5% IN NEBU: 2.5000 mg | INHALATION_SOLUTION | RESPIRATORY_TRACT | Status: DC | PRN

## 2011-05-24 MED ORDER — METHOCARBAMOL 500 MG PO TABS
500.0000 mg | ORAL_TABLET | Freq: Four times a day (QID) | ORAL | Status: DC | PRN
  Administered 2011-05-25 (×2): 500 mg via ORAL
  Filled 2011-05-24 (×3): qty 1

## 2011-05-24 MED ORDER — OXYCODONE HCL 5 MG PO TABS
5.0000 mg | ORAL_TABLET | ORAL | Status: DC | PRN
  Administered 2011-05-25 – 2011-05-27 (×5): 10 mg via ORAL
  Filled 2011-05-24 (×5): qty 2

## 2011-05-24 MED ORDER — PANTOPRAZOLE SODIUM 40 MG PO TBEC
40.0000 mg | DELAYED_RELEASE_TABLET | Freq: Every day | ORAL | Status: DC
  Administered 2011-05-25 – 2011-05-27 (×4): 40 mg via ORAL
  Filled 2011-05-24 (×4): qty 1

## 2011-05-24 MED ORDER — OLMESARTAN 10 MG HALF TABLET
10.0000 mg | ORAL_TABLET | Freq: Every day | ORAL | Status: DC
  Administered 2011-05-27: 10 mg via ORAL
  Filled 2011-05-24 (×3): qty 1

## 2011-05-24 MED ORDER — CARVEDILOL 6.25 MG PO TABS
6.2500 mg | ORAL_TABLET | Freq: Two times a day (BID) | ORAL | Status: DC
  Administered 2011-05-25 – 2011-05-27 (×6): 6.25 mg via ORAL
  Filled 2011-05-24 (×8): qty 1

## 2011-05-24 MED ORDER — CHLORHEXIDINE GLUCONATE 4 % EX LIQD: 60.0000 mL | Freq: Once | CUTANEOUS | Status: DC

## 2011-05-24 MED ORDER — METHOCARBAMOL 100 MG/ML IJ SOLN
500.0000 mg | Freq: Four times a day (QID) | INTRAVENOUS | Status: DC | PRN
  Administered 2011-05-24: 500 mg via INTRAVENOUS
  Filled 2011-05-24: qty 5

## 2011-05-24 MED ORDER — SODIUM CHLORIDE 0.9 % IV SOLN
INTRAVENOUS | Status: DC
  Administered 2011-05-25: 03:00:00 via INTRAVENOUS

## 2011-05-24 MED ORDER — SENNOSIDES-DOCUSATE SODIUM 8.6-50 MG PO TABS: 1.0000 | ORAL_TABLET | Freq: Every evening | ORAL | Status: DC | PRN

## 2011-05-24 MED ORDER — PROPOFOL 10 MG/ML IV EMUL
INTRAVENOUS | Status: DC | PRN
  Administered 2011-05-24: 150 mg via INTRAVENOUS

## 2011-05-24 MED ORDER — ACETAMINOPHEN 10 MG/ML IV SOLN
INTRAVENOUS | Status: DC | PRN
  Administered 2011-05-24: 1000 mg via INTRAVENOUS

## 2011-05-24 MED ORDER — CEFAZOLIN SODIUM 1-5 GM-% IV SOLN
INTRAVENOUS | Status: DC | PRN
  Administered 2011-05-24: 2 g via INTRAVENOUS

## 2011-05-24 MED ORDER — MIDAZOLAM HCL 5 MG/5ML IJ SOLN
INTRAMUSCULAR | Status: DC | PRN
  Administered 2011-05-24 (×2): 1 mg via INTRAVENOUS

## 2011-05-24 MED ORDER — NICOTINE 14 MG/24HR TD PT24
14.0000 mg | MEDICATED_PATCH | Freq: Every day | TRANSDERMAL | Status: DC
  Administered 2011-05-25 – 2011-05-27 (×2): 14 mg via TRANSDERMAL
  Filled 2011-05-24 (×5): qty 1

## 2011-05-24 MED ORDER — SODIUM CHLORIDE 0.9 % IV SOLN: INTRAVENOUS | Status: DC

## 2011-05-24 MED ORDER — ENOXAPARIN SODIUM 30 MG/0.3ML ~~LOC~~ SOLN
30.0000 mg | Freq: Two times a day (BID) | SUBCUTANEOUS | Status: DC
  Administered 2011-05-25 – 2011-05-27 (×7): 30 mg via SUBCUTANEOUS
  Filled 2011-05-24 (×7): qty 0.3

## 2011-05-24 MED ORDER — ONDANSETRON HCL 4 MG PO TABS
4.0000 mg | ORAL_TABLET | Freq: Four times a day (QID) | ORAL | Status: DC | PRN
  Administered 2011-05-25: 4 mg via ORAL
  Filled 2011-05-24: qty 1

## 2011-05-24 MED ORDER — ONDANSETRON HCL 4 MG/2ML IJ SOLN
INTRAMUSCULAR | Status: DC | PRN
  Administered 2011-05-24: 4 mg via INTRAVENOUS

## 2011-05-24 MED ORDER — VALSARTAN-HYDROCHLOROTHIAZIDE 80-12.5 MG PO TABS: 1.0000 | ORAL_TABLET | Freq: Every day | ORAL | Status: DC

## 2011-05-24 MED ORDER — CEFAZOLIN SODIUM 1-5 GM-% IV SOLN
INTRAVENOUS | Status: AC
  Administered 2011-05-24: 1 g via INTRAMUSCULAR
  Filled 2011-05-24: qty 100

## 2011-05-24 MED ORDER — SODIUM CHLORIDE 0.9 % IR SOLN
Status: DC | PRN
  Administered 2011-05-24: 3000 mL

## 2011-05-24 MED ORDER — ASPIRIN EC 325 MG PO TBEC
325.0000 mg | DELAYED_RELEASE_TABLET | Freq: Every day | ORAL | Status: DC
  Administered 2011-05-25 – 2011-05-27 (×3): 325 mg via ORAL
  Filled 2011-05-24 (×4): qty 1

## 2011-05-24 MED ORDER — DOCUSATE SODIUM 100 MG PO CAPS
100.0000 mg | ORAL_CAPSULE | Freq: Two times a day (BID) | ORAL | Status: DC
  Administered 2011-05-25 – 2011-05-27 (×6): 100 mg via ORAL
  Filled 2011-05-24 (×8): qty 1

## 2011-05-24 MED ORDER — FLUTICASONE-SALMETEROL 250-50 MCG/DOSE IN AEPB
1.0000 | INHALATION_SPRAY | Freq: Two times a day (BID) | RESPIRATORY_TRACT | Status: DC
  Administered 2011-05-25 – 2011-05-27 (×5): 1 via RESPIRATORY_TRACT
  Filled 2011-05-24: qty 14

## 2011-05-24 MED ORDER — MORPHINE SULFATE 4 MG/ML IJ SOLN: 4.0000 mg | INTRAMUSCULAR | Status: DC | PRN

## 2011-05-24 MED ORDER — HYDROCHLOROTHIAZIDE 12.5 MG PO CAPS
12.5000 mg | ORAL_CAPSULE | Freq: Every day | ORAL | Status: DC
  Administered 2011-05-27: 12.5 mg via ORAL
  Filled 2011-05-24 (×3): qty 1

## 2011-05-24 MED ORDER — MENTHOL 3 MG MT LOZG: 1.0000 | LOZENGE | OROMUCOSAL | Status: DC | PRN

## 2011-05-24 MED ORDER — FENTANYL CITRATE 0.05 MG/ML IJ SOLN
INTRAMUSCULAR | Status: DC | PRN
  Administered 2011-05-24: 25 ug via INTRAVENOUS
  Administered 2011-05-24 (×2): 50 ug via INTRAVENOUS
  Administered 2011-05-24: 100 ug via INTRAVENOUS
  Administered 2011-05-24: 50 ug via INTRAVENOUS
  Administered 2011-05-24: 25 ug via INTRAVENOUS

## 2011-05-24 MED ORDER — LACTATED RINGERS IV SOLN
INTRAVENOUS | Status: DC | PRN
  Administered 2011-05-24 (×2): via INTRAVENOUS

## 2011-05-24 MED ORDER — PHENOL 1.4 % MT LIQD
1.0000 | OROMUCOSAL | Status: DC | PRN
  Filled 2011-05-24: qty 177

## 2011-05-24 MED ORDER — EPHEDRINE SULFATE 50 MG/ML IJ SOLN
INTRAMUSCULAR | Status: DC | PRN
  Administered 2011-05-24 (×3): 10 mg via INTRAVENOUS

## 2011-05-24 SURGICAL SUPPLY — 62 items
BANDAGE ELASTIC 6 VELCRO ST LF (GAUZE/BANDAGES/DRESSINGS) ×1 IMPLANT
BANDAGE ESMARK 6X9 LF (GAUZE/BANDAGES/DRESSINGS) ×1 IMPLANT
BLADE SAGITTAL 25.0X1.19X90 (BLADE) ×2 IMPLANT
BLADE SAW SAG 90X13X1.27 (BLADE) ×2 IMPLANT
BLADE SURG 10 STRL SS (BLADE) ×1 IMPLANT
BLADE SURG 15 STRL LF DISP TIS (BLADE) IMPLANT
BLADE SURG 15 STRL SS (BLADE) ×2
BNDG CMPR 9X6 STRL LF SNTH (GAUZE/BANDAGES/DRESSINGS) ×1
BNDG ESMARK 6X9 LF (GAUZE/BANDAGES/DRESSINGS) ×2
BOWL SMART MIX CTS (DISPOSABLE) ×2 IMPLANT
CAP CEMENTED KNEE ×1 IMPLANT
CEMENT HV SMART SET (Cement) ×4 IMPLANT
CLOTH BEACON ORANGE TIMEOUT ST (SAFETY) ×2 IMPLANT
COVER BACK TABLE 24X17X13 BIG (DRAPES) IMPLANT
COVER SURGICAL LIGHT HANDLE (MISCELLANEOUS) ×2 IMPLANT
CUFF TOURNIQUET SINGLE 34IN LL (TOURNIQUET CUFF) ×2 IMPLANT
CUFF TOURNIQUET SINGLE 44IN (TOURNIQUET CUFF) IMPLANT
DRAPE INCISE IOBAN 66X45 STRL (DRAPES) ×1 IMPLANT
DRAPE ORTHO SPLIT 77X108 STRL (DRAPES) ×4
DRAPE SURG ORHT 6 SPLT 77X108 (DRAPES) ×2 IMPLANT
DRAPE U-SHAPE 47X51 STRL (DRAPES) ×2 IMPLANT
DRSG ADAPTIC 3X8 NADH LF (GAUZE/BANDAGES/DRESSINGS) ×2 IMPLANT
DRSG PAD ABDOMINAL 8X10 ST (GAUZE/BANDAGES/DRESSINGS) ×2 IMPLANT
DURAPREP 26ML APPLICATOR (WOUND CARE) ×2 IMPLANT
ELECT REM PT RETURN 9FT ADLT (ELECTROSURGICAL) ×2
ELECTRODE REM PT RTRN 9FT ADLT (ELECTROSURGICAL) ×1 IMPLANT
EVACUATOR 1/8 PVC DRAIN (DRAIN) ×2 IMPLANT
FACESHIELD LNG OPTICON STERILE (SAFETY) ×3 IMPLANT
FLOSEAL 10ML (HEMOSTASIS) IMPLANT
GLOVE BIOGEL PI IND STRL 8 (GLOVE) ×2 IMPLANT
GLOVE BIOGEL PI INDICATOR 8 (GLOVE) ×2
GLOVE ORTHO TXT STRL SZ7.5 (GLOVE) ×2 IMPLANT
GLOVE SURG ORTHO 8.0 STRL STRW (GLOVE) ×6 IMPLANT
GOWN PREVENTION PLUS XLARGE (GOWN DISPOSABLE) ×5 IMPLANT
GOWN STRL NON-REIN LRG LVL3 (GOWN DISPOSABLE) ×2 IMPLANT
HANDPIECE INTERPULSE COAX TIP (DISPOSABLE) ×2
HOOD SURGICAL BLUE (PROTECTIVE WEAR) ×1 IMPLANT
IMMOBILIZER KNEE 22 UNIV (SOFTGOODS) ×1 IMPLANT
KIT BASIN OR (CUSTOM PROCEDURE TRAY) ×2 IMPLANT
KIT ROOM TURNOVER OR (KITS) ×2 IMPLANT
MANIFOLD NEPTUNE II (INSTRUMENTS) ×2 IMPLANT
NEEDLE 22X1 1/2 (OR ONLY) (NEEDLE) IMPLANT
NS IRRIG 1000ML POUR BTL (IV SOLUTION) ×2 IMPLANT
PACK TOTAL JOINT (CUSTOM PROCEDURE TRAY) ×2 IMPLANT
PAD ARMBOARD 7.5X6 YLW CONV (MISCELLANEOUS) ×4 IMPLANT
PAD CAST 4YDX4 CTTN HI CHSV (CAST SUPPLIES) ×1 IMPLANT
PADDING CAST COTTON 4X4 STRL (CAST SUPPLIES) ×2
PADDING CAST COTTON 6X4 STRL (CAST SUPPLIES) ×2 IMPLANT
SET HNDPC FAN SPRY TIP SCT (DISPOSABLE) ×1 IMPLANT
SPONGE GAUZE 4X4 12PLY (GAUZE/BANDAGES/DRESSINGS) ×2 IMPLANT
STAPLER VISISTAT 35W (STAPLE) ×2 IMPLANT
SUCTION FRAZIER TIP 10 FR DISP (SUCTIONS) ×2 IMPLANT
SUT ETHIBOND NAB CT1 #1 30IN (SUTURE) ×5 IMPLANT
SUT VIC AB 0 CT1 27 (SUTURE) ×4
SUT VIC AB 0 CT1 27XBRD ANBCTR (SUTURE) ×2 IMPLANT
SUT VIC AB 2-0 CT1 27 (SUTURE) ×8
SUT VIC AB 2-0 CT1 TAPERPNT 27 (SUTURE) ×2 IMPLANT
SYR CONTROL 10ML LL (SYRINGE) IMPLANT
TOWEL OR 17X24 6PK STRL BLUE (TOWEL DISPOSABLE) ×2 IMPLANT
TOWEL OR 17X26 10 PK STRL BLUE (TOWEL DISPOSABLE) ×2 IMPLANT
TRAY FOLEY CATH 14FR (SET/KITS/TRAYS/PACK) ×2 IMPLANT
WATER STERILE IRR 1000ML POUR (IV SOLUTION) ×6 IMPLANT

## 2011-05-24 NOTE — Anesthesia Procedure Notes (Addendum)
Anesthesia Regional Block:  Femoral nerve block  Pre-Anesthetic Checklist: ,, timeout performed, Correct Patient, Correct Site, Correct Laterality, Correct Procedure,, site marked, risks and benefits discussed, Surgical consent,  Pre-op evaluation,  At surgeon's request and post-op pain management  Laterality: Right  Prep: chloraprep       Needles:  Injection technique: Single-shot  Needle Type: Echogenic Stimulator Needle     Needle Length: 5cm 5 cm Needle Gauge: 22 and 22 G    Additional Needles:  Procedures: ultrasound guided and nerve stimulator Femoral nerve block  Nerve Stimulator or Paresthesia:  Response: quadraceps contraction, 0.45 mA,   Additional Responses:   Narrative:  Start time: 05/24/2011 7:10 AM End time: 05/24/2011 7:18 AM Injection made incrementally with aspirations every 5 mL.  Performed by: Personally  Anesthesiologist: Halford Decamp, MD  Additional Notes: Functioning IV was confirmed and monitors were applied.  A 50mm 22ga Arrow echogenic stimulator needle was used. Sterile prep and drape,hand hygiene and sterile gloves were used. Ultrasound guidance: relevent anatomy identified, needle position confirmed, local anesthetic spread visualized around nerve(s)., vascular puncture avoided.  Image printed for medical record. Negative aspiration and negative test dose prior to incremental administration of local anesthetic. The patient tolerated the procedure well.    Femoral nerve block Procedure Name: LMA Insertion Date/Time: 05/24/2011 7:48 AM Performed by: Glendora Score Pre-anesthesia Checklist: Patient identified, Emergency Drugs available, Suction available and Patient being monitored Patient Re-evaluated:Patient Re-evaluated prior to inductionOxygen Delivery Method: Circle System Utilized Preoxygenation: Pre-oxygenation with 100% oxygen LMA: LMA inserted LMA Size: 5.0 Number of attempts: 1 Placement Confirmation: positive ETCO2 and breath  sounds checked- equal and bilateral Tube secured with: Tape Dental Injury: Teeth and Oropharynx as per pre-operative assessment

## 2011-05-24 NOTE — Interval H&P Note (Signed)
History and Physical Interval Note:  05/24/2011 7:32 AM  Grant Flores  has presented today for surgery, with the diagnosis of OSTEOARTHRITIS RIGHT KNEE  The various methods of treatment have been discussed with the patient and family. After consideration of risks, benefits and other options for treatment, the patient has consented to  Procedure(s): TOTAL KNEE ARTHROPLASTY as a surgical intervention .  The patients' history has been reviewed, patient examined, no change in status, stable for surgery.  I have reviewed the patients' chart and labs.  Questions were answered to the patient's satisfaction.     [name],W D

## 2011-05-24 NOTE — Consult Note (Signed)
Consult Note Date: 05/24/2011  Patient name: Grant Flores Medical record number: [id number] Date of birth: [birth year] Age: 70 y.o. Gender: male PCP: Ginette Otto, MD, MD  Attending physician: Thera Flake., MD Consulting Physician- Dr Conley Canal Reason for consult- perioperative Medical management of Htn/copd/CAD/Hyperlipidermia. Recommendations 1. COPD with some exacerbation- add bronchodilators, advair, check cxr. Continue O2 as you are doing. 2. Malignant Htn- uncontrolled. Resume home meds and adjust accordingly. 3. 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 Obtain EKG, continue home meds per cardiology. Be judicious with IVF. 4. Hyperlipidermia- continue statin 5. DVT/GI prophylaxis.  I was asked by Dr Madelon Lips to see this pleasant gentleman for perioperative medical management. I reviewed his medical records upto today. He had risk stratification done by Dr Merlene Laughter and Dr Gala Romney rpior to [phone], which was apparently uneventful today. He says he vomitted a couple of times this evening, otherwise he has also had some SOB/occasional cough and wheezing. He denies abdominal or chest pain. No fever.   Past Medical History  Diagnosis Date  . CAD (coronary artery disease) October 2008    s/p inferolateral STEMI.... PCI with BMS to LCX. (cath with EF 50%, mod inf HK, RCA 40%, small oD3 70%, OM1 40%, CFX occluded - tx with PCI);  echo 10/08: EF 45%, mild RVE;  Myoview 2009. Inferior infarct. no ischemia, EF 45%  . Hypertension   . High cholesterol   . COPD (chronic obstructive pulmonary disease)     with ongoing tobacco  . Obesity   . Myocardial infarction 2008  . Asthma   . Shortness of breath     with exertion     Meds: Prescriptions prior to admission  Medication Sig Dispense Refill  . atorvastatin (LIPITOR) 80 MG tablet Take 1 tablet (80 mg total) by mouth daily.  30 tablet  11  . carvedilol (COREG) 6.25 MG tablet Take 1 tablet (6.25 mg total) by mouth 2 (two)  times daily.  60 tablet  11  . valsartan-hydrochlorothiazide (DIOVAN-HCT) 80-12.5 MG per tablet Take 1 tablet by mouth daily.        [name] DISCONTD: aspirin EC 325 MG tablet Take 325 mg by mouth daily.        [name] DISCONTD: meloxicam (MOBIC) 7.5 MG tablet Take 7.5 mg by mouth daily as needed. For pain         Allergies: Review of patient's allergies indicates no known allergies. History   Social History  . Marital Status: Married    Spouse Name: N/A    Number of Children: N/A  . Years of Education: N/A   Occupational History  . Not on file.   Social History Main Topics  . Smoking status: Current Everyday Smoker -- 1.5 packs/day  . Smokeless tobacco: Never Used  . Alcohol Use: No  . Drug Use: No  . Sexually Active: Not on file   Other Topics Concern  . Not on file   Social History Narrative  . No narrative on file   Family History  Problem Relation Age of Onset  . Coronary artery disease Neg Hx   . Heart attack Father 39   Past Surgical History  Procedure Date  . Bare metal stent   . Arthrodesis     C4-C6  . Carpal tunnel release unknown    Bilateral   . Cardiovascular stress test 04/03/2011  . Cardiac catheterization 2008    cardiologist: Dr. Sharlet Salina   . Anterior cervical decomp/discectomy fusion before 2008  .  Appendectomy age 35    Review of Systems: Systems reviewed and as per HPI, otherwise negative.  Physical Exam: Blood pressure 147/77, pulse 86, temperature 97.8 F (36.6 C), temperature source Oral, resp. rate 20, height 5\' 7"  (1.702 m), weight 99.338 kg (219 lb), SpO2 97.00%. BP 147/77  Pulse 86  Temp(Src) 97.8 F (36.6 C) (Oral)  Resp 20  Ht 5\' 7"  (1.702 m)  Wt 99.338 kg (219 lb)  BMI 34.30 kg/m2  SpO2 97% Morbidly obese male, lying comfortably in bed.  HEENT-short neck. RS- some scant wheezing and occasional cough bilaterally. CVS-S1S2. No murmurs. Abdomen-morbidly obese, soft, nontender.+BS. CNS-grossly intact. Extremities- right knee drain in  place. No pedal edema. Peripheral pulses equal. Lab results: Wbc- 11.6, Hb 12.6 cr-0.71 glc 168.  Imaging results:  X-ray Knee Right Port  05/24/2011  *RADIOLOGY REPORT*  Clinical Data: Post arthroplasty.  PORTABLE RIGHT KNEE - 1-2 VIEW  Comparison: None.  Findings: Total right knee replacement appears in satisfactory position.  Surgical drain is in place.  Radiopaque material projects superior to the tibial tuberosity, exact etiology indeterminate.  Clinical correlation recommended.  IMPRESSION: Total right knee replacement appears in satisfactory position. Surgical drain is in place.  Radiopaque material projects superior to the tibial tuberosity, exact etiology indeterminate.  Clinical correlation recommended.  Original Report Authenticated By: Fuller Canada, M.D.    Discussion 70 year old male with OA s/p uneventful RTKR. Patient has CAD/Htn/COPD/Hyperlipidermia which seem generally stable. He has some mild acute exacerbation of COPD postop, not surprising, given continued smoking. He has had some vomiting postop, likely meds related, although he may have some gastritis, given lying supine for a while in this morbidly obese gentleman. He has some hyperglycermia, too early to tell if this is isolated(will monitor on chemistry). I have switched patient to morphine for pain control as he seems to be getting nauseated on dilaudid. I have also added protonix for gerd. i would have low threshold to add steroids/antibiotics if worsening COPD exacerbation. Smoking cessation counseling was given, and nicotine patch added. Please see rset of recommendations above. Thanks a lot for involving [place] in the care of this pleasant gentleman. We will follow with you.  Stacie Knutzen,[id number]. 05/24/2011, 11:18 PM

## 2011-05-24 NOTE — Preoperative (Signed)
Beta Blockers   Reason not to administer Beta Blockers:Not Applicable 

## 2011-05-24 NOTE — Anesthesia Postprocedure Evaluation (Signed)
Anesthesia Post Note  Patient: Grant Flores  Procedure(s) Performed:  TOTAL KNEE ARTHROPLASTY - RIGHT ARTHROSCOPY TOTAL KNEE  Anesthesia type: General  Patient location: PACU  Post pain: Pain level controlled  Post assessment: Patient's Cardiovascular Status Stable  Last Vitals:  Filed Vitals:   05/24/11 1240  BP:   Pulse: 65  Temp:   Resp: 16    Post vital signs: Reviewed and stable  Level of consciousness: sedated  Complications: No apparent anesthesia complications

## 2011-05-24 NOTE — Transfer of Care (Signed)
Immediate Anesthesia Transfer of Care Note  Patient: Grant Flores  Procedure(s) Performed:  TOTAL KNEE ARTHROPLASTY - RIGHT ARTHROSCOPY TOTAL KNEE  Patient Location: PACU  Anesthesia Type: General and Regional  Level of Consciousness: awake, alert  and confused  Airway & Oxygen Therapy: Patient Spontanous Breathing and Patient connected to face mask oxygen  Post-op Assessment: Report given to PACU RN  Post vital signs: Reviewed and stable  Complications: No apparent anesthesia complications

## 2011-05-24 NOTE — Anesthesia Preprocedure Evaluation (Addendum)
Anesthesia Evaluation  Patient identified by MRN, date of birth, ID band Patient awake    Reviewed: Allergy & Precautions, H&P , NPO status , Patient's Chart, lab work & pertinent test results  Airway Mallampati: I  Neck ROM: Limited    Dental  (+) Edentulous Upper and Edentulous Lower   Pulmonary shortness of breath and with exertion, asthma , COPD COPD inhaler,    + wheezing      Cardiovascular Exercise Tolerance: Good hypertension, Pt. on medications + CAD, + Past MI and +CHF Regular Normal- Systolic murmurs    Neuro/Psych    GI/Hepatic negative GI ROS, Neg liver ROS,   Endo/Other  Negative Endocrine ROS  Renal/GU negative Renal ROS     Musculoskeletal   Abdominal   Peds  Hematology   Anesthesia Other Findings   Reproductive/Obstetrics                          Anesthesia Physical Anesthesia Plan  ASA: III  Anesthesia Plan: General   Post-op Pain Management:    Induction:   Airway Management Planned: LMA  Additional Equipment:   Intra-op Plan:   Post-operative Plan: Extubation in OR  Informed Consent: I have reviewed the patients History and Physical, chart, labs and discussed the procedure including the risks, benefits and alternatives for the proposed anesthesia with the patient or authorized representative who has indicated his/her understanding and acceptance.     Plan Discussed with: CRNA, Surgeon and Anesthesiologist  Anesthesia Plan Comments:        Anesthesia Quick Evaluation

## 2011-05-24 NOTE — Brief Op Note (Signed)
05/24/2011  10:35 AM  PATIENT:  Grant Flores  70 y.o. male  PRE-OPERATIVE DIAGNOSIS:  OSTEOARTHRITIS RIGHT KNEE  POST-OPERATIVE DIAGNOSIS:  osteoarthritis right knee  PROCEDURE:  Procedure(s): TOTAL KNEE ARTHROPLASTY  SURGEON:  Surgeon(s): Thera Flake., MD  PHYSICIAN ASSISTANT:   ASSISTANTS: Calem Cocozza, PA-C   ANESTHESIA:   general  EBL:  Total I/O In: 1800 [I.V.:1800] Out: 650 [Urine:500; Blood:150]  BLOOD ADMINISTERED:none  DRAINS: Right knee hemovac drain to suction  LOCAL MEDICATIONS USED:  NONE  SPECIMEN:  No Specimen  DISPOSITION OF SPECIMEN:  N/A  COUNTS:  YES  TOURNIQUET:   Total Tourniquet Time Documented: Thigh (Right) - 89 minutes  DICTATION: .Other Dictation: Dictation Number   PLAN OF CARE: Admit to inpatient   PATIENT DISPOSITION:  PACU - hemodynamically stable.   Delay start of Pharmacological VTE agent (>24hrs) due to surgical blood loss or risk of bleeding:

## 2011-05-24 NOTE — H&P (View-Only) (Signed)
NAME: Dominyk Law MRN: [id number] DATE: May 07, 2011 DOB: [birth year]  COMPLAINT:     Preoperative H&P visit for right total knee arthroplasty.  HPI:     The patient is a 70 year old male with history of right knee end-stage osteoarthritis and also with coronary artery disease, history of myocardial infarction in 2008, stent placement, hypertension, hyperlipidemia, obesity, and chronic obstructive pulmonary disease with ongoing tobacco use.  He has failed conservative treatment with oral anti-inflammatories, aspiration, corticosteroid injection, and gait aids.  He has previous x-rays documenting medial compartment collapse, patellofemoral arthrosis, and varus arthrosis.  It is significantly affecting his quality of life and activities of daily living.  He has the most pain when weightbearing.  It will occasionally awaken him from sleep at night.  The risks and benefits of total knee arthroplasty were discussed at his previous visit and he wishes to proceed.  He has obtained medical clearance from his primary care physician, Dr. Merlene Laughter.  He has obtained cardiac clearance from his cardiologist, Dr. Arvilla Meres.  He underwent stress test on 04/09/11 showing 42% EF.  CURRENT MEDICATIONS:     Aspirin 325 mg daily, Mobic 7.5 mg daily, Coreg 6.25 mg b.i.d., and Benicar HCT 20-12.5 mg daily.  ALLERGIES:     NKDA.  PAST MEDICAL HISTORY:     Coronary artery disease, history of myocardial infarction in 2008 with stents placed, hypertension, hyperlipidemia, obesity, chronic obstructive pulmonary disease with ongoing tobacco use, and end-stage osteoarthritis right knee.  PAST SURGICAL HISTORY:     Unknown neck surgery in 2008.  Stent placement.  ROS:    Systemic review is positive for glasses, contacts, upper and lower dentures, myocardial infarction, and hypertension.  Please refer to patient information form for details.  FAMILY HISTORY:    Positive for diabetes in sister.  SOCIAL HISTORY:     The pack is a pack per day smoker for 30 years.  He does not drink alcohol.  He is married and lives with his wife in a one-story residence.    EXAM:     The patient is seated in the examination room in no acute distress.  He is alert, oriented, and appears appropriate age.   HT:  5'9"   WT:  200 pounds  BMI:  29.5  TEMP:  97.6 degrees  BP:  168/89    P:  77     R:  18  Skin:   No rashes, no lesions. HEENT:   PERRLA.  He does have upper and lower dentures.  His neck is supple with good range of motion.   Chest:   Decreased breath sounds bilaterally with faint expiratory wheezes throughout, left greater than right.   Heart:   Regular rate and rhythm.  No signs of murmurs.   Abdomen:   Soft, non tender.  Active bowel sounds in all four quadrants. Rectal:    Not indicated for surgery. Muscles:     No tenderness, no muscle atrophy. Neuro:   Cranial nerves grossly intact. Musculoskeletal:    Examination of the right lower extremity shows he is neurovascularly intact.  He has tenderness to palpation over the medial and lateral joint lines of the knee.  Range of motion from 0 degrees to 110 degrees.  Significant patellofemoral crepitus.  Mild varus inclination of the knee.  Ambulates with an antalgic gait.  X-RAYS:     No images were obtained today.  X-rays taken of the right knee on 03/04/11 show medial compartment collapse,  patellofemoral arthrosis, and varus arthrosis.  IMPRESSION:      1. Right knee osteoarthritis, end stage. 2. Coronary artery disease.  History of myocardial infarction in 2008 with stents. 3. Hypertension. 4. Hyperlipidemia. 5. Obesity. 6. Chronic obstructive pulmonary disease with ongoing tobacco use.  RECOMMENDATIONS:     The risks and benefits of right total knee arthroplasty were discussed again today.  The patient still wishes to proceed with the surgery which is currently scheduled for 05/24/11.  He has obtained medical and cardiac clearance from his primary care  physician and cardiologist.  The patient was given preoperative instructions.  He was also given an appointment for preoperative admission to [facility] on 05/15/11.  He will be placed on Lovenox postoperatively for DVT prophylaxis.  He will be given perioperative antibiotic Ancef.    He is planning to go home following the surgery pending clearance from inpatient therapist.  Home physical therapy has been arranged with [facility].  Due to his multiple medical comorbidities, we will likely consult a hospitalist for postoperative care while an inpatient.   Josh Ross Hefferan P.A.-C/[id number]  Auto-Authenticated by Estanislado Spire P.A.-C

## 2011-05-24 NOTE — Op Note (Signed)
Dictated [phone]

## 2011-05-25 ENCOUNTER — Other Ambulatory Visit: Payer: Self-pay

## 2011-05-25 LAB — BASIC METABOLIC PANEL
BUN: 9 mg/dL (ref 6–23)
Calcium: 8.3 mg/dL — ABNORMAL LOW (ref 8.4–10.5)
GFR calc Af Amer: >90 mL/min (ref >90)
GFR calc non Af Amer: >90 mL/min (ref >90)
Glucose, Bld: 108 mg/dL — ABNORMAL HIGH (ref 70–99)
Potassium: 3.5 mEq/L (ref 3.5–5.1)
Sodium: 137 mEq/L (ref 135–145)

## 2011-05-25 LAB — TSH: TSH: 0.793 u[IU]/mL (ref 0.350–4.500)

## 2011-05-25 LAB — URINE MICROSCOPIC-ADD ON

## 2011-05-25 LAB — URINALYSIS, ROUTINE W REFLEX MICROSCOPIC
Glucose, UA: NEGATIVE mg/dL
Leukocytes, UA: NEGATIVE
pH: 5.5 (ref 5.0–8.0)

## 2011-05-25 LAB — CBC
MCH: 29.4 pg (ref 26.0–34.0)
MCHC: 32.6 g/dL (ref 30.0–36.0)
RDW: 14.6 % (ref 11.5–15.5)

## 2011-05-25 MED ORDER — ALBUTEROL SULFATE (5 MG/ML) 0.5% IN NEBU: 2.5000 mg | INHALATION_SOLUTION | RESPIRATORY_TRACT | Status: DC | PRN

## 2011-05-25 NOTE — Progress Notes (Signed)
PATIENT DETAILS Name: Grant Flores Age: 70 y.o. Sex: male Date of Birth: 1941/04/07 Admit Date: 05/24/2011 ZOX:[id number],WJX Maisie Fus, MD, MD  Subjective: Doing well, no major complaints.  Objective: Vital signs in last 24 hours: Filed Vitals:   05/25/11 0210 05/25/11 0624 05/25/11 0912 05/25/11 1400  BP: 115/61 102/65  114/65  Pulse: 76 74  65  Temp: 98 F (36.7 C) 98.7 F (37.1 C)  98.5 F (36.9 C)  TempSrc:    Oral  Resp: 16 16  18   Height:      Weight:      SpO2: 100% 100% 100% 96%    Weight change: 0.454 kg (1 lb)  Body mass index is 34.30 kg/(m^2).  Intake/Output from previous day:  Intake/Output Summary (Last 24 hours) at 05/25/11 1742 Last data filed at 05/25/11 1500  Gross per 24 hour  Intake   2010 ml  Output   1325 ml  Net    685 ml    PHYSICAL EXAM: Gen Exam: Awake and alert with clear speech.   Neck: Supple, No JVD.   Chest: B/L Clear-except some scattered ronchi CVS: S1 S2 Regular, no murmurs.  Abdomen: soft, BS +, non tender, non distended.  Extremities: no edema, lower extremities warm to touch.Rt leg wrapped. Neurologic: Non Focal.   Skin: No Rash.   Wounds: N/A.    CONSULTS:  none  LAB RESULTS: CBC  Lab 05/25/11 0500 05/24/11 2127  WBC 12.0* 11.6*  HGB 12.1* 12.6*  HCT 37.1* 37.5*  PLT 213 219  MCV 90.0 89.3  MCH 29.4 30.0  MCHC 32.6 33.6  RDW 14.6 14.3  LYMPHSABS -- --  MONOABS -- --  EOSABS -- --  BASOSABS -- --  BANDABS -- --    Chemistries   Lab 05/25/11 0500 05/24/11 2127  NA 137 137  K 3.5 3.7  CL 103 102  CO2 27 26  GLUCOSE 108* 168*  BUN 9 11  CREATININE 0.67 0.71  CALCIUM 8.3* 8.3*  MG -- --    GFR Estimated Creatinine Clearance: 96.5 ml/min (by C-G formula based on Cr of 0.67).  Coagulation profile  Lab 05/24/11 2127  INR 1.07  PROTIME --    Cardiac Enzymes No results found for this basename: CK:3,CKMB:3,TROPONINI:3,MYOGLOBIN:3 in the last 168 hours  No results found for this basename:  POCBNP:3 in the last 168 hours No results found for this basename: DDIMER:2 in the last 72 hours No results found for this basename: HGBA1C:2 in the last 72 hours No results found for this basename: CHOL:2,HDL:2,LDLCALC:2,TRIG:2,CHOLHDL:2,LDLDIRECT:2 in the last 72 hours  Basename 05/25/11 0045  TSH 0.793  T4TOTAL --  T3FREE --  THYROIDAB --   No results found for this basename: VITAMINB12:2,FOLATE:2,FERRITIN:2,TIBC:2,IRON:2,RETICCTPCT:2 in the last 72 hours No results found for this basename: LIPASE:2,AMYLASE:2 in the last 72 hours  Urine Studies No results found for this basename: UACOL:2,UAPR:2,USPG:2,UPH:2,UTP:2,UGL:2,UKET:2,UBIL:2,UHGB:2,UNIT:2,UROB:2,ULEU:2,UEPI:2,UWBC:2,URBC:2,UBAC:2,CAST:2,CRYS:2,UCOM:2,BILUA:2 in the last 72 hours  MICROBIOLOGY: No results found for this or any previous visit (from the past 240 hour(s)).  RADIOLOGY STUDIES/RESULTS: Dg Chest 2 View  05/15/2011  *RADIOLOGY REPORT*  Clinical Data: Preop respiratory exam for knee surgery.  CHEST - 2 VIEW  Comparison: Chest x-ray 09/02/2008.  Findings: The cardiac silhouette, mediastinal and hilar contours are stable.  There are mild bronchitic type lung changes but no acute pulmonary findings.  No pleural effusions.  The bony thorax is intact.  IMPRESSION: Mild chronic bronchitic type lung changes but no acute cardiopulmonary findings.  Original Report Authenticated By: P.  Loralie Champagne, M.D.   Dg Chest Port 1 View  05/25/2011  *RADIOLOGY REPORT*  Clinical Data: Wheezing post knee surgery.  COPD.  PORTABLE CHEST - 1 VIEW  Comparison: 05/15/2011  Findings: Shallow inspiration. The heart size and pulmonary vascularity are normal. The lungs appear clear and expanded without focal air space disease or consolidation. No blunting of the costophrenic angles.  No pneumothorax.  IMPRESSION: No evidence of active pulmonary disease.  Original Report Authenticated By: Marlon Pel, M.D.   X-ray Knee Right  Port  05/24/2011  *RADIOLOGY REPORT*  Clinical Data: Post arthroplasty.  PORTABLE RIGHT KNEE - 1-2 VIEW  Comparison: None.  Findings: Total right knee replacement appears in satisfactory position.  Surgical drain is in place.  Radiopaque material projects superior to the tibial tuberosity, exact etiology indeterminate.  Clinical correlation recommended.  IMPRESSION: Total right knee replacement appears in satisfactory position. Surgical drain is in place.  Radiopaque material projects superior to the tibial tuberosity, exact etiology indeterminate.  Clinical correlation recommended.  Original Report Authenticated By: Fuller Canada, M.D.    MEDICATIONS: Scheduled Meds:   . acetaminophen  1,000 mg Intravenous Q6H  . aspirin EC  325 mg Oral Daily  . carvedilol  6.25 mg Oral BID  . ceFAZolin (ANCEF) IV  2 g Intravenous Q6H  . docusate sodium  100 mg Oral BID  . enoxaparin  30 mg Subcutaneous Q12H  . Fluticasone-Salmeterol  1 puff Inhalation BID  . hydrochlorothiazide  12.5 mg Oral Daily  . nicotine  14 mg Transdermal QHS  . olmesartan  10 mg Oral Daily  . pantoprazole  40 mg Oral Q1200  . simvastatin  20 mg Oral q1800  . DISCONTD: valsartan-hydrochlorothiazide  1 tablet Oral Daily   Continuous Infusions:   . DISCONTD: sodium chloride 75 mL/hr at 05/25/11 0240  . DISCONTD: sodium chloride     PRN Meds:.albuterol, ipratropium, menthol-cetylpyridinium, methocarbamol(ROBAXIN) IV, methocarbamol, metoCLOPramide (REGLAN) injection, metoCLOPramide, morphine injection, ondansetron (ZOFRAN) IV, ondansetron, oxyCODONE, phenol, senna-docusate, sodium phosphate, DISCONTD: albuterol, DISCONTD: HYDROmorphone  Antibiotics: Anti-infectives     Start     Dose/Rate Route Frequency Ordered Stop   05/24/11 1430   ceFAZolin (ANCEF) IVPB 2 g/50 mL premix        2 g 100 mL/hr over 30 Minutes Intravenous Every 6 hours 05/24/11 1421 05/25/11 0311   05/24/11 1423   ceFAZolin (ANCEF) 1-5 GM-% IVPB      Comments: LOPEZ, PHILLIP: cabinet override         05/24/11 1423 05/24/11 1430   05/23/11 1515   ceFAZolin (ANCEF) IVPB 2 g/50 mL premix  Status:  Discontinued        2 g 100 mL/hr over 30 Minutes Intravenous 60 min pre-op 05/23/11 1512 05/24/11 1447          Assessment/Plan: Patient Active Hospital Problem List: 1.S/P Right Knee Replacement: -Post op care per Primary team  2.COPD -better -continue with current treatment regimen -encourage incentive spirometry  3. HTN -better control today -continue with coreg,HCTZ and Benicar-will titrate as needed  4. CHF -mild systolic dysfunction -euvolemic on exam-toleratiing oral intake-would stop IVF  5.Dyslipidemia -continue with Statin.  Disposition: Home with Home health services  DVT Prophylaxis: Subcutaneous Lovenox  Code Status: Full code  Maretta Bees, MD. 05/25/2011, 5:42 PM

## 2011-05-25 NOTE — Progress Notes (Signed)
PATIENT ID:      Grant Flores  MRN:     [id number] DOB/AGE:    1940-07-02 / 70 y.o.    PROGRESS NOTE Subjective:  negative for Chest Pain  negative for Shortness of Breath  negative for Nausea/Vomiting   negative for Calf Pain  negative for Bowel Movement   Tolerating Diet: yes         Patient reports pain as 6 on 0-10 scale.    Objective: Vital signs in last 24 hours:  Patient Vitals for the past 24 hrs:  BP Temp Temp src Pulse Resp SpO2 Height Weight  05/25/11 0912 - - - - - 100 % - -  05/25/11 0624 102/65 mmHg 98.7 F (37.1 C) - 74  16  100 % - -  05/25/11 0210 115/61 mmHg 98 F (36.7 C) - 76  16  100 % - -  05/24/11 2238 147/77 mmHg 97.8 F (36.6 C) - 86  20  97 % - -  05/24/11 1500 138/86 mmHg 98.1 F (36.7 C) Oral 75  18  97 % 5\' 7"  (1.702 m) 99.338 kg (219 lb)  05/24/11 1435 - 98.2 F (36.8 C) - 70  14  100 % - -  05/24/11 1434 - - - 71  14  100 % - -  05/24/11 1433 - - - 74  17  100 % - -  05/24/11 1432 - - - 71  15  100 % - -  05/24/11 1431 - - - 73  13  100 % - -  05/24/11 1430 - - - 73  14  100 % - -  05/24/11 1429 - - - 72  16  100 % - -  05/24/11 1428 - - - 72  14  100 % - -  05/24/11 1427 - - - 72  13  100 % - -  05/24/11 1426 - - - 71  12  100 % - -  05/24/11 1425 - - - 73  18  100 % - -  05/24/11 1424 - - - 72  15  99 % - -  05/24/11 1423 - - - 64  14  100 % - -  05/24/11 1422 115/73 mmHg - - 69  14  100 % - -  05/24/11 1421 - - - 73  15  100 % - -  05/24/11 1420 - - - 66  13  100 % - -  05/24/11 1419 - - - 71  15  100 % - -  05/24/11 1418 - - - 73  19  99 % - -  05/24/11 1417 - - - 68  16  99 % - -  05/24/11 1416 - - - 71  16  100 % - -  05/24/11 1415 - - - 73  19  99 % - -  05/24/11 1414 - - - 69  17  100 % - -  05/24/11 1413 - - - 71  18  100 % - -  05/24/11 1412 - - - 74  20  100 % - -  05/24/11 1411 - - - 75  18  100 % - -  05/24/11 1410 - - - 76  17  99 % - -  05/24/11 1409 116/59 mmHg - - 79  15  99 % - -  05/24/11 1408 - - - 68  15  100 %  - -  05/24/11 1407 - - -  73  18  100 % - -  05/24/11 1406 - - - 72  12  100 % - -  05/24/11 1405 - - - 72  15  100 % - -  05/24/11 1404 - - - 71  14  100 % - -  05/24/11 1403 - - - 73  19  100 % - -  05/24/11 1402 - - - 73  15  100 % - -  05/24/11 1401 - - - 70  15  100 % - -  05/24/11 1400 - - - 68  17  100 % - -  05/24/11 1359 - - - 73  19  100 % - -  05/24/11 1358 - - - 69  16  99 % - -  05/24/11 1357 - - - 72  20  100 % - -  05/24/11 1356 - - - 70  18  98 % - -  05/24/11 1355 - - - 69  14  100 % - -  05/24/11 1354 131/85 mmHg - - 67  11  99 % - -  05/24/11 1353 - - - 68  14  100 % - -  05/24/11 1352 - - - 68  15  99 % - -  05/24/11 1351 - - - 69  17  99 % - -  05/24/11 1350 - - - 69  17  99 % - -  05/24/11 1349 - - - 68  16  100 % - -  05/24/11 1348 - - - 73  19  100 % - -  05/24/11 1347 - - - 67  14  99 % - -  05/24/11 1346 - - - 67  18  100 % - -  05/24/11 1345 - - - 67  16  100 % - -  05/24/11 1344 - - - 68  21  99 % - -  05/24/11 1343 - - - 65  17  99 % - -  05/24/11 1342 - - - 66  21  100 % - -  05/24/11 1341 - - - 68  13  98 % - -  05/24/11 1340 - - - 69  16  99 % - -  05/24/11 1339 - - - 71  22  98 % - -  05/24/11 1338 - - - 65  14  97 % - -  05/24/11 1337 144/71 mmHg - - 68  16  100 % - -  05/24/11 1336 - - - 71  14  99 % - -  05/24/11 1335 - - - 71  15  99 % - -  05/24/11 1334 - - - 68  18  99 % - -  05/24/11 1333 - - - 69  17  98 % - -  05/24/11 1332 - - - 67  14  99 % - -  05/24/11 1331 - - - 66  18  99 % - -  05/24/11 1330 - - - 68  17  100 % - -  05/24/11 1329 - - - 66  17  99 % - -  05/24/11 1328 - - - 67  19  99 % - -  05/24/11 1327 - - - 64  14  99 % - -  05/24/11 1326 - - - 71  13  99 % - -  05/24/11 1325 - - - 67  18  99 % - -  05/24/11  1324 - - - 67  12  98 % - -  05/24/11 1323 - - - 66  13  99 % - -  05/24/11 1322 - - - 71  16  97 % - -  05/24/11 1321 114/66 mmHg - - 67  14  98 % - -  05/24/11 1320 - - - 66  13  99 % - -  05/24/11 1319 - - - 70   14  99 % - -  05/24/11 1318 - - - 66  12  99 % - -  05/24/11 1317 - - - 68  12  99 % - -  05/24/11 1316 - - - 67  11  98 % - -  05/24/11 1315 - - - 66  15  98 % - -  05/24/11 1314 - - - 67  16  99 % - -  05/24/11 1313 - - - 70  15  99 % - -  05/24/11 1312 - - - 70  13  99 % - -  05/24/11 1311 - - - 69  16  97 % - -  05/24/11 1310 - - - 63  15  99 % - -  05/24/11 1309 - - - 71  18  98 % - -  05/24/11 1308 - - - 70  14  98 % - -  05/24/11 1307 - - - 69  15  97 % - -  05/24/11 1306 104/81 mmHg - - 66  15  99 % - -  05/24/11 1305 - - - 70  14  98 % - -  05/24/11 1304 - - - 69  16  99 % - -  05/24/11 1303 - - - 70  15  99 % - -  05/24/11 1302 - - - 70  16  98 % - -  05/24/11 1301 - - - 64  16  99 % - -  05/24/11 1300 - - - 70  15  98 % - -  05/24/11 1259 - - - 59  11  98 % - -  05/24/11 1258 - - - 70  14  99 % - -  05/24/11 1257 - - - 70  18  97 % - -  05/24/11 1256 - - - 65  15  99 % - -  05/24/11 1255 - - - 68  18  99 % - -  05/24/11 1254 - - - 68  16  99 % - -  05/24/11 1253 - - - 71  16  99 % - -  05/24/11 1252 108/90 mmHg - - 66  16  99 % - -  05/24/11 1251 - - - 68  15  99 % - -  05/24/11 1250 - - - 68  16  99 % - -  05/24/11 1249 - - - 70  14  99 % - -  05/24/11 1248 - - - 67  16  99 % - -  05/24/11 1247 - - - 69  14  99 % - -  05/24/11 1246 - - - 68  17  99 % - -  05/24/11 1245 - - - 67  16  99 % - -  05/24/11 1244 - - - 70  12  99 % - -  05/24/11 1243 - - - 66  12  100 % - -  05/24/11 1242 - - - 72  18  99 % - -  05/24/11 1241 - - - 69  16  97 % - -  05/24/11 1240 - - - 65  16  96 % - -  05/24/11 1239 - - - 65  17  98 % - -  05/24/11 1238 - - - 67  14  98 % - -  05/24/11 1237 - - - 70  15  98 % - -  05/24/11 1236 134/75 mmHg - - 62  15  99 % - -  05/24/11 1235 - - - 69  17  99 % - -  05/24/11 1234 - - - 69  15  100 % - -  05/24/11 1233 - - - 74  18  99 % - -  05/24/11 1232 - - - 73  16  97 % - -  05/24/11 1231 - - - 64  15  97 % - -  05/24/11 1230 - - - 65  15   97 % - -  05/24/11 1229 - - - 65  15  96 % - -  05/24/11 1228 - - - 68  15  96 % - -  05/24/11 1227 - - - 63  16  97 % - -  05/24/11 1226 - - - 63  15  98 % - -  05/24/11 1225 - - - 68  16  97 % - -  05/24/11 1224 - - - 61  14  98 % - -  05/24/11 1223 - - - 68  14  99 % - -  05/24/11 1222 125/73 mmHg - - 63  11  100 % - -  05/24/11 1221 - - - 74  17  98 % - -  05/24/11 1220 - - - 63  15  98 % - -  05/24/11 1219 - - - 69  12  99 % - -  05/24/11 1218 - - - 73  16  99 % - -  05/24/11 1217 - - - 68  21  96 % - -  05/24/11 1216 - - - 67  15  97 % - -  05/24/11 1215 - - - 61  15  98 % - -  05/24/11 1214 - - - 65  13  97 % - -  05/24/11 1213 - - - 66  11  99 % - -  05/24/11 1212 - - - 66  14  98 % - -  05/24/11 1211 - - - 72  14  99 % - -  05/24/11 1210 - - - 63  11  100 % - -  05/24/11 1209 - - - 73  16  99 % - -  05/24/11 1208 - - - 65  13  98 % - -  05/24/11 1207 150/78 mmHg - - 70  11  99 % - -  05/24/11 1206 - - - 72  14  98 % - -  05/24/11 1205 - - - 65  10  99 % - -  05/24/11 1204 - - - 68  12  100 % - -  05/24/11 1203 - - - 70  12  100 % - -  05/24/11 1202 - - - 71  13  100 % - -  05/24/11 1201 - - - 67  12  98 % - -  05/24/11 1200 - - - 72  16  99 % - -  05/24/11 1159 - - -  68  15  98 % - -  05/24/11 1158 - - - 69  13  99 % - -  05/24/11 1157 - - - 72  15  99 % - -  05/24/11 1156 - - - 65  13  99 % - -  05/24/11 1155 - - - 72  12  99 % - -  05/24/11 1154 - - - 71  22  98 % - -  05/24/11 1153 - - - 69  15  98 % - -  05/24/11 1152 - - - 72  17  99 % - -  05/24/11 1151 146/83 mmHg - - 73  15  100 % - -  05/24/11 1150 - - - 73  13  100 % - -  05/24/11 1149 - - - 68  14  99 % - -  05/24/11 1148 - - - 70  13  99 % - -  05/24/11 1147 - - - 69  14  99 % - -  05/24/11 1146 - - - 63  13  99 % - -  05/24/11 1145 - - - 70  16  99 % - -  05/24/11 1144 - - - 70  16  98 % - -  05/24/11 1143 - - - 65  15  97 % - -  05/24/11 1142 - - - 68  15  98 % - -  05/24/11 1141 - - - 65  14   97 % - -  05/24/11 1140 - - - 70  9  99 % - -  05/24/11 1139 - - - 70  15  98 % - -  05/24/11 1138 - - - 70  14  99 % - -  05/24/11 1137 - - - 70  16  99 % - -  05/24/11 1136 146/90 mmHg - - 71  10  99 % - -  05/24/11 1135 - - - 71  14  98 % - -  05/24/11 1134 - - - 71  15  100 % - -  05/24/11 1133 - - - 71  13  100 % - -  05/24/11 1132 - - - 72  15  99 % - -  05/24/11 1131 - - - 73  12  100 % - -  05/24/11 1130 - - - 74  12  100 % - -  05/24/11 1129 - - - 74  15  100 % - -  05/24/11 1128 - - - 71  12  99 % - -  05/24/11 1127 - - - 78  13  97 % - -  05/24/11 1126 - - - 71  9  97 % - -  05/24/11 1125 - - - 74  15  94 % - -  05/24/11 1124 - - - 70  14  95 % - -  05/24/11 1123 - - - 70  14  95 % - -  05/24/11 1122 - - - 69  12  96 % - -  05/24/11 1121 144/80 mmHg - - 73  14  95 % - -  05/24/11 1120 - - - 69  10  96 % - -  05/24/11 1119 - - - 71  11  97 % - -  05/24/11 1118 - - - 72  17  99 % - -  05/24/11 1117 - - - 70  14  98 % - -  05/24/11 1116 - - - 66  14  99 % - -  05/24/11 1115 - - - 72  17  98 % - -  05/24/11 1114 - - - 68  13  98 % - -  05/24/11 1113 - - - 68  10  98 % - -  05/24/11 1112 - - - 71  18  98 % - -  05/24/11 1111 - - - 71  20  98 % - -  05/24/11 1110 - - - 70  15  98 % - -  05/24/11 1109 - - - 70  15  98 % - -  05/24/11 1108 - - - 72  14  98 % - -  05/24/11 1107 - - - 73  20  97 % - -  05/24/11 1106 129/103 mmHg - - 72  14  98 % - -  05/24/11 1105 - - - 70  18  98 % - -  05/24/11 1104 - - - 72  14  97 % - -  05/24/11 1103 - - - 77  18  97 % - -  05/24/11 1102 - - - 61  17  97 % - -  05/24/11 1101 - - - 70  17  97 % - -  05/24/11 1100 - - - 69  13  98 % - -  05/24/11 1059 - - - 70  14  98 % - -  05/24/11 1058 - - - 74  18  99 % - -  05/24/11 1057 - - - 73  17  99 % - -  05/24/11 1056 - - - 77  18  100 % - -  05/24/11 1055 - - - 67  17  100 % - -  05/24/11 1054 - - - 71  17  100 % - -  05/24/11 1053 - - - 70  20  100 % - -  05/24/11 1052 112/89 mmHg -  - 75  19  100 % - -  05/24/11 1051 - - - 63  18  100 % - -  05/24/11 1050 - - - 61  14  100 % - -  05/24/11 1049 - - - 74  15  100 % - -  05/24/11 1048 - - - 74  19  100 % - -  05/24/11 1047 - - - - 15  - - -  05/24/11 1046 - - - 73  13  100 % - -  05/24/11 1045 - - - 72  18  100 % - -  05/24/11 1044 - - - 68  15  100 % - -  05/24/11 1043 - - - 71  18  100 % - -  05/24/11 1042 - - - 74  19  100 % - -  05/24/11 1041 - - - 74  19  100 % - -  05/24/11 1040 - - - 79  25  99 % - -  05/24/11 1039 - - - 78  27  98 % - -  05/24/11 1038 - - - 78  22  98 % - -  05/24/11 1037 134/107 mmHg - - - 30  - - -  05/24/11 1036 - - - 81  21  100 % - -  05/24/11 1030 - 98.6 F (37 C) - - - - - -      Intake/Output from previous day:   12/07 0701 -  12/08 0700 In: 3520 [P.O.:600; I.V.:2550] Out: 2290 [Urine:1625; Drains:515]   Intake/Output this shift:       Intake/Output      12/07 0701 - 12/08 0700 12/08 0701 - 12/09 0700   P.O. 600    I.V. (mL/kg) 2550 (25.7)    Other 70    IV Piggyback 300    Total Intake(mL/kg) 3520 (35.4)    Urine (mL/kg/hr) 1625 (0.7)    Drains 515    Blood 150    Total Output 2290    Net +1230         Emesis Occurrence 2 x       LABORATORY DATA:  Basename 05/25/11 0500 05/24/11 2127  WBC 12.0* 11.6*  HGB 12.1* 12.6*  HCT 37.1* 37.5*  PLT 213 219    Basename 05/25/11 0500 05/24/11 2127  NA 137 137  K 3.5 3.7  CL 103 102  CO2 27 26  BUN 9 11  CREATININE 0.67 0.71  GLUCOSE 108* 168*  CALCIUM 8.3* 8.3*   Lab Results  Component Value Date   INR 1.07 05/24/2011   INR 1.06 05/15/2011   INR 1.1 08/23/2008    Examination:  General appearance: alert, cooperative and no distress Extremities: extremities normal, atraumatic, no cyanosis or edema and Homans sign is negative, no sign of DVT  Wound Exam: clean, dry, intact   Drainage:  Scant/small amount Serosanguinous exudate  Motor Exam EHL and FHL Intact  Sensory Exam Deep Peroneal  normal  Assessment:    1 Day Post-Op  Procedure(s) (LRB): TOTAL KNEE ARTHROPLASTY (Right)  ADDITIONAL DIAGNOSIS:  Active Problems:  * No active hospital problems. *   Acute Blood Loss Anemia   Plan: Physical Therapy as ordered Weight Bearing as Tolerated (WBAT)  DVT Prophylaxis:  Lovenox  DISCHARGE PLAN: Home  DISCHARGE NEEDS: HHPT, CPM, Walker and 3-in-1 comode seat         Caryl Manas 05/25/2011, 9:15 AM

## 2011-05-25 NOTE — Op Note (Signed)
NAME:  WALDRON, GERRY NO.:  [id number]  MEDICAL RECORD NO.:  [id number]  LOCATION:  5009                         FACILITY:  [facility]  PHYSICIAN:  Dyke Brackett, M.D.    DATE OF BIRTH:  1941-04-06  DATE OF PROCEDURE:  05/24/2011 DATE OF DISCHARGE:                              OPERATIVE REPORT   INDICATIONS:  This is a 70 year old, intractable right knee pain, osteoarthritis, thought to be amenable to outpatient surgery.  PREOPERATIVE DIAGNOSIS:  Osteoarthritis, right knee.  POSTOPERATIVE DIAGNOSIS:  Osteoarthritis, right knee.  OPERATIONS:  Sigma size 4 cemented knee with 10 mm bearing, 38 mm patella, size 4 tibia.  SURGEON:  Dyke Brackett, MD  ASSISTANT:  Margart Sickles, PA-C  TOURNIQUET TIME:  1 hour and 25 minutes.  PROCEDURE:  Sterile prep and drape, exsanguination of leg, inflation of tourniquet to 375.  Straight skin incision medial parapatellar approach to the knee made.  Significant varus deformity noted.  We had to do a lot of medial stripping to balance the extension gap at 10 mm through the osteophytes off the tibia.  Cut additionally 11 mm off the distal femur due to a flexion contracture.  After the distal femoral cut, we cut the proximal tibia 10 mm below the least diseased lateral compartment with the external guide.  We then sized the femur to be a size 4 femur.  The alignment block was placed into the center with rod to reset the anterior and posterior cutting dimensions followed by the rotational guide, then be placed with a 10 mm jig with X2 pins and then use the size 4 cutting block to cut the anterior and posterior chamfer cuts.  We then matched the extension gap with the flexion gap at 10 mm.  Attention was next directed at the tibia.  The tibial keel was cut with a standard tibial, 0 degree slope.  We then placed a trial tibia and then the box cut for the femur, for the Sigma knee, then placed a trial femur and tibia and did  additional posterior stripping.  We did obtain full extension on the table with a good stability for the bearing flexion, good alignment and varus.  Patella was cut leaving 18 mm of native patella for 3 PEG patella.  We trialed all components.  Trial reduction of full extension, excellent stability, good balance was noted.  Final components were inserted with cement in a doughy state to tibia, followed by femur and patella.  We elected not to cement the final bearing.  The trial bearing was removed.  We checked this for excess cement and removed some excessive at the posterior aspect of the knee. Tourniquet was released with no bearing and no excessive bleeding was noted.  Small bleeders were coagulated.  Closure was affected with #1 Ethibond, 2-0 Vicryl, and skin clips.  Light compressive sterile dressing and knee immobilizer were applied.  Taken to recovery room in stable condition.     Dyke Brackett, M.D.     WDC/MEDQ  D:  05/24/2011  T:  05/24/2011  Job:  [id number]

## 2011-05-25 NOTE — Progress Notes (Signed)
Physical Therapy Evaluation Patient Details Name: Grant Flores MRN: [id number] DOB: January 16, 1941 Today's Date: 05/25/2011  Problem List:  Patient Active Problem List  Diagnoses  . MIXED HYPERLIPIDEMIA  . TOBACCO ABUSE  . HYPERTENSION, BENIGN  . CAD, NATIVE VESSEL  . CHF  . Right knee DJD    Past Medical History:  Past Medical History  Diagnosis Date  . CAD (coronary artery disease) October 2008    s/p inferolateral STEMI.... PCI with BMS to LCX. (cath with EF 50%, mod inf HK, RCA 40%, small oD3 70%, OM1 40%, CFX occluded - tx with PCI);  echo 10/08: EF 45%, mild RVE;  Myoview 2009. Inferior infarct. no ischemia, EF 45%  . Hypertension   . High cholesterol   . COPD (chronic obstructive pulmonary disease)     with ongoing tobacco  . Obesity   . Myocardial infarction 2008  . Asthma   . Shortness of breath     with exertion    Past Surgical History:  Past Surgical History  Procedure Date  . Bare metal stent   . Arthrodesis     C4-C6  . Carpal tunnel release unknown    Bilateral   . Cardiovascular stress test 04/03/2011  . Cardiac catheterization 2008    cardiologist: Dr. Sharlet Salina   . Anterior cervical decomp/discectomy fusion before 2008  . Appendectomy age 16    PT Assessment/Plan/Recommendation PT Assessment Clinical Impression Statement: Pt presents with a medical diagnosis of Right TKA along with the following impairments/deficits and therapy diagnosis listed below. Pt will benefit from skilled PT in the acute care setting in order to maximize functional mobility for a safe d/c home. PT Recommendation/Assessment: Patient will need skilled PT in the acute care venue PT Problem List: Decreased strength;Decreased range of motion;Decreased activity tolerance;Decreased mobility;Decreased knowledge of use of DME;Decreased knowledge of precautions;Pain PT Therapy Diagnosis : Difficulty walking;Acute pain PT Plan PT Frequency: 7X/week PT Treatment/Interventions: DME  instruction;Gait training;Stair training;Functional mobility training;Therapeutic activities;Therapeutic exercise;Patient/family education PT Recommendation Follow Up Recommendations: Home health PT;24 hour supervision/assistance Equipment Recommended: None recommended by PT PT Goals  Acute Rehab PT Goals PT Goal Formulation: With patient Time For Goal Achievement: 7 days Pt will go Supine/Side to Sit: with modified independence PT Goal: Supine/Side to Sit - Progress: Progressing toward goal Pt will go Sit to Supine/Side: with modified independence PT Goal: Sit to Supine/Side - Progress: Progressing toward goal Pt will go Sit to Stand: with modified independence PT Goal: Sit to Stand - Progress: Progressing toward goal Pt will go Stand to Sit: with modified independence PT Goal: Stand to Sit - Progress: Progressing toward goal Pt will Transfer Bed to Chair/Chair to Bed: with supervision PT Transfer Goal: Bed to Chair/Chair to Bed - Progress: Progressing toward goal Pt will Ambulate: >150 feet;with supervision;with rolling walker PT Goal: Ambulate - Progress: Other (comment) (Unassessed today) Pt will Go Up / Down Stairs: 1-2 stairs;with min assist;with rolling walker PT Goal: Up/Down Stairs - Progress: Other (comment) (Unassessed today) Pt will Perform Home Exercise Program: Independently PT Goal: Perform Home Exercise Program - Progress: Progressing toward goal  PT Evaluation Precautions/Restrictions  Restrictions Weight Bearing Restrictions: Yes RLE Weight Bearing: Weight bearing as tolerated Prior Functioning  Home Living Lives With: Spouse Receives Help From: Family Type of Home: House Home Layout: One level Home Access: Stairs to enter Entrance Stairs-Rails: None Entrance Stairs-Number of Steps: 1 (little curb) Bathroom Shower/Tub: Engineer, manufacturing systems: Standard Bathroom Accessibility: Yes How Accessible: Accessible via walker Home  Adaptive Equipment:  Bedside commode/3-in-1;Walker - rolling;Straight cane;Shower chair with back Prior Function Level of Independence: Independent with basic ADLs;Independent with homemaking with ambulation;Independent with transfers;Independent with gait (used cane for long distances) Able to Take Stairs?: Yes Driving: Yes Vocation: Retired Financial risk analyst Arousal/Alertness: Awake/alert Overall Cognitive Status: Appears within functional limits for tasks assessed Orientation Level: Oriented X4 Sensation/Coordination Sensation Light Touch: Impaired Detail Light Touch Impaired Details: Impaired RLE (decreased sensation mid calf) Extremity Assessment RLE Assessment RLE Assessment: Exceptions to [facility] RLE AROM (degrees) Overall AROM Right Lower Extremity: Deficits;Due to pain (Hip and Ankle WFL; Knee -5-75 degrees) RLE Strength RLE Overall Strength: Deficits;Due to pain (Hip and Ankle WFL; Pt able to complete SLR with min assist) LLE Assessment LLE Assessment: Within Functional Limits Mobility (including Balance) Bed Mobility Bed Mobility: No (pt sitting at EOB with tech upon arrival) Transfers Transfers: Yes Sit to Stand: 3: Mod assist;With upper extremity assist;From bed Sit to Stand Details (indicate cue type and reason): VC for hand placement and safety to RW. assist with balance upon standing Stand to Sit: 2: Max assist;With upper extremity assist;To chair/3-in-1 Stand to Sit Details: VC for hand placement and sequencing. Pt feeling faint upon standing, requiring max assist for control into chair Stand Pivot Transfers: 3: Mod assist Stand Pivot Transfer Details (indicate cue type and reason): VC for sequencing with RW from bed to chair. Assist for stability and guiding Ambulation/Gait Ambulation/Gait: No (Pt not feeling well and decreased BP with transfer)    Exercise  Total Joint Exercises Ankle Circles/Pumps: AROM;Strengthening;Both;10 reps;Supine End of Session PT - End of  Session Equipment Utilized During Treatment: Gait belt;Right knee immobilizer Activity Tolerance: Patient tolerated treatment well Patient left: in chair;with call bell in reach Nurse Communication: Mobility status for transfers;Mobility status for ambulation;Other (comment) (Decreased BP) General Behavior During Session: [facility] for tasks performed Cognition: [facility] for tasks performed  Milana Kidney 05/25/2011, 10:08 AM  05/25/2011 Milana Kidney DPT PAGER: [phone] OFFICE: [phone]

## 2011-05-26 LAB — CBC
MCHC: 33.6 g/dL (ref 30.0–36.0)
RDW: 14.5 % (ref 11.5–15.5)

## 2011-05-26 NOTE — Progress Notes (Signed)
Physical Therapy Treatment Patient Details Name: Grant Flores MRN: [id number] DOB: [birth year] Today's Date: 05/26/2011  PT Assessment/Plan  PT - Assessment/Plan Comments on Treatment Session: Excellent progress noted today.  Significant decrease in assist needed for mobility and increase ambulation distance. PT Plan: Discharge plan remains appropriate PT Frequency: 7X/week Follow Up Recommendations: Home health PT Equipment Recommended: None recommended by PT PT Goals  Acute Rehab PT Goals PT Goal: Supine/Side to Sit - Progress: Progressing toward goal PT Goal: Sit to Supine/Side - Progress: Progressing toward goal PT Goal: Sit to Stand - Progress: Progressing toward goal PT Goal: Stand to Sit - Progress: Progressing toward goal PT Transfer Goal: Bed to Chair/Chair to Bed - Progress: Progressing toward goal PT Goal: Ambulate - Progress: Progressing toward goal PT Goal: Perform Home Exercise Program - Progress: Progressing toward goal  PT Treatment Precautions/Restrictions  Precautions Precautions: Knee Required Braces or Orthoses: Yes Knee Immobilizer:  (as tolerated) Restrictions Weight Bearing Restrictions: Yes RLE Weight Bearing: Weight bearing as tolerated Mobility (including Balance) Bed Mobility Bed Mobility: Yes Supine to Sit: 4: Min assist;HOB elevated (Comment degrees);With rails (HOB 30 degrees) Supine to Sit Details (indicate cue type and reason): verbal cues for sequencing Transfers Sit to Stand: 4: Min assist;From bed;With upper extremity assist Sit to Stand Details (indicate cue type and reason): verbal cues for hand placement Stand to Sit: 4: Min assist;With upper extremity assist;To chair/3-in-1;With armrests Stand to Sit Details: verbal cues for sequencing/safety Ambulation/Gait Ambulation/Gait: Yes Ambulation/Gait Assistance: 4: Min assist Ambulation/Gait Assistance Details (indicate cue type and reason): verbal cues for RW management, posture and  sequencing Ambulation Distance (Feet): 100 Feet Assistive device: Rolling walker Gait Pattern: Step-to pattern;Antalgic;Decreased stance time - right    Exercise  Total Joint Exercises Ankle Circles/Pumps: AROM;Both;10 reps;Supine Quad Sets: AROM;Right;10 reps;Supine Heel Slides: AAROM;Right;10 reps;Supine Hip ABduction/ADduction: AAROM;Right;10 reps;Supine Knee Flexion: AAROM;Right (65 degrees) End of Session PT - End of Session Equipment Utilized During Treatment: Gait belt;Right knee immobilizer Activity Tolerance: Patient tolerated treatment well Patient left: in chair;with call bell in reach Nurse Communication: Mobility status for transfers;Mobility status for ambulation General Behavior During Session: [facility] for tasks performed Cognition: [facility] for tasks performed  Ilda Foil 05/26/2011, 11:41 AM  Aida Raider, PT  Office # [phone] Pager [phone]

## 2011-05-26 NOTE — Progress Notes (Signed)
Subjective: 2 Days Post-Op Procedure(s) (LRB): TOTAL KNEE ARTHROPLASTY (Right) Patient reports pain as 2 on 0-10 scale.    Objective: Vital signs in last 24 hours: Temp:  [98.3 F (36.8 C)-98.5 F (36.9 C)] 98.3 F (36.8 C) (12/09 0550) Pulse Rate:  [65-103] 93  (12/09 0550) Resp:  [16-18] 16  (12/09 0550) BP: (114-128)/(59-68) 117/59 mmHg (12/09 0550) SpO2:  [94 %-100 %] 97 % (12/09 0743)  Intake/Output from previous day: 12/08 0701 - 12/09 0700 In: 1320 [P.O.:1320] Out: 1850 [Urine:1850] Intake/Output this shift:     Basename 05/26/11 0630 05/25/11 0500 05/24/11 2127  HGB 10.1* 12.1* 12.6*    Basename 05/26/11 0630 05/25/11 0500  WBC 13.3* 12.0*  RBC 3.35* 4.12*  HCT 30.1* 37.1*  PLT 192 213    Basename 05/25/11 0500 05/24/11 2127  NA 137 137  K 3.5 3.7  CL 103 102  CO2 27 26  BUN 9 11  CREATININE 0.67 0.71  GLUCOSE 108* 168*  CALCIUM 8.3* 8.3*    Basename 05/24/11 2127  LABPT --  INR 1.07    Neurologically intact Neurovascular intact Intact pulses distally Dorsiflexion/Plantar flexion intact Incision: dressing C/D/I  Assessment/Plan: 2 Days Post-Op Procedure(s) (LRB): TOTAL KNEE ARTHROPLASTY (Right) Plan for discharge tomorrow  Windie Marasco,STEPHEN D 05/26/2011, 8:28 AM

## 2011-05-26 NOTE — Progress Notes (Signed)
Occupational Therapy Evaluation Patient Details Name: Grant Flores MRN: [id number] DOB: 1940/11/11 Today's Date: 05/26/2011  Problem List:  Patient Active Problem List  Diagnoses  . MIXED HYPERLIPIDEMIA  . TOBACCO ABUSE  . HYPERTENSION, BENIGN  . CAD, NATIVE VESSEL  . CHF  . Right knee DJD    Past Medical History:  Past Medical History  Diagnosis Date  . CAD (coronary artery disease) October 2008    s/p inferolateral STEMI.... PCI with BMS to LCX. (cath with EF 50%, mod inf HK, RCA 40%, small oD3 70%, OM1 40%, CFX occluded - tx with PCI);  echo 10/08: EF 45%, mild RVE;  Myoview 2009. Inferior infarct. no ischemia, EF 45%  . Hypertension   . High cholesterol   . COPD (chronic obstructive pulmonary disease)     with ongoing tobacco  . Obesity   . Myocardial infarction 2008  . Asthma   . Shortness of breath     with exertion    Past Surgical History:  Past Surgical History  Procedure Date  . Bare metal stent   . Arthrodesis     C4-C6  . Carpal tunnel release unknown    Bilateral   . Cardiovascular stress test 04/03/2011  . Cardiac catheterization 2008    cardiologist: Dr. Sharlet Salina   . Anterior cervical decomp/discectomy fusion before 2008  . Appendectomy age 65    OT Assessment/Plan/Recommendation OT Assessment Clinical Impression Statement: Pt is a 70 year old man recovering from R TKA.  He requires min assist for mobility, min to mod assist for ADL.  Pt educated on adaptive equipment for lower body ADL, but prefers to rely on his wife. Will follow acutely for ADL.  Do not anticipate OT needs upon d/c.  Pt has all necessary DME at home. OT Recommendation/Assessment: Patient will need skilled OT in the acute care venue OT Problem List: Decreased range of motion;Decreased activity tolerance;Impaired balance (sitting and/or standing);Decreased knowledge of use of DME or AE;Pain OT Therapy Diagnosis : Generalized weakness;Acute pain OT Plan OT Frequency: Min 2X/week OT  Treatment/Interventions: Self-care/ADL training;Patient/family education OT Recommendation Equipment Recommended: None recommended by OT Individuals Consulted Consulted and Agree with Results and Recommendations: Patient;Family member/caregiver Family Member Consulted: wife OT Goals Acute Rehab OT Goals OT Goal Formulation: With patient Time For Goal Achievement: 7 days ADL Goals Pt Will Perform Grooming: with supervision;Standing at sink ADL Goal: Grooming - Progress: Other (at least 2 activities) Pt Will Transfer to Toilet: with supervision;3-in-1 (over toilet) ADL Goal: Toilet Transfer - Progress: Other (comment) Pt Will Perform Toileting - Clothing Manipulation: with supervision;Standing ADL Goal: Toileting - Clothing Manipulation - Progress: Other (comment) Pt Will Perform Toileting - Hygiene: Independently;Sitting on 3-in-1 or toilet ADL Goal: Toileting - Hygiene - Progress: Other (comment) Pt Will Perform Tub/Shower Transfer: Tub transfer;with min assist;Shower seat with back;Ambulation ADL Goal: Tub/Shower Transfer - Progress: Other (comment)  OT Evaluation Precautions/Restrictions  Precautions Precautions: Knee;Fall Required Braces or Orthoses: Yes Knee Immobilizer:  (as tolerated) Restrictions Weight Bearing Restrictions: Yes RLE Weight Bearing: Weight bearing as tolerated Prior Functioning Home Living Lives With: Spouse Receives Help From: Family Type of Home: House Home Layout: One level Home Access: Stairs to enter Entrance Stairs-Rails: None Entrance Stairs-Number of Steps: 1 Bathroom Shower/Tub: Engineer, manufacturing systems: Standard Bathroom Accessibility: Yes How Accessible: Accessible via walker Home Adaptive Equipment: Bedside commode/3-in-1;Walker - rolling;Straight cane;Shower chair with back Prior Function Level of Independence: Independent with basic ADLs;Independent with homemaking with ambulation;Independent with transfers;Independent with  gait  Able to Take Stairs?: Yes Driving: Yes Vocation: Retired ADL ADL Eating/Feeding: Performed;Independent Where Assessed - Eating/Feeding: Chair Grooming: Performed;Wash/dry hands;Set up Where Assessed - Grooming: Sitting, chair Upper Body Bathing: Simulated;Set up Where Assessed - Upper Body Bathing: Sitting, chair Lower Body Bathing: Simulated;Moderate assistance Where Assessed - Lower Body Bathing: Sitting, chair;Sit to stand from chair Upper Body Dressing: Performed;Set up Where Assessed - Upper Body Dressing: Sitting, chair Lower Body Dressing: Moderate assistance;Performed Where Assessed - Lower Body Dressing: Sitting, chair;Sit to stand from chair Toilet Transfer: Performed;Minimal assistance Toilet Transfer Details (indicate cue type and reason): 3 in 1 over toilet, cues for technique Toilet Transfer Method: Ambulating Toilet Transfer Equipment: Raised toilet seat with arms (or 3-in-1 over toilet) Toileting - Clothing Manipulation: Performed;Maximal assistance Where Assessed - Toileting Clothing Manipulation: Standing Equipment Used: Rolling walker;Other (comment) (showed pt/wife AE for LB ADL) ADL Comments: Pt with bladder incontinence.  Impulsivity noted when ambulating to bathroom. Vision/Perception  Vision - History Baseline Vision: No visual deficits Cognition Cognition Arousal/Alertness: Awake/alert Overall Cognitive Status: Appears within functional limits for tasks assessed Orientation Level: Oriented X4 Extremity Assessment RUE Assessment RUE Assessment: Within Functional Limits LUE Assessment LUE Assessment: Within Functional Limits Mobility  Bed Mobility Bed Mobility: Yes Supine to Sit: 4: Min assist;HOB elevated (Comment degrees);With rails (HOB 30 degrees) Supine to Sit Details (indicate cue type and reason): verbal cues for sequencing Transfers Transfers: Yes Sit to Stand: 4: Min assist;From chair/3-in-1;Without upper extremity assist Sit to  Stand Details (indicate cue type and reason): verbal cues for technique Stand to Sit: 4: Min assist;To chair/3-in-1;With armrests Stand to Sit Details: verbal cues for technique  OT - End of Session Equipment Utilized During Treatment: Gait belt;Right knee immobilizer Activity Tolerance: Other (comment) (limited by nausea with activity) Patient left: in chair;with call bell in reach;with family/visitor present General Behavior During Session: [facility] for tasks performed Cognition: [facility] for tasks performed   Evern Bio 05/26/2011, 1:25 PM [phone]

## 2011-05-26 NOTE — Progress Notes (Signed)
PATIENT DETAILS Name: Grant Flores Age: 70 y.o. Sex: male Date of Birth: 1940/06/18 Admit Date: 05/24/2011 ZOX:[id number],WJX THOMAS, MD, MD  Subjective: No major events overnight-"playing tennis Wednesday"  Objective: Vital signs in last 24 hours: Filed Vitals:   05/25/11 2044 05/25/11 2200 05/26/11 0550 05/26/11 0743  BP:  128/68 117/59   Pulse:  103 93   Temp:  98.5 F (36.9 C) 98.3 F (36.8 C)   TempSrc:      Resp:  16 16   Height:      Weight:      SpO2: 94% 98% 96% 97%    Weight change:   Body mass index is 34.30 kg/(m^2).  Intake/Output from previous day:  Intake/Output Summary (Last 24 hours) at 05/26/11 1354 Last data filed at 05/26/11 1100  Gross per 24 hour  Intake   1560 ml  Output   1550 ml  Net     10 ml    PHYSICAL EXAM: Gen Exam: Awake and alert with clear speech.   Neck: Supple, No JVD.   Chest: B/L Clear-no rhonchi heard today CVS: S1 S2 Regular, no murmurs.  Abdomen: soft, BS +, non tender, slightly distended.  Extremities: no edema, lower extremities warm to touch.Rt leg wrapped. Neurologic: Non Focal.   Skin: No Rash.   Wounds: N/A.    CONSULTS:  none  LAB RESULTS: CBC  Lab 05/26/11 0630 05/25/11 0500 05/24/11 2127  WBC 13.3* 12.0* 11.6*  HGB 10.1* 12.1* 12.6*  HCT 30.1* 37.1* 37.5*  PLT 192 213 219  MCV 89.9 90.0 89.3  MCH 30.1 29.4 30.0  MCHC 33.6 32.6 33.6  RDW 14.5 14.6 14.3  LYMPHSABS -- -- --  MONOABS -- -- --  EOSABS -- -- --  BASOSABS -- -- --  BANDABS -- -- --    Chemistries   Lab 05/25/11 0500 05/24/11 2127  NA 137 137  K 3.5 3.7  CL 103 102  CO2 27 26  GLUCOSE 108* 168*  BUN 9 11  CREATININE 0.67 0.71  CALCIUM 8.3* 8.3*  MG -- --    GFR Estimated Creatinine Clearance: 96.5 ml/min (by C-G formula based on Cr of 0.67).  Coagulation profile  Lab 05/24/11 2127  INR 1.07  PROTIME --    Cardiac Enzymes No results found for this basename: CK:3,CKMB:3,TROPONINI:3,MYOGLOBIN:3 in the last 168  hours  No results found for this basename: POCBNP:3 in the last 168 hours No results found for this basename: DDIMER:2 in the last 72 hours No results found for this basename: HGBA1C:2 in the last 72 hours No results found for this basename: CHOL:2,HDL:2,LDLCALC:2,TRIG:2,CHOLHDL:2,LDLDIRECT:2 in the last 72 hours  Basename 05/25/11 0045  TSH 0.793  T4TOTAL --  T3FREE --  THYROIDAB --   No results found for this basename: VITAMINB12:2,FOLATE:2,FERRITIN:2,TIBC:2,IRON:2,RETICCTPCT:2 in the last 72 hours No results found for this basename: LIPASE:2,AMYLASE:2 in the last 72 hours  Urine Studies No results found for this basename: UACOL:2,UAPR:2,USPG:2,UPH:2,UTP:2,UGL:2,UKET:2,UBIL:2,UHGB:2,UNIT:2,UROB:2,ULEU:2,UEPI:2,UWBC:2,URBC:2,UBAC:2,CAST:2,CRYS:2,UCOM:2,BILUA:2 in the last 72 hours  MICROBIOLOGY: No results found for this or any previous visit (from the past 240 hour(s)).  RADIOLOGY STUDIES/RESULTS: Dg Chest 2 View  05/15/2011  *RADIOLOGY REPORT*  Clinical Data: Preop respiratory exam for knee surgery.  CHEST - 2 VIEW  Comparison: Chest x-ray 09/02/2008.  Findings: The cardiac silhouette, mediastinal and hilar contours are stable.  There are mild bronchitic type lung changes but no acute pulmonary findings.  No pleural effusions.  The bony thorax is intact.  IMPRESSION: Mild chronic bronchitic type lung changes  but no acute cardiopulmonary findings.  Original Report Authenticated By: P. Loralie Champagne, M.D.   Dg Chest Port 1 View  05/25/2011  *RADIOLOGY REPORT*  Clinical Data: Wheezing post knee surgery.  COPD.  PORTABLE CHEST - 1 VIEW  Comparison: 05/15/2011  Findings: Shallow inspiration. The heart size and pulmonary vascularity are normal. The lungs appear clear and expanded without focal air space disease or consolidation. No blunting of the costophrenic angles.  No pneumothorax.  IMPRESSION: No evidence of active pulmonary disease.  Original Report Authenticated By: Marlon Pel, M.D.   X-ray Knee Right Port  05/24/2011  *RADIOLOGY REPORT*  Clinical Data: Post arthroplasty.  PORTABLE RIGHT KNEE - 1-2 VIEW  Comparison: None.  Findings: Total right knee replacement appears in satisfactory position.  Surgical drain is in place.  Radiopaque material projects superior to the tibial tuberosity, exact etiology indeterminate.  Clinical correlation recommended.  IMPRESSION: Total right knee replacement appears in satisfactory position. Surgical drain is in place.  Radiopaque material projects superior to the tibial tuberosity, exact etiology indeterminate.  Clinical correlation recommended.  Original Report Authenticated By: Fuller Canada, M.D.    MEDICATIONS: Scheduled Meds:    . aspirin EC  325 mg Oral Daily  . carvedilol  6.25 mg Oral BID  . docusate sodium  100 mg Oral BID  . enoxaparin  30 mg Subcutaneous Q12H  . Fluticasone-Salmeterol  1 puff Inhalation BID  . hydrochlorothiazide  12.5 mg Oral Daily  . nicotine  14 mg Transdermal QHS  . olmesartan  10 mg Oral Daily  . pantoprazole  40 mg Oral Q1200  . simvastatin  20 mg Oral q1800   Continuous Infusions:    . DISCONTD: sodium chloride 75 mL/hr at 05/25/11 0240  . DISCONTD: sodium chloride     PRN Meds:.albuterol, ipratropium, menthol-cetylpyridinium, methocarbamol(ROBAXIN) IV, methocarbamol, metoCLOPramide (REGLAN) injection, metoCLOPramide, morphine injection, ondansetron (ZOFRAN) IV, ondansetron, oxyCODONE, phenol, senna-docusate, DISCONTD: albuterol  Antibiotics: Anti-infectives     Start     Dose/Rate Route Frequency Ordered Stop   05/24/11 1430   ceFAZolin (ANCEF) IVPB 2 g/50 mL premix        2 g 100 mL/hr over 30 Minutes Intravenous Every 6 hours 05/24/11 1421 05/25/11 0311   05/24/11 1423   ceFAZolin (ANCEF) 1-5 GM-% IVPB     Comments: LOPEZ, PHILLIP: cabinet override         05/24/11 1423 05/24/11 1430   05/23/11 1515   ceFAZolin (ANCEF) IVPB 2 g/50 mL premix  Status:  Discontinued         2 g 100 mL/hr over 30 Minutes Intravenous 60 min pre-op 05/23/11 1512 05/24/11 1447          Assessment/Plan: Patient Active Hospital Problem List: 1.S/P Right Knee Replacement: -Post op care per Primary team  2.COPD -better, lungs clear today -continue with current treatment regimen -encourage incentive spirometry  3. HTN -better control today -continue with coreg,HCTZ and Benicar-will titrate as needed  4. CHF -mild systolic dysfunction -euvolemic on exam-toleratiing oral intake  5.Dyslipidemia -continue with Statin.  Disposition: Home with Home health services, possibly tomorrow  DVT Prophylaxis: Subcutaneous Lovenox  Code Status: Full code  Maretta Bees, MD. 05/26/2011, 1:54 PM

## 2011-05-26 NOTE — Progress Notes (Signed)
Physical Therapy Treatment Patient Details Name: Grant Flores MRN: [id number] DOB: 06/30/1940 Today's Date: 05/26/2011  PT Assessment/Plan  PT - Assessment/Plan Comments on Treatment Session: Pt with c/o nausea.  Per MD progress note, pt will possible d/c home tomorrow. PT Plan: Discharge plan remains appropriate PT Frequency: 7X/week Follow Up Recommendations: Home health PT Equipment Recommended: None recommended by PT PT Goals  Acute Rehab PT Goals PT Goal: Sit to Supine/Side - Progress: Progressing toward goal PT Goal: Sit to Stand - Progress: Progressing toward goal PT Goal: Stand to Sit - Progress: Progressing toward goal PT Transfer Goal: Bed to Chair/Chair to Bed - Progress: Progressing toward goal  PT Treatment Precautions/Restrictions  Precautions Precautions: Knee;Fall Required Braces or Orthoses: Yes Knee Immobilizer:  (as tolerated) Restrictions Weight Bearing Restrictions: Yes RLE Weight Bearing: Weight bearing as tolerated Mobility (including Balance) Bed Mobility Bed Mobility: Yes Sit to Supine - Right: 4: Min assist;HOB flat Sit to Supine - Right Details (indicate cue type and reason): verbal cues for sequencing Transfers Sit to Stand: 4: Min assist;From chair/3-in-1;With armrests Sit to Stand Details (indicate cue type and reason): verbal cues for sequencing Stand to Sit: 4: Min assist;To bed Stand to Sit Details: verbal cues for sequencing Stand Pivot Transfers: 4: Min assist Stand Pivot Transfer Details (indicate cue type and reason): verbal cues for sequencing Ambulation/Gait Ambulation/Gait: No    Exercise    End of Session PT - End of Session Equipment Utilized During Treatment: Gait belt Activity Tolerance: Patient tolerated treatment well Patient left: in bed;with call bell in reach;with family/visitor present General Behavior During Session: [facility] for tasks performed Cognition: [facility] for tasks performed  Ilda Foil 05/26/2011, 3:40  PM

## 2011-05-27 LAB — CBC
MCH: 29.6 pg (ref 26.0–34.0)
Platelets: 204 10*3/uL (ref 150–400)
RBC: 3.35 MIL/uL — ABNORMAL LOW (ref 4.22–5.81)
WBC: 12.8 10*3/uL — ABNORMAL HIGH (ref 4.0–10.5)

## 2011-05-27 MED ORDER — OXYCODONE-ACETAMINOPHEN 5-325 MG PO TABS: ORAL_TABLET | ORAL | Status: DC

## 2011-05-27 MED ORDER — ENOXAPARIN SODIUM 30 MG/0.3ML ~~LOC~~ SOLN: 30.0000 mg | Freq: Two times a day (BID) | SUBCUTANEOUS | Status: DC

## 2011-05-27 NOTE — Progress Notes (Signed)
Physical Therapy Treatment Patient Details Name: Grant Flores MRN: [id number] DOB: [birth year] Today's Date: 05/27/2011  PT Assessment/Plan  PT - Assessment/Plan Comments on Treatment Session: Pt progressing very well this morning with PT.  PT Plan: Discharge plan remains appropriate PT Frequency: 7X/week Follow Up Recommendations: Home health PT Equipment Recommended: None recommended by PT PT Goals  Acute Rehab PT Goals PT Goal: Sit to Stand - Progress: Progressing toward goal PT Goal: Stand to Sit - Progress: Progressing toward goal PT Transfer Goal: Bed to Chair/Chair to Bed - Progress: Progressing toward goal PT Goal: Ambulate - Progress: Progressing toward goal PT Goal: Up/Down Stairs - Progress: Other (comment) (unassessed. Pt states he has no stairs to enter house) PT Goal: Perform Home Exercise Program - Progress: Progressing toward goal  PT Treatment Precautions/Restrictions  Precautions Precautions: Knee Required Braces or Orthoses: Yes Knee Immobilizer:  (as tolerated) Restrictions Weight Bearing Restrictions: Yes RLE Weight Bearing: Weight bearing as tolerated Mobility (including Balance) Bed Mobility Bed Mobility: No Transfers Sit to Stand: 5: Supervision;With upper extremity assist;With armrests;From chair/3-in-1 Sit to Stand Details (indicate cue type and reason): Cues for safety Stand to Sit: 5: Supervision;To chair/3-in-1;With armrests;With upper extremity assist Stand to Sit Details: Cues to sit slowly Ambulation/Gait Ambulation/Gait: Yes Ambulation/Gait Assistance: Other (comment) (MinGuardA) Ambulation/Gait Assistance Details (indicate cue type and reason): Cues for gait sequence and safety with RW positioning Ambulation Distance (Feet): 180 Feet Assistive device: Rolling walker Gait Pattern: Step-to pattern;Decreased stride length    Exercise  Total Joint Exercises Quad Sets: AROM;Right;10 reps Short Arc Quad: AROM;Right;10 reps Heel Slides:  AAROM;Right;10 reps Hip ABduction/ADduction: AROM;Right;10 reps Straight Leg Raises: AAROM;Right;10 reps End of Session PT - End of Session Equipment Utilized During Treatment: Gait belt Activity Tolerance: Patient tolerated treatment well Patient left: in chair;with call bell in reach Nurse Communication: Mobility status for transfers;Mobility status for ambulation General Behavior During Session: [facility] for tasks performed Cognition: [facility] for tasks performed  Fredrich Birks 05/27/2011, 11:25 AM 05/27/2011 Fredrich Birks PTA [phone] pager [phone] office

## 2011-05-27 NOTE — Progress Notes (Signed)
PATIENT DETAILS Name: Grant Flores Age: 70 y.o. Sex: male Date of Birth: 1940/09/01 Admit Date: 05/24/2011 WUJ:[id number],NFA Maisie Fus, MD, MD  Subjective: Dressed up and ready to go home. No major complaints  Objective: Vital signs in last 24 hours: Filed Vitals:   05/26/11 2049 05/26/11 2257 05/27/11 0601 05/27/11 0911  BP:  145/89 140/80   Pulse:  97 93   Temp:  99 F (37.2 C) 99 F (37.2 C)   TempSrc:  Oral Oral   Resp:  18 18   Height:      Weight:      SpO2: 94% 93% 95% 97%    Weight change:   Body mass index is 34.30 kg/(m^2).  Intake/Output from previous day:  Intake/Output Summary (Last 24 hours) at 05/27/11 1324 Last data filed at 05/27/11 0202  Gross per 24 hour  Intake    480 ml  Output    750 ml  Net   -270 ml    PHYSICAL EXAM: Gen Exam: Awake and alert with clear speech.   Neck: Supple, No JVD.   Chest: B/L Clear CVS: S1 S2 Regular, no murmurs.  Abdomen: soft, BS +, non tender, slightly distended.  Extremities: no edema, lower extremities warm to touch.Rt leg wrapped. Neurologic: Non Focal.   Skin: No Rash.   Wounds: N/A.    CONSULTS:  none  LAB RESULTS: CBC  Lab 05/27/11 0650 05/26/11 0630 05/25/11 0500 05/24/11 2127  WBC 12.8* 13.3* 12.0* 11.6*  HGB 9.9* 10.1* 12.1* 12.6*  HCT 29.7* 30.1* 37.1* 37.5*  PLT 204 192 213 219  MCV 88.7 89.9 90.0 89.3  MCH 29.6 30.1 29.4 30.0  MCHC 33.3 33.6 32.6 33.6  RDW 14.4 14.5 14.6 14.3  LYMPHSABS -- -- -- --  MONOABS -- -- -- --  EOSABS -- -- -- --  BASOSABS -- -- -- --  BANDABS -- -- -- --    Chemistries   Lab 05/25/11 0500 05/24/11 2127  NA 137 137  K 3.5 3.7  CL 103 102  CO2 27 26  GLUCOSE 108* 168*  BUN 9 11  CREATININE 0.67 0.71  CALCIUM 8.3* 8.3*  MG -- --    GFR Estimated Creatinine Clearance: 96.5 ml/min (by C-G formula based on Cr of 0.67).  Coagulation profile  Lab 05/24/11 2127  INR 1.07  PROTIME --    Cardiac Enzymes No results found for this basename:  CK:3,CKMB:3,TROPONINI:3,MYOGLOBIN:3 in the last 168 hours  No components found with this basename: POCBNP:3 No results found for this basename: DDIMER:2 in the last 72 hours No results found for this basename: HGBA1C:2 in the last 72 hours No results found for this basename: CHOL:2,HDL:2,LDLCALC:2,TRIG:2,CHOLHDL:2,LDLDIRECT:2 in the last 72 hours  Basename 05/25/11 0045  TSH 0.793  T4TOTAL --  T3FREE --  THYROIDAB --   No results found for this basename: VITAMINB12:2,FOLATE:2,FERRITIN:2,TIBC:2,IRON:2,RETICCTPCT:2 in the last 72 hours No results found for this basename: LIPASE:2,AMYLASE:2 in the last 72 hours  Urine Studies No results found for this basename: UACOL:2,UAPR:2,USPG:2,UPH:2,UTP:2,UGL:2,UKET:2,UBIL:2,UHGB:2,UNIT:2,UROB:2,ULEU:2,UEPI:2,UWBC:2,URBC:2,UBAC:2,CAST:2,CRYS:2,UCOM:2,BILUA:2 in the last 72 hours  MICROBIOLOGY: No results found for this or any previous visit (from the past 240 hour(s)).  RADIOLOGY STUDIES/RESULTS: Dg Chest 2 View  05/15/2011  *RADIOLOGY REPORT*  Clinical Data: Preop respiratory exam for knee surgery.  CHEST - 2 VIEW  Comparison: Chest x-ray 09/02/2008.  Findings: The cardiac silhouette, mediastinal and hilar contours are stable.  There are mild bronchitic type lung changes but no acute pulmonary findings.  No pleural effusions.  The bony  thorax is intact.  IMPRESSION: Mild chronic bronchitic type lung changes but no acute cardiopulmonary findings.  Original Report Authenticated By: P. Loralie Champagne, M.D.   Dg Chest Port 1 View  05/25/2011  *RADIOLOGY REPORT*  Clinical Data: Wheezing post knee surgery.  COPD.  PORTABLE CHEST - 1 VIEW  Comparison: 05/15/2011  Findings: Shallow inspiration. The heart size and pulmonary vascularity are normal. The lungs appear clear and expanded without focal air space disease or consolidation. No blunting of the costophrenic angles.  No pneumothorax.  IMPRESSION: No evidence of active pulmonary disease.  Original Report  Authenticated By: Marlon Pel, M.D.   X-ray Knee Right Port  05/24/2011  *RADIOLOGY REPORT*  Clinical Data: Post arthroplasty.  PORTABLE RIGHT KNEE - 1-2 VIEW  Comparison: None.  Findings: Total right knee replacement appears in satisfactory position.  Surgical drain is in place.  Radiopaque material projects superior to the tibial tuberosity, exact etiology indeterminate.  Clinical correlation recommended.  IMPRESSION: Total right knee replacement appears in satisfactory position. Surgical drain is in place.  Radiopaque material projects superior to the tibial tuberosity, exact etiology indeterminate.  Clinical correlation recommended.  Original Report Authenticated By: Fuller Canada, M.D.    MEDICATIONS: Scheduled Meds:    . aspirin EC  325 mg Oral Daily  . carvedilol  6.25 mg Oral BID  . docusate sodium  100 mg Oral BID  . enoxaparin  30 mg Subcutaneous Q12H  . Fluticasone-Salmeterol  1 puff Inhalation BID  . hydrochlorothiazide  12.5 mg Oral Daily  . nicotine  14 mg Transdermal QHS  . olmesartan  10 mg Oral Daily  . pantoprazole  40 mg Oral Q1200  . simvastatin  20 mg Oral q1800   Continuous Infusions:   PRN Meds:.albuterol, ipratropium, menthol-cetylpyridinium, methocarbamol(ROBAXIN) IV, methocarbamol, metoCLOPramide (REGLAN) injection, metoCLOPramide, morphine injection, ondansetron (ZOFRAN) IV, ondansetron, oxyCODONE, phenol, senna-docusate  Antibiotics: Anti-infectives     Start     Dose/Rate Route Frequency Ordered Stop   05/24/11 1430   ceFAZolin (ANCEF) IVPB 2 g/50 mL premix        2 g 100 mL/hr over 30 Minutes Intravenous Every 6 hours 05/24/11 1421 05/25/11 0311   05/24/11 1423   ceFAZolin (ANCEF) 1-5 GM-% IVPB     Comments: LOPEZ, PHILLIP: cabinet override         05/24/11 1423 05/24/11 1430   05/23/11 1515   ceFAZolin (ANCEF) IVPB 2 g/50 mL premix  Status:  Discontinued        2 g 100 mL/hr over 30 Minutes Intravenous 60 min pre-op 05/23/11 1512 05/24/11  1447          Assessment/Plan: Patient Active Hospital Problem List: 1.S/P Right Knee Replacement: -Post op care per Primary team  2.COPD -better, lungs clear today -continue with inhaler at discharge -encourage incentive spirometry  3. HTN -better control today -continue with coreg,HCTZ and Benicar-at discharge 4. CHF -mild systolic dysfunction -euvolemic on exam-toleratiing oral intake  5.Dyslipidemia -continue with Statin.   Will sign off-please have patient follow up with PCP/Pr Cardiologist   Maretta Bees, MD. 05/27/2011, 1:24 PM

## 2011-05-27 NOTE — Progress Notes (Signed)
Discharge planning. Patient preoperatively setup with [facility],no changes. Has DME.

## 2011-05-27 NOTE — Discharge Summary (Signed)
PATIENT ID:      Grant Flores  MRN:     [id number] DOB/AGE:    September 03, 1940 / 70 y.o.     DISCHARGE SUMMARY  ADMISSION DATE:    05/24/2011 DISCHARGE DATE:   05/27/2011   ADMISSION DIAGNOSIS: OSTEOARTHRITIS RIGHT KNEE  (OSTEOARTHRITIS RIGHT KNEE)  DISCHARGE DIAGNOSIS:  OSTEOARTHRITIS RIGHT KNEE    ADDITIONAL DIAGNOSIS: Active Problems:  * No active hospital problems. *   Past Medical History  Diagnosis Date  . CAD (coronary artery disease) October 2008    s/p inferolateral STEMI.... PCI with BMS to LCX. (cath with EF 50%, mod inf HK, RCA 40%, small oD3 70%, OM1 40%, CFX occluded - tx with PCI);  echo 10/08: EF 45%, mild RVE;  Myoview 2009. Inferior infarct. no ischemia, EF 45%  . Hypertension   . High cholesterol   . COPD (chronic obstructive pulmonary disease)     with ongoing tobacco  . Obesity   . Myocardial infarction 2008  . Asthma   . Shortness of breath     with exertion     PROCEDURE: Procedure(s): TOTAL KNEE ARTHROPLASTY on 05/24/2011  CONSULTS: Treatment Team:  Shanker Ghimire   HISTORY:  See H&P in chart  HOSPITAL COURSE:  Grant Flores is a 70 y.o. admitted on 05/24/2011 and found to have a diagnosis of OSTEOARTHRITIS RIGHT KNEE.  After appropriate laboratory studies were obtained  they were taken to the operating room on 05/24/2011 and underwent Procedure(s): TOTAL KNEE ARTHROPLASTY.   They were given perioperative antibiotics:  Anti-infectives     Start     Dose/Rate Route Frequency Ordered Stop   05/24/11 1430   ceFAZolin (ANCEF) IVPB 2 g/50 mL premix        2 g 100 mL/hr over 30 Minutes Intravenous Every 6 hours 05/24/11 1421 05/25/11 0311   05/24/11 1423   ceFAZolin (ANCEF) 1-5 GM-% IVPB     Comments: LOPEZ, PHILLIP: cabinet override         05/24/11 1423 05/24/11 1430   05/23/11 1515   ceFAZolin (ANCEF) IVPB 2 g/50 mL premix  Status:  Discontinued        2 g 100 mL/hr over 30 Minutes Intravenous 60 min pre-op 05/23/11 1512 05/24/11 1447         . Blood products given:none  The remainder of the hospital course was dedicated to ambulation and strengthening.   The patient was discharged on 3 Days Post-Op in  Stable condition.   DIAGNOSTIC STUDIES: Recent vital signs: Patient Vitals for the past 24 hrs:  BP Temp Temp src Pulse Resp SpO2  05/27/11 0911 - - - - - 97 %  05/27/11 0601 140/80 mmHg 99 F (37.2 C) Oral 93  18  95 %  2011-06-14 2257 145/89 mmHg 99 F (37.2 C) Oral 97  18  93 %  2011-06-14 2049 - - - - - 94 %  2011-06-14 1500 120/66 mmHg 99.7 F (37.6 C) Oral 87  16  92 %       Recent laboratory studies:  Basename 05/27/11 0650 14-Jun-2011 0630 05/25/11 0500 05/24/11 2127  WBC 12.8* 13.3* 12.0* 11.6*  HGB 9.9* 10.1* 12.1* 12.6*  HCT 29.7* 30.1* 37.1* 37.5*  PLT 204 192 213 219    Basename 05/25/11 0500 05/24/11 2127  NA 137 137  K 3.5 3.7  CL 103 102  CO2 27 26  BUN 9 11  CREATININE 0.67 0.71  GLUCOSE 108* 168*  CALCIUM 8.3* 8.3*  Lab Results  Component Value Date   INR 1.07 05/24/2011   INR 1.06 05/15/2011   INR 1.1 08/23/2008     Recent Radiographic Studies :  Dg Chest 2 View  05/15/2011  *RADIOLOGY REPORT*  Clinical Data: Preop respiratory exam for knee surgery.  CHEST - 2 VIEW  Comparison: Chest x-ray 09/02/2008.  Findings: The cardiac silhouette, mediastinal and hilar contours are stable.  There are mild bronchitic type lung changes but no acute pulmonary findings.  No pleural effusions.  The bony thorax is intact.  IMPRESSION: Mild chronic bronchitic type lung changes but no acute cardiopulmonary findings.  Original Report Authenticated By: P. Loralie Champagne, M.D.   Dg Chest Port 1 View  05/25/2011  *RADIOLOGY REPORT*  Clinical Data: Wheezing post knee surgery.  COPD.  PORTABLE CHEST - 1 VIEW  Comparison: 05/15/2011  Findings: Shallow inspiration. The heart size and pulmonary vascularity are normal. The lungs appear clear and expanded without focal air space disease or consolidation. No blunting of the  costophrenic angles.  No pneumothorax.  IMPRESSION: No evidence of active pulmonary disease.  Original Report Authenticated By: Marlon Pel, M.D.   X-ray Knee Right Port  05/24/2011  *RADIOLOGY REPORT*  Clinical Data: Post arthroplasty.  PORTABLE RIGHT KNEE - 1-2 VIEW  Comparison: None.  Findings: Total right knee replacement appears in satisfactory position.  Surgical drain is in place.  Radiopaque material projects superior to the tibial tuberosity, exact etiology indeterminate.  Clinical correlation recommended.  IMPRESSION: Total right knee replacement appears in satisfactory position. Surgical drain is in place.  Radiopaque material projects superior to the tibial tuberosity, exact etiology indeterminate.  Clinical correlation recommended.  Original Report Authenticated By: Fuller Canada, M.D.    DISCHARGE INSTRUCTIONS:   DISCHARGE MEDICATIONS:  Current Discharge Medication List    START taking these medications   Details  enoxaparin (LOVENOX) 30 MG/0.3ML SOLN Inject 0.3 mLs (30 mg total) into the skin every 12 (twelve) hours. Qty: 50 Syringe, Refills: 0    oxyCODONE-acetaminophen (ROXICET) 5-325 MG per tablet 1-2 tabs po q4-6hrs prn pain Qty: 90 tablet, Refills: 0      CONTINUE these medications which have NOT CHANGED   Details  atorvastatin (LIPITOR) 80 MG tablet Take 1 tablet (80 mg total) by mouth daily. Qty: 30 tablet, Refills: 11    carvedilol (COREG) 6.25 MG tablet Take 1 tablet (6.25 mg total) by mouth 2 (two) times daily. Qty: 60 tablet, Refills: 11    valsartan-hydrochlorothiazide (DIOVAN-HCT) 80-12.5 MG per tablet Take 1 tablet by mouth daily.        STOP taking these medications     aspirin EC 325 MG tablet      meloxicam (MOBIC) 7.5 MG tablet         FOLLOW UP VISIT:   Follow-up Information    Follow up with CAFFREY JR,W D, MD. (follow up on 06/06/11 call to schedule)    Contact information:   [facility], [facility] [address] [place] [address] [phone]          DISPOSITION:  Home    CONDITION:  Stable   Margart Sickles 05/27/2011, 1:08 PM

## 2011-05-28 ENCOUNTER — Encounter (HOSPITAL_COMMUNITY): Payer: Self-pay | Admitting: Orthopedic Surgery

## 2011-09-16 ENCOUNTER — Telehealth: Payer: Self-pay | Admitting: Internal Medicine

## 2011-09-16 NOTE — Telephone Encounter (Signed)
F/U   Tina Griffiths from [facility] returning nurse call she can be reached at [phone] Ext [phone]

## 2011-09-16 NOTE — Telephone Encounter (Signed)
Please return call to [facility], regarding patient DX  Grant Flores [phone] Ext [phone]

## 2011-10-01 NOTE — Telephone Encounter (Signed)
Called several times, can only get voice mail

## 2011-10-03 ENCOUNTER — Other Ambulatory Visit: Payer: Self-pay | Admitting: *Deleted

## 2011-10-03 MED ORDER — CARVEDILOL 6.25 MG PO TABS: 6.2500 mg | ORAL_TABLET | Freq: Two times a day (BID) | ORAL | Status: DC

## 2011-10-03 NOTE — Telephone Encounter (Signed)
Refilled carvedilol.

## 2012-01-17 ENCOUNTER — Encounter: Payer: Self-pay | Admitting: Internal Medicine

## 2012-04-29 ENCOUNTER — Encounter: Payer: Self-pay | Admitting: Cardiovascular Disease

## 2012-05-01 ENCOUNTER — Other Ambulatory Visit: Payer: Self-pay | Admitting: Cardiovascular Disease

## 2012-06-01 ENCOUNTER — Other Ambulatory Visit: Payer: Self-pay | Admitting: Cardiovascular Disease

## 2012-06-19 ENCOUNTER — Encounter: Payer: Self-pay | Admitting: Cardiovascular Disease

## 2012-06-19 ENCOUNTER — Ambulatory Visit (INDEPENDENT_AMBULATORY_CARE_PROVIDER_SITE_OTHER): Payer: Medicare Other | Admitting: Cardiovascular Disease

## 2012-06-19 VITALS — BP 142/80 | HR 64 | Ht 69.0 in | Wt 218.0 lb

## 2012-06-19 DIAGNOSIS — I2510 Atherosclerotic heart disease of native coronary artery without angina pectoris: Secondary | ICD-10-CM

## 2012-06-19 NOTE — Patient Instructions (Addendum)
Your physician wants you to follow-up in: 1 YEAR with Dr Cooper.  You will receive a reminder letter in the mail two months in advance. If you don't receive a letter, please call our office to schedule the follow-up appointment.  Your physician recommends that you continue on your current medications as directed. Please refer to the Current Medication list given to you today.  

## 2012-06-19 NOTE — Progress Notes (Signed)
   HPI:  72 year old gentleman presenting for cardiac followup evaluation. The patient has coronary artery disease with history of PCI in 2008 when he presented with an inferolateral MI. His only symptom was throat pain. He said no symptoms of recurrent angina. The patient continues to smoke cigarettes apparently has COPD. He denies dyspnea, Grant Flores, Grant Flores, Grant leg swelling. He takes his medications regularly.  He brings in lab work from his primary care physician demonstrated a cholesterol of Flores, triglycerides 68, HDL 38, and LDL of 44. His renal function is normal with a creatinine of 0.8. Liver function tests are within normal limits.  Outpatient Encounter Prescriptions as of 06/19/2012  Medication Sig Dispense Refill  . Aclidinium Bromide (TUDORZA PRESSAIR) 400 MCG/ACT AEPB Inhale into the lungs.      [name] amLODipine (NORVASC) 2.5 MG tablet       . aspirin 325 MG tablet Take 325 mg by mouth daily.      [name] atorvastatin (LIPITOR) 80 MG tablet TAKE ONE TABLET BY MOUTH ONE TIME DAILY  30 tablet  1  . carvedilol (COREG) 6.25 MG tablet Take 1 tablet (6.25 mg total) by mouth 2 (two) times daily.  60 tablet  11  . [DISCONTINUED] atorvastatin (LIPITOR) 80 MG tablet TAKE ONE TABLET BY MOUTH ONE TIME DAILY  30 tablet  10  . [DISCONTINUED] enoxaparin (LOVENOX) 30 MG/0.3ML SOLN Inject 0.3 mLs (30 mg total) into the skin every 12 (twelve) hours.  50 Syringe  0  . [DISCONTINUED] oxyCODONE-acetaminophen (ROXICET) 5-325 MG per tablet 1-2 tabs po q4-6hrs prn pain  90 tablet  0  . [DISCONTINUED] valsartan-hydrochlorothiazide (DIOVAN-HCT) 80-12.5 MG per tablet Take 1 tablet by mouth daily.          No Known Allergies  Past Medical History  Diagnosis Date  . CAD (coronary artery disease) October 2008    s/p inferolateral STEMI.... PCI with BMS to LCX. (cath with EF 50%, mod inf HK, RCA 40%, small oD3 70%, OM1 40%, CFX occluded - tx with PCI);  echo 10/08: EF 45%, mild RVE;  Myoview 2009. Inferior infarct. no  ischemia, EF 45%  . Hypertension   . High cholesterol   . COPD (chronic obstructive pulmonary disease)     with ongoing tobacco  . Obesity   . Myocardial infarction 2008  . Asthma   . Shortness of breath     with exertion     ROS: Negative except as per HPI  BP 142/80  Pulse 64  Ht 5\' 9"  (1.753 m)  Wt 98.884 kg (218 lb)  BMI 32.19 kg/m2  SpO2 Flores%  PHYSICAL EXAM: Pt is alert and oriented, NAD HEENT: normal Neck: JVP - normal, carotids 2+= without bruits Lungs: Coarse breath sounds with scattered rhonchi CV: RRR without murmur Grant gallop Abd: soft, NT, Positive BS, no hepatomegaly Ext: no C/C/E, distal pulses intact and equal Skin: warm/dry no rash  EKG:  Normal sinus rhythm 59 beats per minute, left anterior fascicular block, lateral infarct age undetermined.  ASSESSMENT AND PLAN: 1. Coronary artery disease, native vessel. The patient remained stable without anginal symptoms. He understands the importance of tobacco cessation. He should continue on aspirin for antiplatelet therapy, atorvastatin for lipid-lowering, and carvedilol. He will followup in one year.  2. Hyperlipidemia. Lipids reviewed. He is managed by Dr. Pete Glatter. He is appropriately on high-dose atorvastatin.  3. Hypertension. Managed on a combination of amlodipine and carvedilol.  Grant Flores 06/19/2012 1:59 PM

## 2012-07-01 ENCOUNTER — Telehealth: Payer: Self-pay | Admitting: Cardiovascular Disease

## 2012-07-01 NOTE — Telephone Encounter (Signed)
Pt needs refill of lipitor, [facility] [address]

## 2012-07-02 ENCOUNTER — Other Ambulatory Visit: Payer: Self-pay

## 2012-07-02 MED ORDER — ATORVASTATIN CALCIUM 80 MG PO TABS: 80.0000 mg | ORAL_TABLET | Freq: Every day | ORAL | Status: DC

## 2012-09-25 ENCOUNTER — Other Ambulatory Visit: Payer: Self-pay | Admitting: *Deleted

## 2012-09-25 MED ORDER — CARVEDILOL 6.25 MG PO TABS: 6.2500 mg | ORAL_TABLET | Freq: Two times a day (BID) | ORAL | Status: DC

## 2012-09-25 MED ORDER — ATORVASTATIN CALCIUM 80 MG PO TABS: 80.0000 mg | ORAL_TABLET | Freq: Every day | ORAL | Status: DC

## 2012-09-25 NOTE — Telephone Encounter (Signed)
Fax Received. Refill Completed. Kienna Moncada Chowoe (R.M.A)   

## 2013-04-26 ENCOUNTER — Other Ambulatory Visit: Payer: Self-pay | Admitting: Cardiovascular Disease

## 2013-06-24 ENCOUNTER — Ambulatory Visit: Payer: Medicare Other | Admitting: Cardiovascular Disease

## 2013-06-30 ENCOUNTER — Other Ambulatory Visit: Payer: Self-pay | Admitting: Cardiovascular Disease

## 2013-07-21 ENCOUNTER — Ambulatory Visit (INDEPENDENT_AMBULATORY_CARE_PROVIDER_SITE_OTHER): Payer: Medicare Other | Admitting: Cardiovascular Disease

## 2013-07-21 ENCOUNTER — Encounter: Payer: Self-pay | Admitting: Cardiovascular Disease

## 2013-07-21 VITALS — BP 150/82 | HR 65 | Ht 69.0 in | Wt 220.1 lb

## 2013-07-21 DIAGNOSIS — I509 Heart failure, unspecified: Secondary | ICD-10-CM

## 2013-07-21 DIAGNOSIS — I2510 Atherosclerotic heart disease of native coronary artery without angina pectoris: Secondary | ICD-10-CM

## 2013-07-21 DIAGNOSIS — I10 Essential (primary) hypertension: Secondary | ICD-10-CM

## 2013-07-21 DIAGNOSIS — E782 Mixed hyperlipidemia: Secondary | ICD-10-CM

## 2013-07-21 MED ORDER — ATORVASTATIN CALCIUM 80 MG PO TABS: 80.0000 mg | ORAL_TABLET | Freq: Every day | ORAL | Status: DC

## 2013-07-21 MED ORDER — CARVEDILOL 6.25 MG PO TABS: 6.2500 mg | ORAL_TABLET | Freq: Two times a day (BID) | ORAL | Status: DC

## 2013-07-21 MED ORDER — AMLODIPINE BESYLATE 5 MG PO TABS: 5.0000 mg | ORAL_TABLET | Freq: Every day | ORAL | Status: DC

## 2013-07-21 NOTE — Patient Instructions (Signed)
Your physician has recommended you make the following change in your medication:  INCREASE Amlodipine to 5 mg once daily  Your physician wants you to follow-up in: 1 year with Dr. Excell Seltzerooper.  You will receive a reminder letter in the mail two months in advance. If you don't receive a letter, please call our office to schedule the follow-up appointment.

## 2013-07-21 NOTE — Progress Notes (Signed)
    HPI:  73 year old gentleman presenting for cardiac followup evaluation. The patient has coronary artery disease with history of PCI in 2008 when he presented with an inferolateral MI. His only symptom was throat pain. He's had no symptoms of recurrent angina.  The patient has had a lot of stress. Things are very tight for him financially. He has mild shortness of breath without change. He denies edema, orthopnea, or PND. He's had no chest pain or pressure. He's had no recurrent throat pain like that if his myocardial infarction. He's compliant with his medicines. Unfortunately he continues to smoke but has tried to slow down.   Outpatient Encounter Prescriptions as of 07/21/2013  Medication Sig  . Aclidinium Bromide (TUDORZA PRESSAIR) 400 MCG/ACT AEPB Inhale into the lungs.  [name]. amLODipine (NORVASC) 2.5 MG tablet   . aspirin 325 MG tablet Take 325 mg by mouth daily.  [name]. atorvastatin (LIPITOR) 80 MG tablet Take 1 tablet (80 mg total) by mouth daily.  . carvedilol (COREG) 6.25 MG tablet TAKE ONE TABLET BY MOUTH TWICE DAILY     No Known Allergies  Past Medical History  Diagnosis Date  . CAD (coronary artery disease) October 2008    s/p inferolateral STEMI.... PCI with BMS to LCX. (cath with EF 50%, mod inf HK, RCA 40%, small oD3 70%, OM1 40%, CFX occluded - tx with PCI);  echo 10/08: EF 45%, mild RVE;  Myoview 2009. Inferior infarct. no ischemia, EF 45%  . Hypertension   . High cholesterol   . COPD (chronic obstructive pulmonary disease)     with ongoing tobacco  . Obesity   . Myocardial infarction 2008  . Asthma   . Shortness of breath     with exertion     ROS: Negative except as per HPI  BP 150/82  Pulse 65  Ht 5\' 9"  (1.753 m)  Wt 220 lb 1.9 oz (99.846 kg)  BMI 32.49 kg/m2  PHYSICAL EXAM: Pt is alert and oriented, pleasant obese male in NAD HEENT: normal Neck: JVP - normal, carotids 2+= without bruits Lungs: CTA bilaterally CV: RRR without murmur or gallop Abd: soft, NT,  Positive BS, obese Ext: no C/C/E, distal pulses intact and equal Skin: warm/dry no rash  EKG:  Normal sinus rhythm 65 beats per minute, left anterior fascicular block, lateral infarction age undetermined.  ASSESSMENT AND PLAN: 1. Coronary artery disease, native vessel. Continue aspirin, atorvastatin, and carvedilol. No recurrence of angina. Followup in 1 year.  2. Hypertension. Blood pressure control is suboptimal. Diet and lifestyle could be improved. Increase amlodipine to 5 mg daily.  3. Hyperlipidemia. Followed by his primary physician. He takes a high intensity statin drug with atorvastatin 80 mg.  For followup I will see him back in one year.  Tonny BollmanMichael Jonahtan Manseau 07/21/2013 3:39 PM

## 2014-03-16 ENCOUNTER — Other Ambulatory Visit: Payer: Self-pay | Admitting: Nurse Practitioner

## 2014-03-16 ENCOUNTER — Ambulatory Visit
Admission: RE | Admit: 2014-03-16 | Discharge: 2014-03-16 | Disposition: A | Discharge status: Home / Self Care | Payer: Medicare Other | Source: Ambulatory Visit | Attending: Nurse Practitioner | Admitting: Nurse Practitioner

## 2014-03-16 DIAGNOSIS — R0602 Shortness of breath: Secondary | ICD-10-CM

## 2014-07-18 ENCOUNTER — Other Ambulatory Visit: Payer: Self-pay | Admitting: *Deleted

## 2014-07-18 DIAGNOSIS — I10 Essential (primary) hypertension: Secondary | ICD-10-CM

## 2014-07-18 DIAGNOSIS — E782 Mixed hyperlipidemia: Secondary | ICD-10-CM

## 2014-07-18 MED ORDER — CARVEDILOL 6.25 MG PO TABS: 6.2500 mg | ORAL_TABLET | Freq: Two times a day (BID) | ORAL | Status: DC

## 2014-07-18 MED ORDER — AMLODIPINE BESYLATE 5 MG PO TABS: 5.0000 mg | ORAL_TABLET | Freq: Every day | ORAL | Status: DC

## 2014-07-18 MED ORDER — ATORVASTATIN CALCIUM 80 MG PO TABS: 80.0000 mg | ORAL_TABLET | Freq: Every day | ORAL | Status: DC

## 2014-09-28 ENCOUNTER — Ambulatory Visit (INDEPENDENT_AMBULATORY_CARE_PROVIDER_SITE_OTHER): Payer: Medicare Other | Admitting: Cardiovascular Disease

## 2014-09-28 ENCOUNTER — Encounter: Payer: Self-pay | Admitting: Cardiovascular Disease

## 2014-09-28 VITALS — BP 138/78 | HR 57 | Ht 69.0 in | Wt 219.1 lb

## 2014-09-28 DIAGNOSIS — I10 Essential (primary) hypertension: Secondary | ICD-10-CM

## 2014-09-28 DIAGNOSIS — I2510 Atherosclerotic heart disease of native coronary artery without angina pectoris: Secondary | ICD-10-CM

## 2014-09-28 DIAGNOSIS — E782 Mixed hyperlipidemia: Secondary | ICD-10-CM | POA: Diagnosis not present

## 2014-09-28 MED ORDER — CARVEDILOL 6.25 MG PO TABS: 6.2500 mg | ORAL_TABLET | Freq: Two times a day (BID) | ORAL | Status: DC

## 2014-09-28 MED ORDER — ASPIRIN 81 MG PO TABS: 81.0000 mg | ORAL_TABLET | Freq: Every day | ORAL | Status: DC

## 2014-09-28 MED ORDER — AMLODIPINE BESYLATE 5 MG PO TABS: 5.0000 mg | ORAL_TABLET | Freq: Every day | ORAL | Status: DC

## 2014-09-28 MED ORDER — ATORVASTATIN CALCIUM 80 MG PO TABS: 80.0000 mg | ORAL_TABLET | Freq: Every day | ORAL | Status: DC

## 2014-09-28 NOTE — Progress Notes (Signed)
Cardiology Office Note   Date:  09/28/2014   ID:  Grant KellyRobert L Basista, DOB 08/17/1940, MRN [id number]  PCP:  Ginette OttoSTONEKING,HAL THOMAS, MD  Cardiologist:  Tonny BollmanMichael Carloyn Lahue, MD    Chief Complaint  Patient presents with  . Medication Refill    History of Present Illness: Grant KellyRobert L Flores is a 74 y.o. male who presents for follow-up of coronary artery disease. He presented with an acute inferolateral MI in 2008 and was treated with stenting of the left circumflex (bare-metal stent). His only symptom at that time was throat pain. He has had no recurrence of his anginal symptoms. The patient was seen last in February 2015.  He reports feeling well. Still smoking - has cut back to 1/2 ppd. He's had no recurrence of throat pain since his MI. Denies chest pain or pressure,  edema, or heart palpitations. No change in medications. He admits to shortness of breath with exertion, unchanged for many years. No other complaints today.  Past Medical History  Diagnosis Date  . CAD (coronary artery disease) October 2008    s/p inferolateral STEMI.... PCI with BMS to LCX. (cath with EF 50%, mod inf HK, RCA 40%, small oD3 70%, OM1 40%, CFX occluded - tx with PCI);  echo 10/08: EF 45%, mild RVE;  Myoview 2009. Inferior infarct. no ischemia, EF 45%  . Hypertension   . High cholesterol   . COPD (chronic obstructive pulmonary disease)     with ongoing tobacco  . Obesity   . Myocardial infarction 2008  . Asthma   . Shortness of breath     with exertion     Past Surgical History  Procedure Laterality Date  . Bare metal stent    . Arthrodesis      C4-C6  . Carpal tunnel release  unknown    Bilateral   . Cardiovascular stress test  04/03/2011  . Cardiac catheterization  2008    cardiologist: Dr. Sharlet SalinaBenjamin   . Anterior cervical decomp/discectomy fusion  before 2008  . Appendectomy  age 89  . Total knee arthroplasty  05/24/2011    Procedure: TOTAL KNEE ARTHROPLASTY;  Surgeon: Thera FlakeW D Caffrey Jr., MD;  Location: [facility] OR;   Service: Orthopedics;  Laterality: Right;  RIGHT ARTHROSCOPY TOTAL KNEE    Current Outpatient Prescriptions  Medication Sig Dispense Refill  . amLODipine (NORVASC) 5 MG tablet Take 1 tablet (5 mg total) by mouth daily. 90 tablet 3  . aspirin 81 MG tablet Take 1 tablet (81 mg total) by mouth daily.    [name]. atorvastatin (LIPITOR) 80 MG tablet Take 1 tablet (80 mg total) by mouth daily. 90 tablet 3  . carvedilol (COREG) 6.25 MG tablet Take 1 tablet (6.25 mg total) by mouth 2 (two) times daily with a meal. 180 tablet 3   No current facility-administered medications for this visit.    Allergies:   Review of patient's allergies indicates no known allergies.   Social History:  The patient  reports that he has been smoking.  He has never used smokeless tobacco. He reports that he does not drink alcohol or use illicit drugs.   Family History:  The patient's family history includes Heart attack (age of onset: 5592) in his father. There is no history of Coronary artery disease.    ROS:  Please see the history of present illness.  All other systems are reviewed and negative.    PHYSICAL EXAM: VS:  BP 138/78 mmHg  Pulse 57  Ht 5\' 9"  (1.753  m)  Wt 219 lb 1.9 oz (99.392 kg)  BMI 32.34 kg/m2 , BMI Body mass index is 32.34 kg/(m^2). GEN: Well nourished, well developed,pleasant overweight male in no acute distress HEENT: normal Neck: no JVD, no masses. No carotid bruits Cardiac: RRR without murmur or gallop                Respiratory:  Coarse breath sounds with scattered rhonchi bilaterally GI: soft, nontender, nondistended, + BS MS: no deformity or atrophy Ext: no pretibial edema, pedal pulses 2+= bilaterally Skin: warm and dry, no rash Neuro:  Strength and sensation are intact Psych: euthymic mood, full affect  EKG:  EKG is ordered today. The ekg ordered today shows Sinus bradycardia 57 bpm, LAFB, age-indeterminate anterolateral infarct. No change from previous tracings.  Recent Labs: No  results found for requested labs within last 365 days.   Lipid Panel     Component Value Date/Time   CHOL  08/14/2007 0340    98        ATP III CLASSIFICATION:  <200     mg/dL   Desirable  161-096  mg/dL   Borderline High  >=045    mg/dL   High   TRIG 60 40/98/1191 0340   HDL 36* 08/14/2007 0340   CHOLHDL 2.7 08/14/2007 0340   VLDL 12 08/14/2007 0340   LDLCALC  08/14/2007 0340    50        Total Cholesterol/HDL:CHD Risk Coronary Heart Disease Risk Table                     Men   Women  1/2 Average Risk   3.4   3.3      Wt Readings from Last 3 Encounters:  09/28/14 219 lb 1.9 oz (99.392 kg)  07/21/13 220 lb 1.9 oz (99.846 kg)  06/19/12 218 lb (98.884 kg)     ASSESSMENT AND PLAN: 1.  CAD, native vessel: the patient is stable without symptoms of angina. He was advised to reduce his aspirin 81 mg daily. Otherwise will continue on his current medical program without changes. We reviewed the need for lifestyle modification with a focus on increased exercise, tobacco cessation, and dietary changes to achieve weight loss.  2. Essential hypertension: blood pressure is well controlled on carvedilol and amlodipine  3. Hyperlipidemia: the patient takes atorvastatin 80 mg daily. Labs are followed regularly by his primary physician.  4. Tobacco abuse: he has decreased cigarette use. Counseling regarding cessation was done.   Current medicines are reviewed with the patient today.  The patient does not have concerns regarding medicines.  The following changes have been made:    Decrease ASA to 81 mg daily  Labs/ tests ordered today include:   Orders Placed This Encounter  Procedures  . EKG 12-Lead   Disposition:   FU one year  Signed, [name], MD  09/28/2014 10:55 PM    [facility] [address] [place], [place], [place]  [address] Phone: [phone]; Fax: [phone]

## 2014-09-28 NOTE — Patient Instructions (Addendum)
Medication Instructions:  Your physician has recommended you make the following change in your medication:  1. DECREASE Aspirin to 81mg  take one by mouth daily  Labwork: No new orders.  Testing/Procedures: No new orders.  Follow-Up: Your physician wants you to follow-up in: 1 YEAR with Dr Excell Seltzerooper.  You will receive a reminder letter in the mail two months in advance. If you don't receive a letter, please call our office to schedule the follow-up appointment.  Any Other Special Instructions Will Be Listed Below (If Applicable).

## 2014-11-16 ENCOUNTER — Telehealth: Payer: Self-pay | Admitting: Cardiovascular Disease

## 2014-11-16 NOTE — Telephone Encounter (Signed)
New message         Grant FanningJulie wants to know if you received a faxed from [employer] that was sent on 5/20

## 2014-11-16 NOTE — Telephone Encounter (Signed)
Dr Excell Seltzerooper has a folder of patient records and I will have to look through this folder when he is back in the office to see if we received paperwork on this pt.

## 2014-11-25 NOTE — Telephone Encounter (Signed)
Dr Excell Seltzer does not have a fax from this company. I called the number left in this message and it is not a working number.

## 2015-06-30 ENCOUNTER — Other Ambulatory Visit: Payer: Self-pay | Admitting: *Deleted

## 2015-06-30 DIAGNOSIS — I10 Essential (primary) hypertension: Secondary | ICD-10-CM

## 2015-06-30 DIAGNOSIS — E782 Mixed hyperlipidemia: Secondary | ICD-10-CM

## 2015-06-30 MED ORDER — CARVEDILOL 6.25 MG PO TABS: 6.2500 mg | ORAL_TABLET | Freq: Two times a day (BID) | ORAL | Status: DC

## 2015-06-30 MED ORDER — ATORVASTATIN CALCIUM 80 MG PO TABS: 80.0000 mg | ORAL_TABLET | Freq: Every day | ORAL | Status: DC

## 2015-06-30 MED ORDER — AMLODIPINE BESYLATE 5 MG PO TABS: 5.0000 mg | ORAL_TABLET | Freq: Every day | ORAL | Status: DC

## 2015-08-18 ENCOUNTER — Other Ambulatory Visit: Payer: Self-pay | Admitting: Cardiovascular Disease

## 2015-11-06 ENCOUNTER — Encounter: Payer: Self-pay | Admitting: Cardiovascular Disease

## 2015-11-06 ENCOUNTER — Ambulatory Visit (INDEPENDENT_AMBULATORY_CARE_PROVIDER_SITE_OTHER): Payer: Medicare Other | Admitting: Cardiovascular Disease

## 2015-11-06 VITALS — BP 140/70 | HR 60 | Ht 67.0 in | Wt 218.0 lb

## 2015-11-06 DIAGNOSIS — I252 Old myocardial infarction: Secondary | ICD-10-CM | POA: Diagnosis not present

## 2015-11-06 MED ORDER — ATORVASTATIN CALCIUM 80 MG PO TABS: ORAL_TABLET | ORAL | Status: DC

## 2015-11-06 MED ORDER — CARVEDILOL 6.25 MG PO TABS: ORAL_TABLET | ORAL | Status: DC

## 2015-11-06 MED ORDER — AMLODIPINE BESYLATE 5 MG PO TABS: ORAL_TABLET | ORAL | Status: DC

## 2015-11-06 NOTE — Progress Notes (Signed)
Cardiology Office Note Date:  11/06/2015   ID:  Grant KellyRobert L Gaiser, DOB 04/03/1941, MRN [id number]  PCP:  Ginette OttoSTONEKING,HAL THOMAS, MD  Cardiologist:  Tonny Bollmanooper, Chasitty Hehl, MD    Chief Complaint  Patient presents with  . Coronary Artery Disease    native vesel  . essential hypertension  . Hyperlipidemia     History of Present Illness: Grant Flores is a 75 y.o. male who presents for follow-up of coronary artery disease. He presented with an acute inferolateral MI in 2008 and was treated with stenting of the left circumflex (bare-metal stent). His only symptom at that time was throat pain. He has had no recurrence of his anginal symptoms. The patient was seen last one year ago and he reports no change in his symptoms since that evaluation. He has long-standing exertional dyspnea that he associates with his smoking, but reports no change. He denies orthopnea or PND. He has no leg swelling, chest pain, chest pressure, lightheadedness, or heart palpitations.   Past Medical History  Diagnosis Date  . CAD (coronary artery disease) October 2008    s/p inferolateral STEMI.... PCI with BMS to LCX. (cath with EF 50%, mod inf HK, RCA 40%, small oD3 70%, OM1 40%, CFX occluded - tx with PCI);  echo 10/08: EF 45%, mild RVE;  Myoview 2009. Inferior infarct. no ischemia, EF 45%  . Hypertension   . High cholesterol   . COPD (chronic obstructive pulmonary disease) (HCC)     with ongoing tobacco  . Obesity   . Myocardial infarction (HCC) 2008  . Asthma   . Shortness of breath     with exertion     Past Surgical History  Procedure Laterality Date  . Bare metal stent    . Arthrodesis      C4-C6  . Carpal tunnel release  unknown    Bilateral   . Cardiovascular stress test  04/03/2011  . Cardiac catheterization  2008    cardiologist: Dr. Sharlet SalinaBenjamin   . Anterior cervical decomp/discectomy fusion  before 2008  . Appendectomy  age 89  . Total knee arthroplasty  05/24/2011    Procedure: TOTAL KNEE ARTHROPLASTY;   Surgeon: Thera FlakeW D Caffrey Jr., MD;  Location: [facility] OR;  Service: Orthopedics;  Laterality: Right;  RIGHT ARTHROSCOPY TOTAL KNEE    Current Outpatient Prescriptions  Medication Sig Dispense Refill  . amLODipine (NORVASC) 5 MG tablet Take 1 tablet by mouth  daily 90 tablet 3  . aspirin 81 MG tablet Take 1 tablet (81 mg total) by mouth daily.    [name]. atorvastatin (LIPITOR) 80 MG tablet Take 1 tablet by mouth  daily 90 tablet 3  . carvedilol (COREG) 6.25 MG tablet Take 1 tablet by mouth two  times daily with meals 180 tablet 3   No current facility-administered medications for this visit.    Allergies:   Review of patient's allergies indicates no known allergies.   Social History:  The patient  reports that he has been smoking.  He has never used smokeless tobacco. He reports that he does not drink alcohol or use illicit drugs.   Family History:  The patient's family history includes Heart attack (age of onset: 1492) in his father. There is no history of Coronary artery disease.   ROS:  Please see the history of present illness.  Otherwise, review of systems is positive for shortness of breath and wheezing.  All other systems are reviewed and negative.   PHYSICAL EXAM: VS:  BP 140/70 mmHg  Pulse 60  Ht  (1.702 m)  Wt 218 lb (98.884 kg)  BMI 34.14 kg/m2 , BMI Body mass index is 34.14 kg/(m^2). GEN: Well nourished, well developed, obese, pleasant male in no acute distress HEENT: normal Neck: no JVD, no masses. No carotid bruits Cardiac: RRR without murmur or gallop                Respiratory:  clear to auscultation bilaterally, normal work of breathing GI: soft, nontender, nondistended, + BS MS: no deformity or atrophy Ext: no pretibial edema, pedal pulses 2+= bilaterally Skin: warm and dry, no rash Neuro:  Strength and sensation are intact Psych: euthymic mood, full affect  EKG:  EKG is ordered today. The ekg ordered today shows sinus rhythm, occasional PVC, LAFB, possible  age-indeterminate anterolateral infarct  Recent Labs: No results found for requested labs within last 365 days.   Lipid Panel     Component Value Date/Time   CHOL  08/14/2007 0340    98        ATP III CLASSIFICATION:  <200     mg/dL   Desirable  161-096  mg/dL   Borderline High  >=045    mg/dL   High   TRIG 60 40/98/1191 0340   HDL 36* 08/14/2007 0340   CHOLHDL 2.7 08/14/2007 0340   VLDL 12 08/14/2007 0340   LDLCALC  08/14/2007 0340    50        Total Cholesterol/HDL:CHD Risk Coronary Heart Disease Risk Table                     Men   Women  1/2 Average Risk   3.4   3.3      Wt Readings from Last 3 Encounters:  11/06/15 218 lb (98.884 kg)  09/28/14 219 lb 1.9 oz (99.392 kg)  07/21/13 220 lb 1.9 oz (99.846 kg)    ASSESSMENT AND PLAN: 1. CAD, native vessel: the patient is stable without symptoms of angina. He will continue on his current medical program without changes. We reviewed the need for lifestyle modification. EKG is compared with last year's tracing and there is no change.   2. Essential hypertension: blood pressure is controlled on carvedilol and amlodipine  3. Hyperlipidemia: the patient takes atorvastatin 80 mg daily. Labs are followed by Dr Pete Glatter.   4. Tobacco abuse: down to 1/2 ppd. States 'trying to quit' but has been smoking 49 years and doesn't think he'll be able to quit.   Current medicines are reviewed with the patient today.  The patient does not have concerns regarding medicines.  Labs/ tests ordered today include:  No orders of the defined types were placed in this encounter.   Disposition:   FU one year  Signed, [name], MD  11/06/2015 1:17 PM    [facility] [address] [place], [place], [place]  [address] Phone: [phone]; Fax: [phone]

## 2015-11-06 NOTE — Patient Instructions (Signed)

## 2015-11-08 NOTE — Addendum Note (Signed)
Addended by: Reesa ChewJONES, Merrie Epler G on: 11/08/2015 04:24 PM   Modules accepted: Orders

## 2015-11-09 ENCOUNTER — Ambulatory Visit: Payer: Medicare Other | Admitting: Cardiovascular Disease

## 2016-11-01 ENCOUNTER — Encounter: Payer: Self-pay | Admitting: *Deleted

## 2016-11-21 ENCOUNTER — Ambulatory Visit (INDEPENDENT_AMBULATORY_CARE_PROVIDER_SITE_OTHER): Payer: Medicare Other | Admitting: Cardiovascular Disease

## 2016-11-21 ENCOUNTER — Encounter: Payer: Self-pay | Admitting: Cardiovascular Disease

## 2016-11-21 VITALS — BP 138/70 | HR 58 | Ht 67.0 in | Wt 223.0 lb

## 2016-11-21 DIAGNOSIS — I2510 Atherosclerotic heart disease of native coronary artery without angina pectoris: Secondary | ICD-10-CM

## 2016-11-21 MED ORDER — CARVEDILOL 6.25 MG PO TABS: ORAL_TABLET | ORAL | 3 refills | Status: DC

## 2016-11-21 MED ORDER — ATORVASTATIN CALCIUM 80 MG PO TABS: ORAL_TABLET | ORAL | 3 refills | Status: DC

## 2016-11-21 MED ORDER — AMLODIPINE BESYLATE 5 MG PO TABS: ORAL_TABLET | ORAL | 3 refills | Status: DC

## 2016-11-21 NOTE — Patient Instructions (Signed)

## 2016-11-21 NOTE — Progress Notes (Signed)
Cardiology Office Note Date:  11/21/2016   ID:  Grant Flores, DOB 1940/08/07, MRN [id number]  PCP:  Merlene Laughter, MD  Cardiologist:  [name], MD    Chief Complaint  Patient presents with  . Coronary Artery Disease   History of Present Illness: Grant Flores is a 76 y.o. male who presents for  follow-up of coronary artery disease. He presented with an acute inferolateral MI in 2008 and was treated with stenting of the left circumflex (bare-metal stent). His only symptom at that time was throat pain. He has had no recurrence of his anginal symptoms.  The patient is here alone today. Today, he denies symptoms of palpitations, chest pain, orthopnea, PND, lower extremity edema, dizziness, or syncope. He has mild shortness of breath with activity. Feels like he has a cold. C/o dry cough. No other complaints.   Past Medical History:  Diagnosis Date  . Asthma   . CAD (coronary artery disease) October 2008   s/p inferolateral STEMI.... PCI with BMS to LCX. (cath with EF 50%, mod inf HK, RCA 40%, small oD3 70%, OM1 40%, CFX occluded - tx with PCI);  echo 10/08: EF 45%, mild RVE;  Myoview 2009. Inferior infarct. no ischemia, EF 45%  . COPD (chronic obstructive pulmonary disease) (HCC)    with ongoing tobacco  . High cholesterol   . Hypertension   . Myocardial infarction (HCC) 2008  . Obesity   . Shortness of breath    with exertion     Past Surgical History:  Procedure Laterality Date  . ANTERIOR CERVICAL DECOMP/DISCECTOMY FUSION  before 2008  . APPENDECTOMY  age 58  . ARTHRODESIS     C4-C6  . Bare Metal Stent    . CARDIAC CATHETERIZATION  2008   cardiologist: Dr. Sharlet Salina   . CARDIOVASCULAR STRESS TEST  04/03/2011  . CARPAL TUNNEL RELEASE  unknown   Bilateral   . TOTAL KNEE ARTHROPLASTY  05/24/2011   Procedure: TOTAL KNEE ARTHROPLASTY;  Surgeon: Thera Flake., MD;  Location: [facility] OR;  Service: Orthopedics;  Laterality: Right;  RIGHT ARTHROSCOPY TOTAL KNEE    Current  Outpatient Prescriptions  Medication Sig Dispense Refill  . amLODipine (NORVASC) 5 MG tablet Take 1 tablet by mouth  daily 90 tablet 3  . aspirin 81 MG tablet Take 1 tablet (81 mg total) by mouth daily.    [name] atorvastatin (LIPITOR) 80 MG tablet Take 1 tablet by mouth  daily 90 tablet 3  . carvedilol (COREG) 6.25 MG tablet Take 1 tablet by mouth two  times daily with meals 180 tablet 3   No current facility-administered medications for this visit.     Allergies:   Patient has no known allergies.   Social History:  The patient  reports that he has been smoking.  He has been smoking about 1.50 packs per day. He has never used smokeless tobacco. He reports that he does not drink alcohol or use drugs.   Family History:  The patient's  family history includes Heart attack (age of onset: 50) in his father.   ROS:  Please see the history of present illness.  Otherwise, review of systems is positive for wheezing, exertional dyspnea.  All other systems are reviewed and negative.   PHYSICAL EXAM: VS:  BP 138/70   Pulse (!) 58   Ht 5\' 7"  (1.702 m)   Wt 223 lb (101.2 kg)   BMI 34.93 kg/m  , BMI Body mass index is 34.93 kg/m.  GEN: Well nourished, well developed, in no acute distress  HEENT: normal  Neck: no JVD, no masses. No carotid bruits Cardiac: RRR without murmur or gallop                Respiratory:  Scattered rhonchi bilaterally, normal work of breathing GI: soft, nontender, nondistended, + BS MS: no deformity or atrophy  Ext: trace bilateral pretibial edema, pedal pulses 2+= bilaterally Skin: warm and dry, no rash Neuro:  Strength and sensation are intact Psych: euthymic mood, full affect  EKG:  EKG is ordered today. The ekg ordered today shows sinus bradycardia 58 bpm, LAFB, age-indeterminate anterolateral infarct  Recent Labs: No results found for requested labs within last 8760 hours.   Lipid Panel     Component Value Date/Time   CHOL  08/14/2007 0340    98        ATP III  CLASSIFICATION:  <200     mg/dL   Desirable  119-147200-239  mg/dL   Borderline High  >=829>=240    mg/dL   High   TRIG 60 56/21/308602/27/2009 0340   HDL 36 (L) 08/14/2007 0340   CHOLHDL 2.7 08/14/2007 0340   VLDL 12 08/14/2007 0340   LDLCALC  08/14/2007 0340    50        Total Cholesterol/HDL:CHD Risk Coronary Heart Disease Risk Table                     Men   Women  1/2 Average Risk   3.4   3.3      Wt Readings from Last 3 Encounters:  11/21/16 223 lb (101.2 kg)  11/06/15 218 lb (98.9 kg)  09/28/14 219 lb 1.9 oz (99.4 kg)    ASSESSMENT AND PLAN: 1.  CAD, native vessel, without angina: doing well - will continue same Rx. No changes recommended today.   2. Essential HTN: BP well-controlled. Continue amlodipine and carvedilol.  3. Hyperlipidemia: continue atorvastatin 80 mg. Labs followed by Dr Pete GlatterStoneking.   4. Tobacco abuse: still smoking at least 1/2 ppd - cessation counseling done.   Current medicines are reviewed with the patient today.  The patient does not have concerns regarding medicines.  Labs/ tests ordered today include:   Orders Placed This Encounter  Procedures  . EKG 12-Lead    Disposition:   FU one year  Signed, Tonny Bollmanooper, Rochelle Nephew, MD  11/21/2016 5:27 PM    [facility] [address], [place], [place]  [address] Phone: [phone]; Fax: [phone]

## 2016-11-22 ENCOUNTER — Ambulatory Visit: Payer: Medicare Other | Admitting: Cardiovascular Disease

## 2017-11-18 ENCOUNTER — Other Ambulatory Visit: Payer: Self-pay | Admitting: Geriatric Medicine

## 2017-11-18 DIAGNOSIS — F17210 Nicotine dependence, cigarettes, uncomplicated: Secondary | ICD-10-CM

## 2017-11-19 ENCOUNTER — Other Ambulatory Visit: Payer: Self-pay | Admitting: Cardiovascular Disease

## 2017-11-28 ENCOUNTER — Ambulatory Visit
Admission: RE | Admit: 2017-11-28 | Discharge: 2017-11-28 | Disposition: A | Discharge status: Home / Self Care | Payer: Medicare Other | Source: Ambulatory Visit | Attending: Geriatric Medicine | Admitting: Geriatric Medicine

## 2017-11-28 DIAGNOSIS — F17210 Nicotine dependence, cigarettes, uncomplicated: Secondary | ICD-10-CM

## 2017-12-24 ENCOUNTER — Telehealth: Payer: Self-pay | Admitting: Acute Care

## 2017-12-24 NOTE — Telephone Encounter (Signed)
Dr. Pete GlatterStoneking, Please note the results of the low dose CT you ordered on [name]. Based on this finding, Lung  RADS 3, nodules that are probably benign findings, short term follow up suggested: includes nodules with a low likelihood of becoming a clinically active cancer. Radiology recommends a 6 month repeat LDCT follow up. The patient should have a 6 month follow - up "CT CHEST LCS NODULE FOLLOW-UP W/O CM").   I get a list of all abnormal screening scans and while this patient is not followed by the Lung Cancer Screening Program, and is screened independently by you,  I wanted to make sure you were aware of these results, and the need for a 6 month follow up. Thanks so much.    12/03/2017 CT Chest Lung Cancer Screening Findings:  Cardiovascular: The heart is normal in size. No pericardial effusion.  No evidence of thoracic aortic aneurysm. Mild atherosclerotic calcifications of the aortic arch.  Three vessel cord atherosclerosis.  Mediastinum/Nodes: Small mediastinal lymph nodes which do not meet pathologic CT size criteria.  Visualized thyroid is unremarkable.  Lungs/Pleura: Mild centrilobular and paraseptal emphysematous changes, upper lobe predominant.  Mild compressive atelectasis in the medial right lower lobe.  Mild subpleural reticulation/fibrosis in the bilateral lower lobes.  No focal consolidation.  6.4 mm irregular/spiculated nodule in the left upper lobe.  No pleural effusion or pneumothorax.  Upper Abdomen: Visualized upper abdomen is notable for suspected tiny layering gallstones (series 2/image 65).  Musculoskeletal: Degenerative changes of the visualized thoracolumbar spine.  IMPRESSION: Lung-RADS 3, probably benign findings. Short-term follow-up in 6 months is recommended with repeat low-dose chest CT without contrast (please use the following order, "CT CHEST LCS NODULE FOLLOW-UP W/O CM").  Aortic Atherosclerosis (ICD10-I70.0)  and Emphysema (ICD10-J43.9).

## 2018-01-01 ENCOUNTER — Other Ambulatory Visit: Payer: Self-pay

## 2018-01-01 MED ORDER — CARVEDILOL 6.25 MG PO TABS: 6.2500 mg | ORAL_TABLET | Freq: Two times a day (BID) | ORAL | 3 refills | Status: DC

## 2018-01-01 MED ORDER — ATORVASTATIN CALCIUM 80 MG PO TABS: 80.0000 mg | ORAL_TABLET | Freq: Every day | ORAL | 3 refills | Status: DC

## 2018-01-01 MED ORDER — AMLODIPINE BESYLATE 5 MG PO TABS: 5.0000 mg | ORAL_TABLET | Freq: Every day | ORAL | 3 refills | Status: DC

## 2018-01-12 ENCOUNTER — Encounter: Payer: Self-pay | Admitting: Cardiovascular Disease

## 2018-01-12 ENCOUNTER — Ambulatory Visit (INDEPENDENT_AMBULATORY_CARE_PROVIDER_SITE_OTHER): Payer: Medicare Other | Admitting: Cardiovascular Disease

## 2018-01-12 VITALS — BP 124/68 | HR 59 | Ht 67.0 in | Wt 224.1 lb

## 2018-01-12 DIAGNOSIS — I2510 Atherosclerotic heart disease of native coronary artery without angina pectoris: Secondary | ICD-10-CM

## 2018-01-12 DIAGNOSIS — R0602 Shortness of breath: Secondary | ICD-10-CM | POA: Diagnosis not present

## 2018-01-12 NOTE — Progress Notes (Signed)
Cardiology Office Note Date:  01/12/2018   ID:  Grant Flores, DOB 07/03/1940, MRN [id number]  PCP:  Merlene LaughterStoneking, Hal, MD  Cardiologist:  Tonny BollmanMichael Zunairah Devers, MD    Chief Complaint  Patient presents with  . Shortness of Breath     History of Present Illness: Grant Flores is a 77 y.o. male who presents for follow-up of CAD.  The patient initially presented in 2008 with an acute inferolateral MI treated with stenting of the left flex patient a bare-metal stent platform.  He presented with severe pain in his throat without any other symptoms.  He has had no recurrence of similar symptoms.  The patient is here with his wife today.  He complains of shortness of breath with low-level activity.  He is also limited by right knee pain.  He denies orthopnea, PND, chest pain, throat pain, or arm pain.  He continues to smoke heavily.   Past Medical History:  Diagnosis Date  . Asthma   . CAD (coronary artery disease) October 2008   s/p inferolateral STEMI.... PCI with BMS to LCX. (cath with EF 50%, mod inf HK, RCA 40%, small oD3 70%, OM1 40%, CFX occluded - tx with PCI);  echo 10/08: EF 45%, mild RVE;  Myoview 2009. Inferior infarct. no ischemia, EF 45%  . COPD (chronic obstructive pulmonary disease) (HCC)    with ongoing tobacco  . High cholesterol   . Hypertension   . Myocardial infarction (HCC) 2008  . Obesity   . Shortness of breath    with exertion     Past Surgical History:  Procedure Laterality Date  . ANTERIOR CERVICAL DECOMP/DISCECTOMY FUSION  before 2008  . APPENDECTOMY  age 89  . ARTHRODESIS     C4-C6  . Bare Metal Stent    . CARDIAC CATHETERIZATION  2008   cardiologist: Dr. Sharlet SalinaBenjamin   . CARDIOVASCULAR STRESS TEST  04/03/2011  . CARPAL TUNNEL RELEASE  unknown   Bilateral   . TOTAL KNEE ARTHROPLASTY  05/24/2011   Procedure: TOTAL KNEE ARTHROPLASTY;  Surgeon: Thera FlakeW D Caffrey Jr., MD;  Location: [facility] OR;  Service: Orthopedics;  Laterality: Right;  RIGHT ARTHROSCOPY TOTAL KNEE     Current Outpatient Medications  Medication Sig Dispense Refill  . amLODipine (NORVASC) 5 MG tablet Take 1 tablet (5 mg total) by mouth daily. 90 tablet 3  . aspirin 81 MG tablet Take 1 tablet (81 mg total) by mouth daily.    [name]. atorvastatin (LIPITOR) 80 MG tablet Take 1 tablet (80 mg total) by mouth daily. 90 tablet 3  . carvedilol (COREG) 6.25 MG tablet Take 1 tablet (6.25 mg total) by mouth 2 (two) times daily with a meal. 180 tablet 3  . ipratropium-albuterol (DUONEB) 0.5-2.5 (3) MG/3ML SOLN USE 1 VIAL IN NEBULIZER EVERY 6 HOURS  5  . tamsulosin (FLOMAX) 0.4 MG CAPS capsule Take 0.4 mg by mouth daily.  6   No current facility-administered medications for this visit.     Allergies:   Patient has no known allergies.   Social History:  The patient  reports that he has been smoking.  He has been smoking about 1.50 packs per day. He has never used smokeless tobacco. He reports that he does not drink alcohol or use drugs.   Family History:  The patient's family history includes Heart attack (age of onset: 7592) in his father.    ROS:  Please see the history of present illness.  Otherwise, review of systems is positive for  knee pain.  All other systems are reviewed and negative.    PHYSICAL EXAM: VS:  BP 124/68   Pulse (!) 59   Ht 5\' 7"  (1.702 m)   Wt 224 lb 1.9 oz (101.7 kg)   SpO2 98%   BMI 35.10 kg/m  , BMI Body mass index is 35.1 kg/m. GEN: obese, elderly male, in no acute distress  HEENT: normal  Neck: no JVD, no masses. No carotid bruits Cardiac: RRR without murmur or gallop     Respiratory:  Diffuse rhonchi bilaterally, normal work of breathing GI: soft, nontender, nondistended, + BS MS: no deformity or atrophy  Ext: 1+ bilateral pretibial edema, no rash Neuro:  Strength and sensation are intact Psych: euthymic mood, full affect  EKG:  EKG is ordered today. The ekg ordered today shows normal sinus rhythm 59 bpm, first-degree AV block, left anterior fascicular  block.  Recent Labs: No results found for requested labs within last 8760 hours.   Lipid Panel     Component Value Date/Time   CHOL  08/14/2007 0340    98        ATP III CLASSIFICATION:  <200     mg/dL   Desirable  960-454  mg/dL   Borderline High  >=098    mg/dL   High   TRIG 60 11/91/4782 0340   HDL 36 (L) 08/14/2007 0340   CHOLHDL 2.7 08/14/2007 0340   VLDL 12 08/14/2007 0340   LDLCALC  08/14/2007 0340    50        Total Cholesterol/HDL:CHD Risk Coronary Heart Disease Risk Table                     Men   Women  1/2 Average Risk   3.4   3.3      Wt Readings from Last 3 Encounters:  01/12/18 224 lb 1.9 oz (101.7 kg)  11/21/16 223 lb (101.2 kg)  11/06/15 218 lb (98.9 kg)    ASSESSMENT AND PLAN: 1.  Coronary artery disease, native vessel, without angina: Patient appears stable aspirin statin drug.  Also tolerating a beta-blocker.  2.  Hypertension: Blood pressure is well controlled on amlodipine.  3.  Hyperlipidemia: Treated with atorvastatin.  Most recent labs reviewed and demonstrated a cholesterol of 97, LDL 41, LDL 37.  On his same program.  4.  Tobacco abuse: Major ongoing he has dyspnea, an diffuse rhonchi on lung exam and cough.  He has undergone recent CT screening study with recommendation for six-month follow-up of a small spiculated nodule.  Cessation counseling is done.  He does not appear ready to quit.  5.  Shortness of breath: Likely secondary to #4.  However, no recent assessment of LV/RV function.  We will update an echocardiogram.  Current medicines are reviewed with the patient today.  The patient does not have concerns regarding medicines.  Labs/ tests ordered today include:  No orders of the defined types were placed in this encounter.  Disposition:   FU one year  Signed, [name], MD  01/12/2018 9:42 AM    [facility] [address] [place], [place], [place]  [address] Phone: [phone]; Fax: [phone]

## 2018-01-12 NOTE — Patient Instructions (Addendum)
Medication Instructions:  Your physician recommends that you continue on your current medications as directed. Please refer to the Current Medication list given to you today.  Labwork: None  Testing/Procedures: Your physician has requested that you have an echocardiogram. Echocardiography is a painless test that uses sound waves to create images of your heart. It provides your doctor with information about the size and shape of your heart and how well your heart's chambers and valves are working. This procedure takes approximately one hour. There are no restrictions for this procedure. You are scheduled for your echo on Augsut 6, 2019. Please arrive at 1:30PM.   Follow-Up: Your physician wants you to follow-up in: 1 year with Dr. Excell Seltzerooper. You will receive a reminder letter in the mail two months in advance. If you don't receive a letter, please call our office to schedule the follow-up appointment.  If you need a refill on your cardiac medications before your next appointment, please call your pharmacy.

## 2018-01-14 ENCOUNTER — Ambulatory Visit (HOSPITAL_COMMUNITY): Payer: Medicare Other | Attending: Cardiovascular Disease

## 2018-01-14 ENCOUNTER — Other Ambulatory Visit: Payer: Self-pay

## 2018-01-14 DIAGNOSIS — J449 Chronic obstructive pulmonary disease, unspecified: Secondary | ICD-10-CM | POA: Diagnosis not present

## 2018-01-14 DIAGNOSIS — Z6835 Body mass index (BMI) 35.0-35.9, adult: Secondary | ICD-10-CM | POA: Diagnosis not present

## 2018-01-14 DIAGNOSIS — E785 Hyperlipidemia, unspecified: Secondary | ICD-10-CM | POA: Diagnosis not present

## 2018-01-14 DIAGNOSIS — I2510 Atherosclerotic heart disease of native coronary artery without angina pectoris: Secondary | ICD-10-CM | POA: Insufficient documentation

## 2018-01-14 DIAGNOSIS — E669 Obesity, unspecified: Secondary | ICD-10-CM | POA: Diagnosis not present

## 2018-01-14 DIAGNOSIS — R0602 Shortness of breath: Secondary | ICD-10-CM | POA: Diagnosis present

## 2018-01-14 DIAGNOSIS — I10 Essential (primary) hypertension: Secondary | ICD-10-CM | POA: Insufficient documentation

## 2018-01-14 DIAGNOSIS — I252 Old myocardial infarction: Secondary | ICD-10-CM | POA: Diagnosis not present

## 2018-01-20 ENCOUNTER — Other Ambulatory Visit (HOSPITAL_COMMUNITY): Payer: Medicare Other

## 2018-06-04 ENCOUNTER — Other Ambulatory Visit: Payer: Self-pay | Admitting: Geriatric Medicine

## 2018-06-04 DIAGNOSIS — R911 Solitary pulmonary nodule: Secondary | ICD-10-CM

## 2018-06-12 ENCOUNTER — Ambulatory Visit
Admission: RE | Admit: 2018-06-12 | Discharge: 2018-06-12 | Disposition: A | Discharge status: Home / Self Care | Payer: Medicare Other | Source: Ambulatory Visit | Attending: Geriatric Medicine | Admitting: Geriatric Medicine

## 2018-06-12 DIAGNOSIS — R911 Solitary pulmonary nodule: Secondary | ICD-10-CM

## 2018-12-01 ENCOUNTER — Other Ambulatory Visit: Payer: Self-pay | Admitting: Cardiovascular Disease

## 2018-12-02 ENCOUNTER — Other Ambulatory Visit: Payer: Self-pay | Admitting: Geriatric Medicine

## 2018-12-02 DIAGNOSIS — F17200 Nicotine dependence, unspecified, uncomplicated: Secondary | ICD-10-CM

## 2018-12-11 ENCOUNTER — Other Ambulatory Visit: Payer: Self-pay | Admitting: Cardiovascular Disease

## 2018-12-11 NOTE — Telephone Encounter (Signed)
New Message   Patient calling in because he needs his medication refilled. The following prescriptions need to be refilled: amLODipine (NORVASC) 5 MG tablet atorvastatin (LIPITOR) 80 MG tablet carvedilol (COREG) 6.25 MG tablet  Pharmacy used: [facility] [place] not have access to .dot phrases

## 2018-12-14 MED ORDER — CARVEDILOL 6.25 MG PO TABS: ORAL_TABLET | ORAL | 0 refills | Status: DC

## 2018-12-14 MED ORDER — AMLODIPINE BESYLATE 5 MG PO TABS: 5.0000 mg | ORAL_TABLET | Freq: Every day | ORAL | 0 refills | Status: DC

## 2018-12-14 MED ORDER — ATORVASTATIN CALCIUM 80 MG PO TABS: 80.0000 mg | ORAL_TABLET | Freq: Every day | ORAL | 0 refills | Status: DC

## 2018-12-14 NOTE — Telephone Encounter (Signed)
Pt's medications was sent to pt's pharmacy as requested. Confirmation received.  

## 2018-12-21 ENCOUNTER — Other Ambulatory Visit: Payer: Self-pay

## 2018-12-21 ENCOUNTER — Ambulatory Visit
Admission: RE | Admit: 2018-12-21 | Discharge: 2018-12-21 | Disposition: A | Discharge status: Home / Self Care | Payer: Medicare Other | Source: Ambulatory Visit | Attending: Geriatric Medicine | Admitting: Geriatric Medicine

## 2018-12-21 DIAGNOSIS — F17200 Nicotine dependence, unspecified, uncomplicated: Secondary | ICD-10-CM

## 2019-02-11 ENCOUNTER — Other Ambulatory Visit: Payer: Self-pay | Admitting: Cardiovascular Disease

## 2019-03-22 ENCOUNTER — Ambulatory Visit: Payer: Medicare Other | Admitting: Cardiovascular Disease

## 2019-03-22 ENCOUNTER — Other Ambulatory Visit: Payer: Self-pay

## 2019-03-22 ENCOUNTER — Encounter: Payer: Self-pay | Admitting: Cardiovascular Disease

## 2019-03-22 ENCOUNTER — Encounter (INDEPENDENT_AMBULATORY_CARE_PROVIDER_SITE_OTHER): Payer: Self-pay

## 2019-03-22 VITALS — BP 102/68 | HR 61 | Ht 68.0 in | Wt 221.0 lb

## 2019-03-22 DIAGNOSIS — I2510 Atherosclerotic heart disease of native coronary artery without angina pectoris: Secondary | ICD-10-CM

## 2019-03-22 DIAGNOSIS — R0602 Shortness of breath: Secondary | ICD-10-CM | POA: Diagnosis not present

## 2019-03-22 DIAGNOSIS — E782 Mixed hyperlipidemia: Secondary | ICD-10-CM | POA: Diagnosis not present

## 2019-03-22 DIAGNOSIS — I10 Essential (primary) hypertension: Secondary | ICD-10-CM

## 2019-03-22 DIAGNOSIS — I483 Typical atrial flutter: Secondary | ICD-10-CM

## 2019-03-22 MED ORDER — AMLODIPINE BESYLATE 5 MG PO TABS: 5.0000 mg | ORAL_TABLET | Freq: Every day | ORAL | 3 refills | Status: DC

## 2019-03-22 MED ORDER — APIXABAN 5 MG PO TABS: 5.0000 mg | ORAL_TABLET | Freq: Two times a day (BID) | ORAL | 3 refills | Status: DC

## 2019-03-22 MED ORDER — CARVEDILOL 6.25 MG PO TABS: ORAL_TABLET | ORAL | 3 refills | Status: DC

## 2019-03-22 MED ORDER — ATORVASTATIN CALCIUM 80 MG PO TABS: 80.0000 mg | ORAL_TABLET | Freq: Every day | ORAL | 3 refills | Status: DC

## 2019-03-22 NOTE — Progress Notes (Signed)
Cardiology Office Note:    Date:  03/22/2019   ID:  Grant Flores, DOB 1940-06-28, MRN [id number]  PCP:  Merlene Laughter, MD  Cardiologist:  [name], MD  Electrophysiologist:  None   Referring MD: Merlene Laughter, MD   Chief Complaint  Patient presents with  . Coronary Artery Disease    History of Present Illness:    Grant Flores is a 78 y.o. male with a hx of coronary artery disease, presenting for follow-up evaluation.  The patient initially presented in 2008 with an acute inferolateral MI treated with stenting of the left circumflex using a bare-metal stent.  His presenting symptom was pain in his throat with no associated symptoms.  He has had no recurrence of this.  The patient has been a heavy smoker and he has chronic shortness of breath with low-level activity.  Since his last visit, he has had a follow-up CT scan of the chest to evaluate a small pulmonary nodule.  This is unchanged at 6 mm and located in the left upper lobe, considered to have benign characteristics.  He continues to smoke.  The patient is here alone today.  He denies chest pain or pressure.  He denies throat pain which is been his previous anginal equivalent.  He admits to mild lower extremity swelling at times.  No orthopnea or PND.  No shortness of breath at rest.  Past Medical History:  Diagnosis Date  . Asthma   . CAD (coronary artery disease) October 2008   s/p inferolateral STEMI.... PCI with BMS to LCX. (cath with EF 50%, mod inf HK, RCA 40%, small oD3 70%, OM1 40%, CFX occluded - tx with PCI);  echo 10/08: EF 45%, mild RVE;  Myoview 2009. Inferior infarct. no ischemia, EF 45%  . COPD (chronic obstructive pulmonary disease) (HCC)    with ongoing tobacco  . High cholesterol   . Hypertension   . Myocardial infarction (HCC) 2008  . Obesity   . Shortness of breath    with exertion     Past Surgical History:  Procedure Laterality Date  . ANTERIOR CERVICAL DECOMP/DISCECTOMY FUSION  before 2008   . APPENDECTOMY  age 27  . ARTHRODESIS     C4-C6  . Bare Metal Stent    . CARDIAC CATHETERIZATION  2008   cardiologist: Dr. Sharlet Salina   . CARDIOVASCULAR STRESS TEST  04/03/2011  . CARPAL TUNNEL RELEASE  unknown   Bilateral   . TOTAL KNEE ARTHROPLASTY  05/24/2011   Procedure: TOTAL KNEE ARTHROPLASTY;  Surgeon: Thera Flake., MD;  Location: [facility] OR;  Service: Orthopedics;  Laterality: Right;  RIGHT ARTHROSCOPY TOTAL KNEE    Current Medications: Current Meds  Medication Sig  . amLODipine (NORVASC) 5 MG tablet Take 1 tablet (5 mg total) by mouth daily.  [name] atorvastatin (LIPITOR) 80 MG tablet Take 1 tablet (80 mg total) by mouth daily.  . carvedilol (COREG) 6.25 MG tablet TAKE 1 TABLET BY MOUTH  TWICE DAILY WITH MEALS  . ipratropium-albuterol (DUONEB) 0.5-2.5 (3) MG/3ML SOLN USE 1 VIAL IN NEBULIZER EVERY 6 HOURS  . tamsulosin (FLOMAX) 0.4 MG CAPS capsule Take 0.4 mg by mouth daily.  . [DISCONTINUED] amLODipine (NORVASC) 5 MG tablet TAKE 1 TABLET BY MOUTH  DAILY  . [DISCONTINUED] aspirin 81 MG tablet Take 1 tablet (81 mg total) by mouth daily.  . [DISCONTINUED] atorvastatin (LIPITOR) 80 MG tablet TAKE 1 TABLET BY MOUTH  DAILY  . [DISCONTINUED] carvedilol (COREG) 6.25 MG tablet TAKE 1  TABLET BY MOUTH  TWICE DAILY WITH MEALS     Allergies:   Patient has no known allergies.   Social History   Socioeconomic History  . Marital status: Married    Spouse name: Not on file  . Number of children: Not on file  . Years of education: Not on file  . Highest education level: Not on file  Occupational History  . Not on file  Social Needs  . Financial resource strain: Not on file  . Food insecurity    Worry: Not on file    Inability: Not on file  . Transportation needs    Medical: Not on file    Non-medical: Not on file  Tobacco Use  . Smoking status: Current Every Day Smoker    Packs/day: 1.50  . Smokeless tobacco: Never Used  Substance and Sexual Activity  . Alcohol use: No  . Drug use:  No  . Sexual activity: Not on file  Lifestyle  . Physical activity    Days per week: Not on file    Minutes per session: Not on file  . Stress: Not on file  Relationships  . Social Musicianconnections    Talks on phone: Not on file    Gets together: Not on file    Attends religious service: Not on file    Active member of club or organization: Not on file    Attends meetings of clubs or organizations: Not on file    Relationship status: Not on file  Other Topics Concern  . Not on file  Social History Narrative  . Not on file     Family History: The patient's family history includes Heart attack (age of onset: 5192) in his father. There is no history of Coronary artery disease.  ROS:   Please see the history of present illness.    All other systems reviewed and are negative.  EKGs/Labs/Other Studies Reviewed:    The following studies were reviewed today: Echo 01/14/2018: Study Conclusions  - Left ventricle: The cavity size was normal. Wall thickness was   increased in a pattern of severe LVH. There was focal basal   hypertrophy. Systolic function was normal. The estimated ejection   fraction was in the range of 55% to 60%. Wall motion was normal;   there were no regional wall motion abnormalities. Doppler   parameters are consistent with abnormal left ventricular   relaxation (grade 1 diastolic dysfunction). - Right atrium: The atrium was moderately dilated.  EKG:  EKG is ordered today.  The ekg ordered today demonstrates atrial flutter with 4-1 AV conduction, heart rate 61 bpm.  Typical atrial flutter pattern noted.  Recent Labs: No results found for requested labs within last 8760 hours.  Recent Lipid Panel    Component Value Date/Time   CHOL  08/14/2007 0340    98        ATP III CLASSIFICATION:  <200     mg/dL   Desirable  [id number]  mg/dL   Borderline High  >=956>=240    mg/dL   High   TRIG 60 21/30/865702/27/2009 0340   HDL 36 (L) 08/14/2007 0340   CHOLHDL 2.7 08/14/2007 0340    VLDL 12 08/14/2007 0340   LDLCALC  08/14/2007 0340    50        Total Cholesterol/HDL:CHD Risk Coronary Heart Disease Risk Table                     Men  Women  1/2 Average Risk   3.4   3.3    Physical Exam:    VS:  BP 102/68   Pulse 61   Ht 5\' 8"  (1.727 m)   Wt 221 lb (100.2 kg)   SpO2 98%   BMI 33.60 kg/m     Wt Readings from Last 3 Encounters:  03/22/19 221 lb (100.2 kg)  01/12/18 224 lb 1.9 oz (101.7 kg)  11/21/16 223 lb (101.2 kg)     GEN: elderly obese male in no acute distress HEENT: Normal NECK: No JVD; No carotid bruits LYMPHATICS: No lymphadenopathy CARDIAC: RRR, no murmurs, rubs, gallops RESPIRATORY: DIFFUSE BILATERAL RHONCHI ARE PRESENT  ABDOMEN: Soft, non-tender, non-distended MUSCULOSKELETAL: Trace bilateral pretibial edema; No deformity  SKIN: Warm and dry NEUROLOGIC:  Alert and oriented x 3 PSYCHIATRIC:  Normal affect   ASSESSMENT:    1. Coronary artery disease involving native coronary artery of native heart without angina pectoris   2. SOB (shortness of breath)   3. Mixed hyperlipidemia   4. Essential hypertension   5. Typical atrial flutter (HCC)    PLAN:    In order of problems listed above:  1. Stable without symptoms of angina.  His anginal equivalent is throat pain.  He has not had any symptoms since his evaluation last year.  Aspirin will be discontinued as were starting oral anticoagulation (see below). 2. Multifactorial, obesity and smoking are primary issues. 3. Treated with a high intensity statin drug.  LDL cholesterol is 37 mg/dL. 4. Blood pressure is well controlled on amlodipine and carvedilol. 5. New diagnosis, uncertain duration.  Echocardiogram is reviewed from last year.  Labs are reviewed from June demonstrating a normal hemoglobin of 15.7 and normal creatinine of 0.89.  His chads 2 vascular score is 4.  Anticoagulation is indicated.  He will stop aspirin and start apixaban 5 mg twice daily.  He has no history of bleeding,  blood transfusion, or gastrointestinal problems.  I do not think he is a good candidate for aggressive therapy such as ablation and he seems to be tolerating atrial flutter without problems with a heart rate of 60 bpm.  Will manage medically.   Medication Adjustments/Labs and Tests Ordered: Current medicines are reviewed at length with the patient today.  Concerns regarding medicines are outlined above.  Orders Placed This Encounter  Procedures  . EKG 12-Lead   Meds ordered this encounter  Medications  . amLODipine (NORVASC) 5 MG tablet    Sig: Take 1 tablet (5 mg total) by mouth daily.    Dispense:  90 tablet    Refill:  3    Requesting 1 year supply  . atorvastatin (LIPITOR) 80 MG tablet    Sig: Take 1 tablet (80 mg total) by mouth daily.    Dispense:  90 tablet    Refill:  3    Requesting 1 year supply  . carvedilol (COREG) 6.25 MG tablet    Sig: TAKE 1 TABLET BY MOUTH  TWICE DAILY WITH MEALS    Dispense:  180 tablet    Refill:  3    Requesting 1 year supply  . apixaban (ELIQUIS) 5 MG TABS tablet    Sig: Take 1 tablet (5 mg total) by mouth 2 (two) times daily.    Dispense:  180 tablet    Refill:  3    Patient Instructions  Medication Instructions:  1) STOP ASPIRIN 2) START ELIQUIS 5 mg twice daily  Labwork: None  Testing/Procedures: None  Follow-Up: Your provider recommends that you schedule a follow-up appointment in 6 months with Dr. Earmon Phoenix assistant, Tereso Newcomer.    Signed, [name], MD  03/22/2019 9:30 AM    [facility]

## 2019-03-22 NOTE — Patient Instructions (Addendum)
Medication Instructions:  1) STOP ASPIRIN 2) START ELIQUIS 5 mg twice daily  Labwork: None  Testing/Procedures: None  Follow-Up: Your provider recommends that you schedule a follow-up appointment in 6 months with Dr. Antionette Char assistant, Richardson Dopp.

## 2019-04-09 ENCOUNTER — Encounter (INDEPENDENT_AMBULATORY_CARE_PROVIDER_SITE_OTHER): Payer: Self-pay

## 2019-06-17 ENCOUNTER — Encounter

## 2019-07-06 ENCOUNTER — Ambulatory Visit: Payer: Medicare Other | Attending: Internal Medicine

## 2019-07-06 DIAGNOSIS — Z23 Encounter for immunization: Secondary | ICD-10-CM | POA: Insufficient documentation

## 2019-07-06 NOTE — Progress Notes (Signed)
   Covid-19 Vaccination Clinic  Name:  Grant Flores    MRN: [id number] DOB: January 21, 1941  07/06/2019  [name] was observed post Covid-19 immunization for 15 minutes without incidence. He was provided with Vaccine Information Sheet and instruction to access the V-Safe system.   [name] was instructed to call 911 with any severe reactions post vaccine: [name] Difficulty breathing  . Swelling of your face and throat  . A fast heartbeat  . A bad rash all over your body  . Dizziness and weakness    Immunizations Administered    Name Date Dose VIS Date Route   Pfizer COVID-19 Vaccine 07/06/2019  2:34 PM 0.3 mL 05/28/2019 Intramuscular   Manufacturer: [employer], [employer]   Lot: [id number]   NDC: [address]-9

## 2019-07-27 ENCOUNTER — Ambulatory Visit: Payer: Medicare Other | Attending: Internal Medicine

## 2019-07-27 ENCOUNTER — Ambulatory Visit: Payer: Medicare Other

## 2019-07-27 DIAGNOSIS — Z23 Encounter for immunization: Secondary | ICD-10-CM

## 2019-07-27 NOTE — Progress Notes (Signed)
   Covid-19 Vaccination Clinic  Name:  Grant Flores    MRN: [id number] DOB: July 08, 1940  07/27/2019  [name] was observed post Covid-19 immunization for 15 minutes without incidence. He was provided with Vaccine Information Sheet and instruction to access the V-Safe system.   [name] was instructed to call 911 with any severe reactions post vaccine: [name] Difficulty breathing  . Swelling of your face and throat  . A fast heartbeat  . A bad rash all over your body  . Dizziness and weakness    Immunizations Administered    Name Date Dose VIS Date Route   Pfizer COVID-19 Vaccine 07/27/2019 10:32 AM 0.3 mL 05/28/2019 Intramuscular   Manufacturer: [employer], [employer]   Lot: [id number]   NDC: [address]-4

## 2019-09-20 ENCOUNTER — Ambulatory Visit: Payer: Medicare Other | Admitting: Physician Assistant

## 2019-09-20 NOTE — Progress Notes (Addendum)
Cardiology Office Note:    Date:  09/21/2019   ID:  Grant Flores, DOB 10-Mar-1941, MRN [id number]  PCP:  Grant Laughter, MD  Cardiologist:  Grant Bollman, MD   Electrophysiologist:  None   Referring MD: Grant Laughter, MD   Chief Complaint:  Follow-up (CAD, atrial flutter)    Patient Profile:    Grant Flores is a 79 y.o. male with:   Coronary artery disease   S/p inf-lat MI in 2008 >> PCI: BMS to LCx  Atrial flutter  CHA2DS2-VASc=4 (age x 2, CAD, HTN) >> Apixaban   COPD  Pulmonary nodule  Tobacco use   Hypertension   Hyperlipidemia   Prior CV studies: Echocardiogram 01/14/18 Severe LVH, EF 55-60, no RWMA, Gr 1 DD, mod RAE  Myoview 04/15/11 EF 42, inf-lat scar, no ischemia  Cardiac catheterization 05/18/07 EF 50 RCA 40 LAD irregs; D2 and D3 ost 70 LCx 100, OM1 40 PCI:  BMS to LCx  History of Present Illness:    Grant Flores was last seen by Dr. Excell Seltzer in 03/2019.  He was noted to be AFlutter.  He was started on Apixaban at that time.  He returns for 6 month follow up.  He returns for follow-up.  He is here alone.  He has chronic shortness of breath with exertion.  This is unchanged.  He has not had chest discomfort, syncope, orthopnea.  He does have lower extremity swelling with prolonged standing that improves with elevation.  He has not had any bleeding issues.  Past Medical History:  Diagnosis Date  . Asthma   . CAD (coronary artery disease) October 2008   s/p inferolateral STEMI.... PCI with BMS to LCX. (cath with EF 50%, mod inf HK, RCA 40%, small oD3 70%, OM1 40%, CFX occluded - tx with PCI);  echo 10/08: EF 45%, mild RVE;  Myoview 2009. Inferior infarct. no ischemia, EF 45%  . COPD (chronic obstructive pulmonary disease) (HCC)    with ongoing tobacco  . High cholesterol   . Hypertension   . Myocardial infarction (HCC) 2008  . Obesity   . Shortness of breath    with exertion     Current Medications: Current Meds  Medication Sig  . amLODipine  (NORVASC) 5 MG tablet Take 1 tablet (5 mg total) by mouth daily.  [name] apixaban (ELIQUIS) 5 MG TABS tablet Take 1 tablet (5 mg total) by mouth 2 (two) times daily.  [name] atorvastatin (LIPITOR) 80 MG tablet Take 1 tablet (80 mg total) by mouth daily.  . carvedilol (COREG) 6.25 MG tablet TAKE 1 TABLET BY MOUTH  TWICE DAILY WITH MEALS  . ipratropium-albuterol (DUONEB) 0.5-2.5 (3) MG/3ML SOLN USE 1 VIAL IN NEBULIZER EVERY 6 HOURS  . tamsulosin (FLOMAX) 0.4 MG CAPS capsule Take 0.4 mg by mouth daily.     Allergies:   Patient has no known allergies.   Social History   Tobacco Use  . Smoking status: Current Every Day Smoker    Packs/day: 1.50  . Smokeless tobacco: Never Used  Substance Use Topics  . Alcohol use: No  . Drug use: No     Family Hx: The patient's family history includes Heart attack (age of onset: 60) in his father. There is no history of Coronary artery disease.  ROS   EKGs/Labs/Other Test Reviewed:    EKG:  EKG is   ordered today.  The ekg ordered today demonstrates atrial flutter, typical counterclockwise,3:1 conduction, PVC, no change since last EKG dated 03/22/2019  Recent  Labs: No results found for requested labs within last 8760 hours.   Labs from primary care ([facility]) personally reviewed and interpreted 11/27/2018: TC 90, TG 91, HDL 35, LDL 37, Hgb 15.7, K 4.1, creatinine 0.89, ALT 14  Recent Lipid Panel   Physical Exam:    VS:  BP 112/70   Pulse 62   Ht 5\' 8"  (1.727 m)   Wt 218 lb 12.8 oz (99.2 kg)   SpO2 94%   BMI 33.27 kg/m     Wt Readings from Last 3 Encounters:  09/21/19 218 lb 12.8 oz (99.2 kg)  03/22/19 221 lb (100.2 kg)  01/12/18 224 lb 1.9 oz (101.7 kg)     Constitutional:      Appearance: Healthy appearance. Not in distress.  Neck:     Vascular: JVD normal.  Pulmonary:     Breath sounds: No wheezing. No rales.     Comments: Decreased breath sounds; upper airway noises cleared by cough Cardiovascular:     Normal rate. Irregular rhythm.  Normal S1. Normal S2.     Murmurs: There is no murmur.  Edema:    Ankle: bilateral trace edema of the ankle. Abdominal:     Palpations: Abdomen is soft. There is no hepatomegaly.  Skin:    General: Skin is warm and dry.  Neurological:     General: No focal deficit present.     Mental Status: Alert and oriented to person, place and time.       ASSESSMENT & PLAN:    1. Coronary artery disease involving native coronary artery of native heart without angina pectoris History of myocardial infarction in 2008 treated with a drug-eluting stent to the LCx.  Myoview in 2012 was negative for ischemia.  EF by echocardiogram July 2019 was 55-60%.  He is overall doing well without anginal symptoms.  He is not on aspirin as he is on Apixaban.  Continue atorvastatin, carvedilol.  2. Typical atrial flutter (HCC) Heart rate is controlled.  He is tolerating anticoagulation.  Apixaban is significantly expensive for him.  We will try to arrange assistance if possible.  Have also asked him to check with his pharmacy to see if Rivaroxaban would be cheaper.  We also discussed the possibility of switching to warfarin.  He would like to avoid this.  He is due for labs with his primary care physician in June or July.  We will request his BMET and CBC from that visit.  3. Essential hypertension The patient's blood pressure is controlled on his current regimen.  Continue current therapy.   4. Mixed hyperlipidemia LDL optimal on most recent lab work.  Continue current Rx.    5. Tobacco abuse I have suggested he quit smoking.   Dispo:  Return in about 6 months (around 03/22/2020) for Routine Follow Up, w/ Dr. Burt Knack, or Richardson Dopp, PA-C, in person.   Medication Adjustments/Labs and Tests Ordered: Current medicines are reviewed at length with the patient today.  Concerns regarding medicines are outlined above.  Tests Ordered: Orders Placed This Encounter  Procedures  . EKG 12-Lead   Medication Changes: No  orders of the defined types were placed in this encounter.   Signed, Richardson Dopp, PA-C  09/21/2019 10:08 AM    [facility] [address], [place], [place]  [address] Phone: [phone]; Fax: [phone]   Addendum: Labs from PCP 12/06/2019 Total Chol 97, Trig 109, HDL 37, LDL 40 SCr 0.93, K 4.8, ALT 15, Hgb  [address], PA-C    12/23/2019 8:21 AM

## 2019-09-21 ENCOUNTER — Other Ambulatory Visit: Payer: Self-pay

## 2019-09-21 ENCOUNTER — Encounter: Payer: Self-pay | Admitting: Physician Assistant

## 2019-09-21 ENCOUNTER — Ambulatory Visit: Payer: Medicare Other | Admitting: Physician Assistant

## 2019-09-21 VITALS — BP 112/70 | HR 62 | Ht 68.0 in | Wt 218.8 lb

## 2019-09-21 DIAGNOSIS — E782 Mixed hyperlipidemia: Secondary | ICD-10-CM

## 2019-09-21 DIAGNOSIS — I10 Essential (primary) hypertension: Secondary | ICD-10-CM

## 2019-09-21 DIAGNOSIS — I483 Typical atrial flutter: Secondary | ICD-10-CM | POA: Diagnosis not present

## 2019-09-21 DIAGNOSIS — Z720 Tobacco use: Secondary | ICD-10-CM

## 2019-09-21 DIAGNOSIS — I2510 Atherosclerotic heart disease of native coronary artery without angina pectoris: Secondary | ICD-10-CM | POA: Diagnosis not present

## 2019-09-21 NOTE — Patient Instructions (Signed)
Medication Instructions:   Your physician recommends that you continue on your current medications as directed. Please refer to the Current Medication list given to you today.  *If you need a refill on your cardiac medications before your next appointment, please call your pharmacy*  Lab Work:  None ordered today, when you see your primary care physician in July have them check BMET, CBC, lipid panel and fax results to [phone]  Testing/Procedures:  None ordered today  Follow-Up: At [facility], you and your health needs are our priority.  As part of our continuing mission to provide you with exceptional heart care, we have created designated Provider Care Teams.  These Care Teams include your primary Cardiologist (physician) and Advanced Practice Providers (APPs -  Physician Assistants and Nurse Practitioners) who all work together to provide you with the care you need, when you need it.  We recommend signing up for the patient portal called "MyChart".  Sign up information is provided on this After Visit Summary.  MyChart is used to connect with patients for Virtual Visits (Telemedicine).  Patients are able to view lab/test results, encounter notes, upcoming appointments, etc.  Non-urgent messages can be sent to your provider as well.   To learn more about what you can do with MyChart, go to [email or web address].    Your next appointment:   6 month(s)  The format for your next appointment:   In Person  Provider:   You may see [name], MD or Tereso Newcomer, PA-C  Other Instructions  Check with your pharmacy to see if Xarelto 20 mg once a day is cheaper than Eliquis 5 mg twice a day. If Xarelto is cheaper, call the office to let us know.

## 2019-12-10 IMAGING — CT CT CHEST NODULE FOLLOW UP LOW DOSE WO CONTRAST
2 of 5 series · 15 of 40 positions shown, 18 images · non-contrast
Comparison: Low-dose lung cancer screening CT chest dated
06/12/2018

CLINICAL DATA: 78-year-old male current smoker, with 40 pack-year
history of smoking, for short-term follow-up lung cancer screening

EXAM:
CT CHEST WITHOUT CONTRAST
TECHNIQUE: Multidetector CT imaging of the chest was performed following the
standard protocol without IV contrast.

[Series 4: lcs f/u 1.00 br60 s3 axial lung · axial · 0.80mm/px · z∈[-1012,-724]mm · 12 of 318 slices shown, 15 images]
[im 15/318  mediastinal]
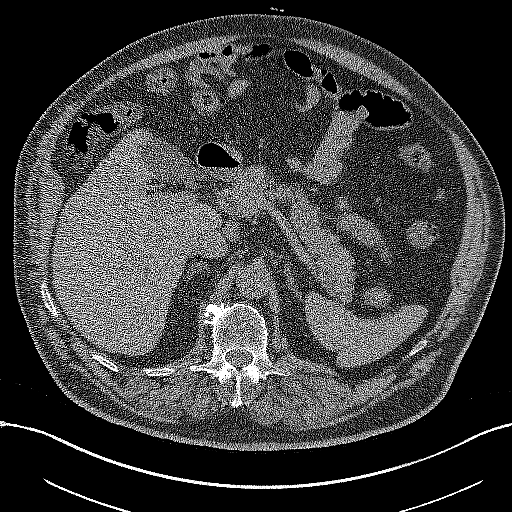
[im 15/318  lung]
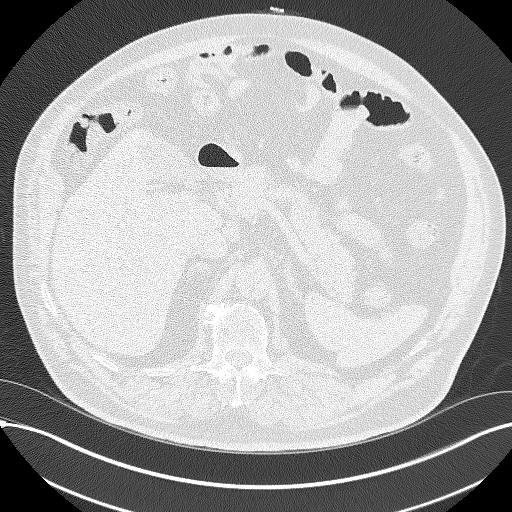
[im 44/318  lung]
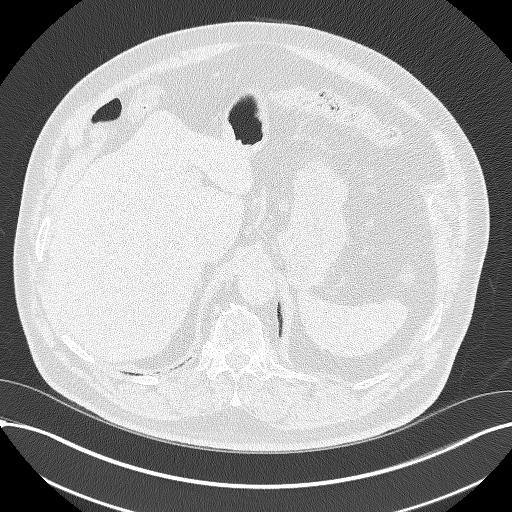
[im 73/318  lung]
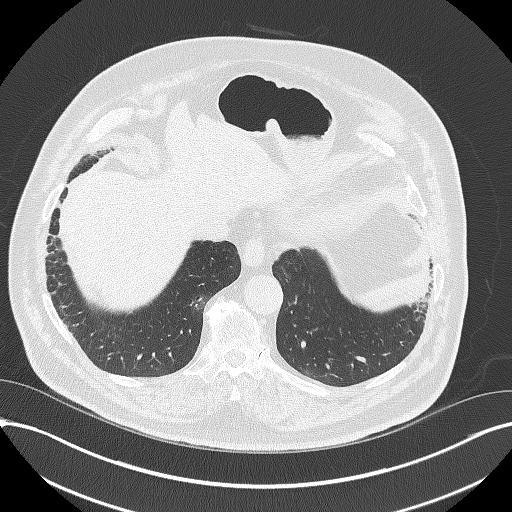
[im 101/318  lung]
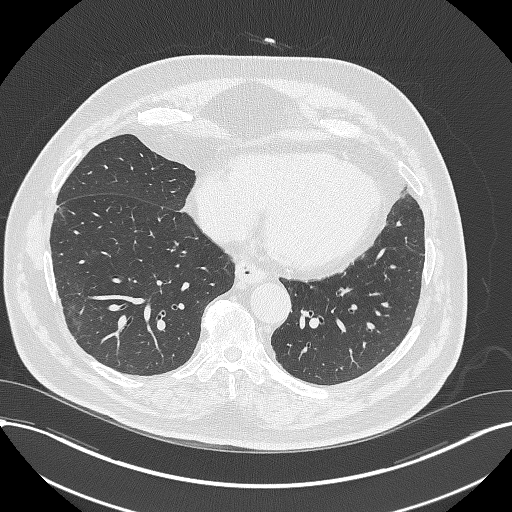
[im 116/318  mediastinal]
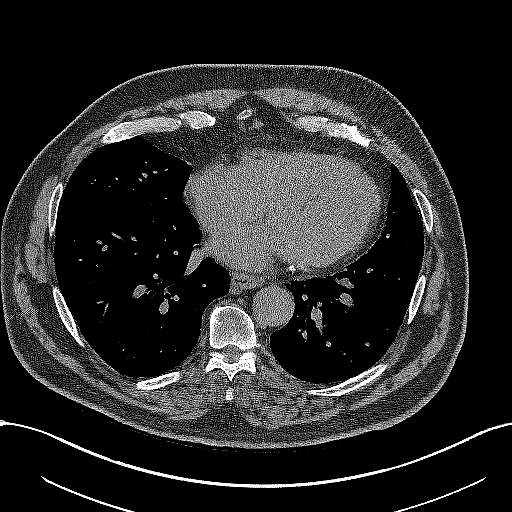
[im 116/318  lung]
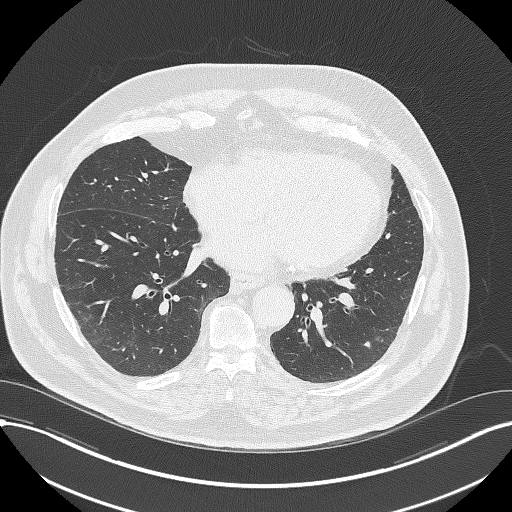
[im 145/318  lung]
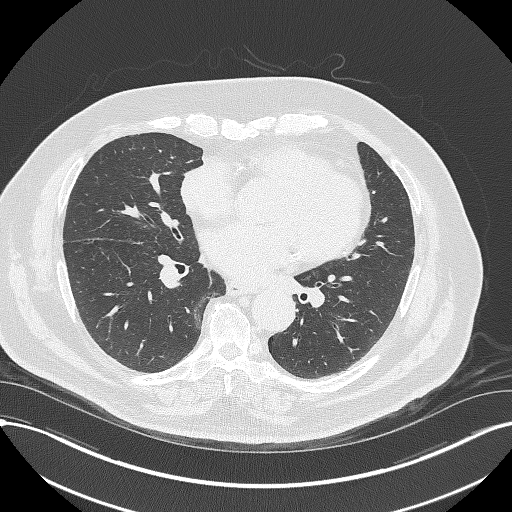
[im 173/318  lung]
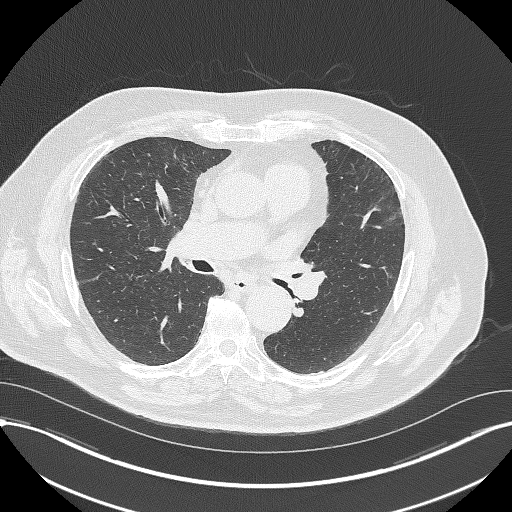
[im 202/318  lung]
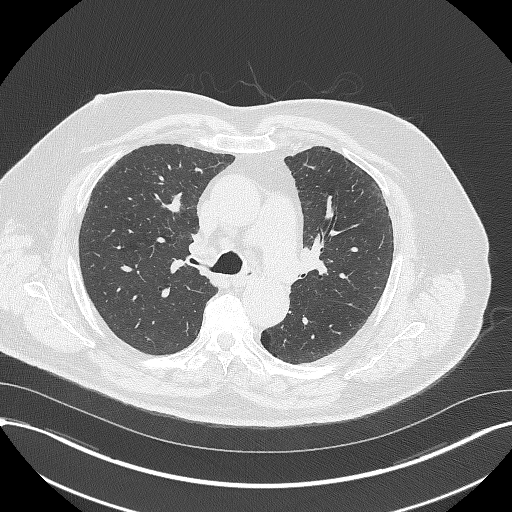
[im 217/318  mediastinal]
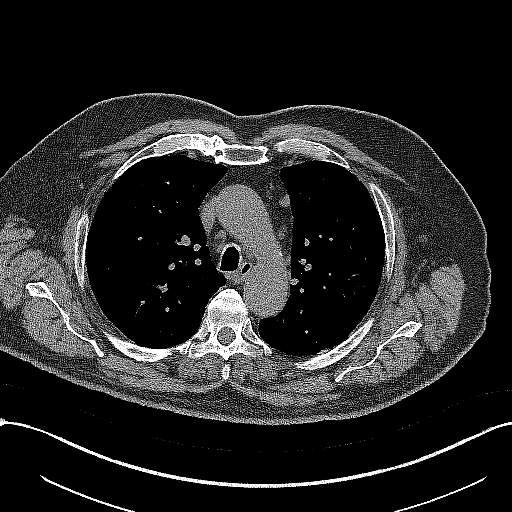
[im 217/318  lung]
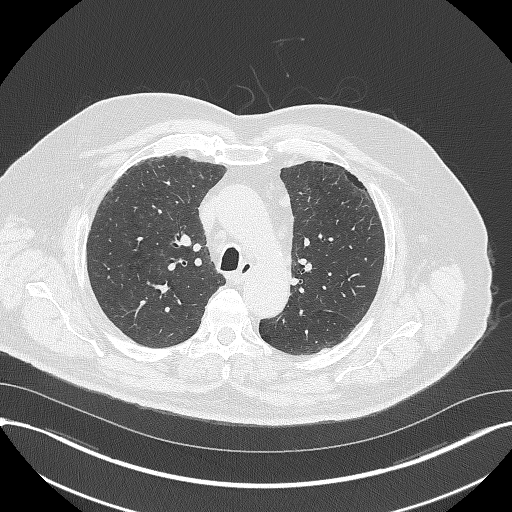
[im 245/318  lung]
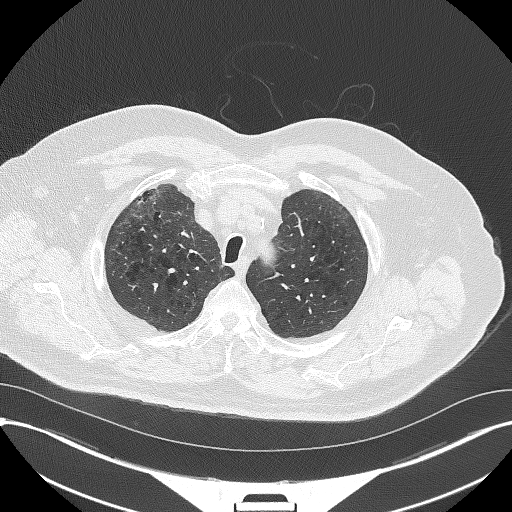
[im 274/318  lung]
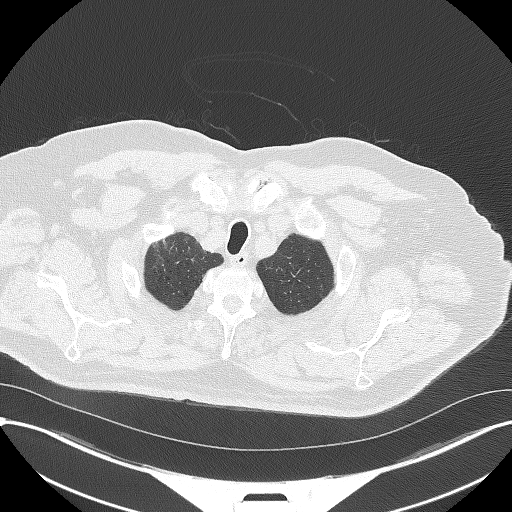
[im 303/318  lung]
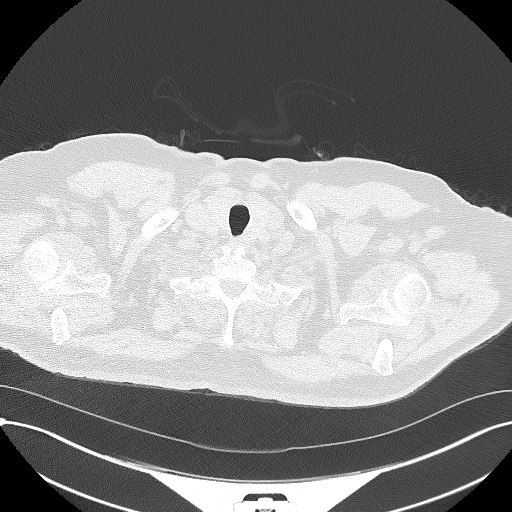

[Series 5: lcs f/u 1.00 br44 s3 cor · coronal · 0.62mm/px · 3 of 375 slices shown]
[im 75/375  lung]
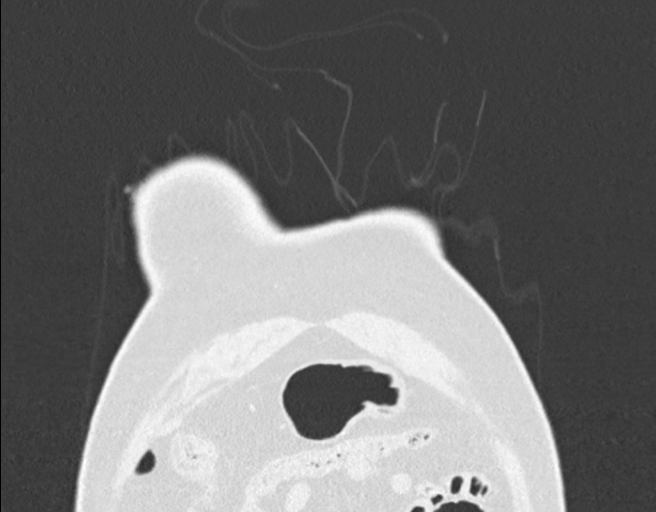
[im 150/375  lung]
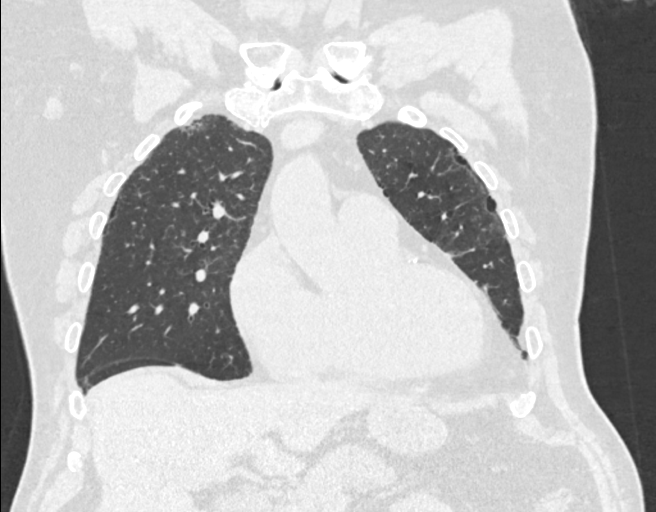
[im 225/375  lung]
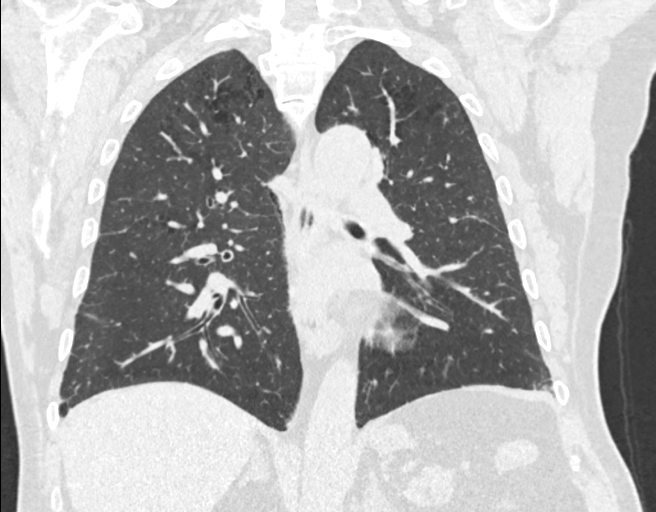

[15 of 40 positions shown; findings below may reference images not displayed]

FINDINGS: Cardiovascular: The heart is normal in size. No pericardial
effusion.

No evidence of thoracic aortic aneurysm.

Three vessel coronary atherosclerosis.

Mediastinum/Nodes: Small mediastinal lymph nodes which do not meet
pathologic CT size criteria.

Visualized thyroid is unremarkable.

Lungs/Pleura: Moderate centrilobular and paraseptal emphysematous
changes, upper lung predominant. Superimposed mild subpleural
reticulation/fibrosis at the lung bases.

Scattered small bilateral pulmonary nodules, including a dominant
6.3 mm nodule in the left lung apex, unchanged.

No focal consolidation.

No pleural effusion or pneumothorax.

Upper Abdomen: Visualized upper abdomen is grossly unremarkable.

Musculoskeletal: Degenerative changes of the visualized
thoracolumbar spine.
IMPRESSION: Lung-RADS 2, benign appearance or behavior. Continue annual
screening with low-dose chest CT without contrast in 12 months.

Emphysema ([id number]).

## 2019-12-17 ENCOUNTER — Telehealth: Payer: Self-pay

## 2019-12-17 NOTE — Telephone Encounter (Signed)
Signed and faxed

## 2019-12-17 NOTE — Telephone Encounter (Signed)
**Note De-Identified Amahia Madonia Obfuscation** Provider page of a [employer] pt asst application for Eliquis was faxed to the office from [place] at [employer] with request for [place] to complete the page, have Dr Excell Seltzer sign and date it and to fax it back to her at [phone].  I have completed the provider page of the [facility] Pt Asst application and scanned and emailed it to Dr Randolm Idol nurse so she can obtain Dr Randolm Idol signature, date it and to fax back to [place] at the fax number written on the cover letter included.

## 2020-02-22 ENCOUNTER — Other Ambulatory Visit: Payer: Self-pay | Admitting: Cardiovascular Disease

## 2020-03-09 ENCOUNTER — Other Ambulatory Visit: Payer: Self-pay | Admitting: Cardiovascular Disease

## 2020-03-09 NOTE — Telephone Encounter (Signed)
Pt last saw Tereso Newcomer 09/21/19, last labs 12/06/19 Creat 0.93 at [facility] per [facility], age 79, weight 99.2kg, based on specified criteria pt is on appropriate dosage of Eliquis 5mg  BID.  Will refill rx.

## 2020-03-21 NOTE — Progress Notes (Signed)
Cardiology Office Note  Date:  03/22/2020   ID:  MAURO ARPS, DOB 14-Apr-1941, MRN [id number]  PCP:  Merlene Laughter, MD  Cardiologist:  Dr. Excell Seltzer  _____________  6 month follow-up  _____________   History of Present Illness: Grant Flores is a 79 y.o. male with pmh of CAD s/p BMS to LCx, atrial flutter on Eliquis, tobacco use, HTN, HLD who presents for follow-up.  Last cardiac cath was in 2008 with BMS to LCx. He had a Myoview in 2012 showing EF 42% and no ischemia. Echo in 2019 showed LVEF 55-60%, severe LVH, no WMA, G1DD, mod RAE.   Last seen 09/21/19 and was symptomatically stable. Labs were requested, otherwise no changes were made.   Today, he reports he is overall doing well. He is wondering about another blood thinner besides Eliquis due to high cost. Appears in the past the office completed paper work for patient assistance program. Patient will go home and check to see if he sent in his part of the paper work. Patient denies bleeding issues. EKG shows rate controlled aflutter. BP good today. He is still smoking and has no desire to quit. He lives with his wife. Says most meals are prepared at home. He uses a cane when he goes out of the house. He notices lower leg swelling when he eats more salt. He plans to get the flu shot today. He denies symptoms of palpitations, chest pain, shortness of breath, orthopnea, PND, claudication, dizziness, presyncope, syncope, bleeding, or neurologic sequela. The patient is tolerating medications without difficulties and is otherwise without complaint today.   _____________   Past Medical History:  Diagnosis Date  . Asthma   . CAD (coronary artery disease) October 2008   s/p inferolateral STEMI.... PCI with BMS to LCX. (cath with EF 50%, mod inf HK, RCA 40%, small oD3 70%, OM1 40%, CFX occluded - tx with PCI);  echo 10/08: EF 45%, mild RVE;  Myoview 2009. Inferior infarct. no ischemia, EF 45%  . COPD (chronic obstructive pulmonary disease)  (HCC)    with ongoing tobacco  . High cholesterol   . Hypertension   . Myocardial infarction (HCC) 2008  . Obesity   . Shortness of breath    with exertion    Past Surgical History:  Procedure Laterality Date  . ANTERIOR CERVICAL DECOMP/DISCECTOMY FUSION  before 2008  . APPENDECTOMY  age 85  . ARTHRODESIS     C4-C6  . Bare Metal Stent    . CARDIAC CATHETERIZATION  2008   cardiologist: Dr. Sharlet Salina   . CARDIOVASCULAR STRESS TEST  04/03/2011  . CARPAL TUNNEL RELEASE  unknown   Bilateral   . TOTAL KNEE ARTHROPLASTY  05/24/2011   Procedure: TOTAL KNEE ARTHROPLASTY;  Surgeon: Thera Flake., MD;  Location: [facility] OR;  Service: Orthopedics;  Laterality: Right;  RIGHT ARTHROSCOPY TOTAL KNEE   _____________  Current Outpatient Medications  Medication Sig Dispense Refill  . amLODipine (NORVASC) 5 MG tablet TAKE 1 TABLET BY MOUTH  DAILY 90 tablet 2  . atorvastatin (LIPITOR) 80 MG tablet TAKE 1 TABLET BY MOUTH  DAILY 90 tablet 2  . carvedilol (COREG) 6.25 MG tablet TAKE 1 TABLET BY MOUTH  TWICE DAILY WITH MEALS 180 tablet 2  . ELIQUIS 5 MG TABS tablet TAKE 1 TABLET BY MOUTH  TWICE DAILY 180 tablet 2  . SPIRIVA RESPIMAT 2.5 MCG/ACT AERS Inhale into the lungs as needed.     No current facility-administered medications for this  visit.   _____________   Allergies:   Patient has no known allergies.  _____________   Social History:  The patient  reports that he has been smoking. He has been smoking about 1.50 packs per day. He has never used smokeless tobacco. He reports that he does not drink alcohol and does not use drugs.  _____________   Family History:  The patient's family history includes Heart attack (age of onset: 76) in his father.  _____________   ROS:  Please see the history of present illness.   All other systems are reviewed and negative.  _____________   PHYSICAL EXAM: VS:  BP 118/68   Pulse (!) 59   Ht 5\' 8"  (1.727 m)   Wt 215 lb (97.5 kg)   SpO2 98%   BMI 32.69  kg/m  , BMI Body mass index is 32.69 kg/m. GEN: Well nourished, well developed, in no acute distress  HEENT: normal  Neck: no JVD, carotid bruits, or masses Cardiac: Reg Irreg; no murmurs, rubs, or gallops. No clubbing, cyanosis, edema.  Radials/DP/PT 2+ and equal bilaterally.  Respiratory:  clear to auscultation bilaterally, normal work of breathing GI: soft, nontender, nondistended, + BS MS: no deformity or atrophy  Skin: warm and dry, no rash Neuro:  Strength and sensation are intact Psych: euthymic mood, full affect _____________  EKG:   The ekg ordered today shows Aflutter, PVC, 59bpm  Recent Labs: No results found for requested labs within last 8760 hours.  No results found for requested labs within last 8760 hours.  CrCl cannot be calculated (Patient's most recent lab result is older than the maximum 21 days allowed.).  Wt Readings from Last 3 Encounters:  03/22/20 215 lb (97.5 kg)  09/21/19 218 lb 12.8 oz (99.2 kg)  03/22/19 221 lb (100.2 kg)     Echo 12/2017  - Left ventricle: The cavity size was normal. Wall thickness was  increased in a pattern of severe LVH. There was focal basal  hypertrophy. Systolic function was normal. The estimated ejection  fraction was in the range of 55% to 60%. Wall motion was normal;  there were no regional wall motion abnormalities. Doppler  parameters are consistent with abnormal left ventricular  relaxation (grade 1 diastolic dysfunction).  - Right atrium: The atrium was moderately dilated.   Myoview stress test 2012 Notes Recorded by 2013, PA on 04/10/2011 at 2:01 PM Low risk myoview. Findings consistent with prior myoview. EF consistent with prior EF.  _____________   ASSESSMENT AND PLAN:  CAD with prior stenting - history of MI in 2008 with BMS to LCx - Myoview in 2012 was normal.  - Echo in [place] 2019 showed EF 55-60% - Denies CP  - continue statin, and coreg.   Typical Aflutter - EKG shows ate  controlled Aflutter - Eliquis for a/c. Denies bleeding issues. Patient will check at home to see if he completed his part of the paper work for patient assistance. Appears the office completed their part. Will send a message to nurse/director to follow-up on this. Also discussed Xarelto, Pradaxa and coumadin. Patient will get back to 06-22-2003.   HTN - BP 118/68 - continue coreg and amlodipine  HLD - continue statin - LDL 40 11/2019  Tobacco use - cessation discussed however patient with no desire to quit   Disposition:   FU with Dr. 12/2019 in 6 months   Signed, Jaselle Pryer Excell Seltzer, PA-C 03/22/2020 10:53 AM    _____________ [facility] [address]  [address] [place] [address]  [phone] (office) [phone] (fax)

## 2020-03-22 ENCOUNTER — Other Ambulatory Visit: Payer: Self-pay

## 2020-03-22 ENCOUNTER — Ambulatory Visit (INDEPENDENT_AMBULATORY_CARE_PROVIDER_SITE_OTHER): Payer: Medicare Other | Admitting: Medical

## 2020-03-22 ENCOUNTER — Encounter: Payer: Self-pay | Admitting: Physician Assistant

## 2020-03-22 VITALS — BP 118/68 | HR 59 | Ht 68.0 in | Wt 215.0 lb

## 2020-03-22 DIAGNOSIS — I2510 Atherosclerotic heart disease of native coronary artery without angina pectoris: Secondary | ICD-10-CM | POA: Diagnosis not present

## 2020-03-22 DIAGNOSIS — I483 Typical atrial flutter: Secondary | ICD-10-CM | POA: Diagnosis not present

## 2020-03-22 DIAGNOSIS — E782 Mixed hyperlipidemia: Secondary | ICD-10-CM

## 2020-03-22 DIAGNOSIS — F17200 Nicotine dependence, unspecified, uncomplicated: Secondary | ICD-10-CM

## 2020-03-22 DIAGNOSIS — I10 Essential (primary) hypertension: Secondary | ICD-10-CM

## 2020-03-22 NOTE — Patient Instructions (Signed)
Medication Instructions:  Your physician recommends that you continue on your current medications as directed. Please refer to the Current Medication list given to you today.  *If you need a refill on your cardiac medications before your next appointment, please call your pharmacy*  Lab Work: None ordered today  Testing/Procedures: None ordered today  Follow-Up: At [facility], you and your health needs are our priority.  As part of our continuing mission to provide you with exceptional heart care, we have created designated Provider Care Teams.  These Care Teams include your primary Cardiologist (physician) and Advanced Practice Providers (APPs -  Physician Assistants and Nurse Practitioners) who all work together to provide you with the care you need, when you need it.  Your next appointment:   6 month(s)  The format for your next appointment:   In Person  Provider:   Michael Cooper, MD  

## 2020-03-23 ENCOUNTER — Telehealth: Payer: Self-pay

## 2020-03-23 NOTE — Telephone Encounter (Addendum)
**Note De-identified Donise Woodle Obfuscation** -----  **Note De-Identified Diera Wirkkala Obfuscation** Message from Beatrice Lecher, [place] sent at 03/22/2020 10:39 AM EDT -----  This pt had paperwork sent in in July for Eliquis assistance. I'm not sure if he did not send something in or just has not heard yet. Can you follow up with him and see if we need to do anything else? Thanks! Scott

## 2020-03-23 NOTE — Telephone Encounter (Signed)
**Note De-Identified Grant Flores Obfuscation** Per note in the pts chart from 7/2 we completed the MD page of a [employer] pt asst application, Dr Excell Seltzer signed it, and his nurse faxed it to Grant Flores at [facility] per her request.  I called the pt and he is aware that Grant Flores at [facility] was handling his application but was wondering if wehad heard anything concerning his application for Eliquis. [place] that he is scheduled to see Grant Flores tomorrow and has already wrote himself a reminder note  to ask her about it tomorrow. I have advised him that if he or Grant Flores need anything from [place] concerning his application to please let us know as we need to be sure that he has his Eliquis as he cannot miss a dose.  He states that he will let me know if he was not approved for asst for his Eliquis after he s/w Grant Flores.  He verbalized understanding and thanked me for calling him back.

## 2020-08-14 DIAGNOSIS — E7800 Pure hypercholesterolemia, unspecified: Secondary | ICD-10-CM | POA: Diagnosis not present

## 2020-08-14 DIAGNOSIS — I2510 Atherosclerotic heart disease of native coronary artery without angina pectoris: Secondary | ICD-10-CM | POA: Diagnosis not present

## 2020-08-14 DIAGNOSIS — J449 Chronic obstructive pulmonary disease, unspecified: Secondary | ICD-10-CM | POA: Diagnosis not present

## 2020-08-14 DIAGNOSIS — I10 Essential (primary) hypertension: Secondary | ICD-10-CM | POA: Diagnosis not present

## 2020-09-05 DIAGNOSIS — I2510 Atherosclerotic heart disease of native coronary artery without angina pectoris: Secondary | ICD-10-CM | POA: Diagnosis not present

## 2020-09-05 DIAGNOSIS — E7800 Pure hypercholesterolemia, unspecified: Secondary | ICD-10-CM | POA: Diagnosis not present

## 2020-09-05 DIAGNOSIS — J449 Chronic obstructive pulmonary disease, unspecified: Secondary | ICD-10-CM | POA: Diagnosis not present

## 2020-09-05 DIAGNOSIS — I10 Essential (primary) hypertension: Secondary | ICD-10-CM | POA: Diagnosis not present

## 2020-09-22 DIAGNOSIS — I10 Essential (primary) hypertension: Secondary | ICD-10-CM | POA: Diagnosis not present

## 2020-09-22 DIAGNOSIS — J449 Chronic obstructive pulmonary disease, unspecified: Secondary | ICD-10-CM | POA: Diagnosis not present

## 2020-09-22 DIAGNOSIS — I2510 Atherosclerotic heart disease of native coronary artery without angina pectoris: Secondary | ICD-10-CM | POA: Diagnosis not present

## 2020-09-22 DIAGNOSIS — E7800 Pure hypercholesterolemia, unspecified: Secondary | ICD-10-CM | POA: Diagnosis not present

## 2020-11-10 ENCOUNTER — Other Ambulatory Visit: Payer: Self-pay | Admitting: Cardiovascular Disease

## 2020-11-10 NOTE — Telephone Encounter (Signed)
Pt was last seen by Cadence Furth, PA on 03/22/20, last labs 12/06/19 Creat 0.93 at [facility] per [facility], age 80, weight 97.5kg, based on specified criteria pt is on appropriate dosage of Eliquis 5mg  BID.  Will refill rx.

## 2020-11-23 DIAGNOSIS — J449 Chronic obstructive pulmonary disease, unspecified: Secondary | ICD-10-CM | POA: Diagnosis not present

## 2020-11-23 DIAGNOSIS — I10 Essential (primary) hypertension: Secondary | ICD-10-CM | POA: Diagnosis not present

## 2020-11-23 DIAGNOSIS — I2510 Atherosclerotic heart disease of native coronary artery without angina pectoris: Secondary | ICD-10-CM | POA: Diagnosis not present

## 2020-11-23 DIAGNOSIS — E7800 Pure hypercholesterolemia, unspecified: Secondary | ICD-10-CM | POA: Diagnosis not present

## 2020-11-27 DIAGNOSIS — Z0000 Encounter for general adult medical examination without abnormal findings: Secondary | ICD-10-CM | POA: Diagnosis not present

## 2020-11-27 DIAGNOSIS — M48061 Spinal stenosis, lumbar region without neurogenic claudication: Secondary | ICD-10-CM | POA: Diagnosis not present

## 2020-11-27 DIAGNOSIS — I483 Typical atrial flutter: Secondary | ICD-10-CM | POA: Diagnosis not present

## 2020-11-27 DIAGNOSIS — D6869 Other thrombophilia: Secondary | ICD-10-CM | POA: Diagnosis not present

## 2020-11-27 DIAGNOSIS — I10 Essential (primary) hypertension: Secondary | ICD-10-CM | POA: Diagnosis not present

## 2020-11-27 DIAGNOSIS — J449 Chronic obstructive pulmonary disease, unspecified: Secondary | ICD-10-CM | POA: Diagnosis not present

## 2020-11-27 DIAGNOSIS — D692 Other nonthrombocytopenic purpura: Secondary | ICD-10-CM | POA: Diagnosis not present

## 2020-11-27 DIAGNOSIS — I700 Atherosclerosis of aorta: Secondary | ICD-10-CM | POA: Diagnosis not present

## 2020-11-27 DIAGNOSIS — E7800 Pure hypercholesterolemia, unspecified: Secondary | ICD-10-CM | POA: Diagnosis not present

## 2020-11-27 DIAGNOSIS — F17200 Nicotine dependence, unspecified, uncomplicated: Secondary | ICD-10-CM | POA: Diagnosis not present

## 2020-11-27 DIAGNOSIS — Z79899 Other long term (current) drug therapy: Secondary | ICD-10-CM | POA: Diagnosis not present

## 2020-11-27 DIAGNOSIS — Z1389 Encounter for screening for other disorder: Secondary | ICD-10-CM | POA: Diagnosis not present

## 2020-12-19 ENCOUNTER — Encounter: Payer: Self-pay | Admitting: Physician Assistant

## 2020-12-19 ENCOUNTER — Telehealth: Payer: Self-pay

## 2020-12-19 ENCOUNTER — Encounter (INDEPENDENT_AMBULATORY_CARE_PROVIDER_SITE_OTHER): Payer: Self-pay

## 2020-12-19 ENCOUNTER — Ambulatory Visit (INDEPENDENT_AMBULATORY_CARE_PROVIDER_SITE_OTHER): Payer: Medicare Other | Admitting: Physician Assistant

## 2020-12-19 ENCOUNTER — Other Ambulatory Visit: Payer: Self-pay

## 2020-12-19 VITALS — BP 122/80 | HR 68 | Ht 67.0 in | Wt 219.2 lb

## 2020-12-19 DIAGNOSIS — R600 Localized edema: Secondary | ICD-10-CM | POA: Diagnosis not present

## 2020-12-19 DIAGNOSIS — I10 Essential (primary) hypertension: Secondary | ICD-10-CM

## 2020-12-19 DIAGNOSIS — I2510 Atherosclerotic heart disease of native coronary artery without angina pectoris: Secondary | ICD-10-CM

## 2020-12-19 DIAGNOSIS — I483 Typical atrial flutter: Secondary | ICD-10-CM | POA: Diagnosis not present

## 2020-12-19 DIAGNOSIS — J449 Chronic obstructive pulmonary disease, unspecified: Secondary | ICD-10-CM

## 2020-12-19 DIAGNOSIS — E782 Mixed hyperlipidemia: Secondary | ICD-10-CM | POA: Diagnosis not present

## 2020-12-19 DIAGNOSIS — Z720 Tobacco use: Secondary | ICD-10-CM | POA: Diagnosis not present

## 2020-12-19 MED ORDER — FUROSEMIDE 20 MG PO TABS: 20.0000 mg | ORAL_TABLET | ORAL | 1 refills | Status: DC | PRN

## 2020-12-19 NOTE — Progress Notes (Signed)
Cardiology Office Note:    Date:  12/19/2020   ID:  [name], DOB 06/10/1941, MRN [id number]  PCP:  Merlene Laughter, MD   [facility] Providers Cardiologist:  [name], MD Cardiology APP:  Kennon Rounds      Referring MD: Merlene Laughter, MD   Chief Complaint:  Follow-up (CAD)    Patient Profile:    Grant Flores is a 80 y.o. male with:  Coronary artery disease S/p inf-lat MI in 2008 >> PCI: BMS to LCx Atrial flutter CHA2DS2-VASc=4 (age x 2, CAD, HTN) >> Apixaban COPD Pulmonary nodule Tobacco use Hypertension Hyperlipidemia    Prior CV studies: Echocardiogram 01/14/18 Severe LVH, EF 55-60, no RWMA, Gr 1 DD, mod RAE   Myoview 04/15/11 EF 42, inf-lat scar, no ischemia   Cardiac catheterization 05/18/07 EF 50 RCA 40 LAD irregs; D2 and D3 ost 70 LCx 100, OM1 40 PCI:  BMS to LCx  History of Present Illness: Grant Flores was last seen in 10/21.  He returns for follow-up. He is here alone.  He is overall doing well.  He still smokes.  He has some knee problems and walks with a cane.  He has not fallen.  He still has trouble affording Apixaban.  It is not clear if he ever heard from [employer].  He has not had chest pain, significant shortness of breath, syncope, orthopnea.  He does have leg edema.  He notes resolution with elevation.          Past Medical History:  Diagnosis Date   Asthma    CAD (coronary artery disease) October 2008   s/p inferolateral STEMI.... PCI with BMS to LCX. (cath with EF 50%, mod inf HK, RCA 40%, small oD3 70%, OM1 40%, CFX occluded - tx with PCI);  echo 10/08: EF 45%, mild RVE;  Myoview 2009. Inferior infarct. no ischemia, EF 45%   COPD (chronic obstructive pulmonary disease) (HCC)    with ongoing tobacco   High cholesterol    Hypertension    Myocardial infarction [facility]) 2008   Obesity    Shortness of breath    with exertion     Current Medications: Current Meds  Medication Sig   amLODipine (NORVASC) 5 MG  tablet TAKE 1 TABLET BY MOUTH  DAILY   atorvastatin (LIPITOR) 80 MG tablet TAKE 1 TABLET BY MOUTH  DAILY   carvedilol (COREG) 6.25 MG tablet TAKE 1 TABLET BY MOUTH  TWICE DAILY WITH MEALS   ELIQUIS 5 MG TABS tablet TAKE 1 TABLET BY MOUTH  TWICE DAILY   furosemide (LASIX) 20 MG tablet Take 1 tablet (20 mg total) by mouth as needed for fluid or edema.   SPIRIVA RESPIMAT 2.5 MCG/ACT AERS Inhale into the lungs as needed.     Allergies:   Patient has no known allergies.   Social History   Tobacco Use   Smoking status: Every Day    Packs/day: 1.50    Pack years: 0.00    Types: Cigarettes   Smokeless tobacco: Never  Substance Use Topics   Alcohol use: No   Drug use: No     Family Hx: The patient's family history includes Heart attack (age of onset: 7) in his father. There is no history of Coronary artery disease.  ROS   EKGs/Labs/Other Test Reviewed:    EKG:  EKG is not ordered today.  The ekg ordered today demonstrates n/a  Recent Labs: No results found for requested labs within last 8760  hours.   Recent Lipid Panel   Labs obtained through [facility] - personally reviewed and interpreted: 11/27/2020: Total cholesterol 94, HDL 37, LDL 38, triglycerides 101, Hgb 15.7, creatinine 1.0, K+ 4.4, ALT 22  Risk Assessment/Calculations:    CHA2DS2-VASc Score = 4  This indicates a 4.8% annual risk of stroke. The patient's score is based upon: CHF History: No HTN History: Yes Diabetes History: No Stroke History: No Vascular Disease History: Yes Age Score: 2 Gender Score: 0    Physical Exam:    VS:  BP 122/80   Pulse 68   Ht 5\' 7"  (1.702 m)   Wt 219 lb 3.2 oz (99.4 kg)   SpO2 96%   BMI 34.33 kg/m     Wt Readings from Last 3 Encounters:  12/19/20 219 lb 3.2 oz (99.4 kg)  03/22/20 215 lb (97.5 kg)  09/21/19 218 lb 12.8 oz (99.2 kg)     Constitutional:      Appearance: Healthy appearance. Not in distress.  Neck:     Vascular: JVD normal.  Pulmonary:     Effort:  Pulmonary effort is normal.     Breath sounds: Diffuse Wheezing (low pitched; ins and exp throughout) present.  Cardiovascular:     Normal rate. Regular rhythm. Normal S1. Normal S2.      Murmurs: There is no murmur.  Edema:    Pretibial: bilateral 1+ edema of the pretibial area.    Ankle: bilateral 1+ edema of the ankle. Abdominal:     Palpations: Abdomen is soft. There is no hepatomegaly.  Skin:    General: Skin is warm and dry.  Neurological:     General: No focal deficit present.     Mental Status: Alert and oriented to person, place and time.     Cranial Nerves: Cranial nerves are intact.         ASSESSMENT & PLAN:    1. Coronary artery disease involving native coronary artery of native heart without angina pectoris History of inferolateral MI in 2008 treated with a bare-metal stent to the LCx.  He is doing well without anginal symptoms.  Myoview in 2012 is low risk.  He is not on aspirin as he is on Apixaban.  Continue atorvastatin, carvedilol.  Follow-up in 6 months.  2. Typical atrial flutter (HCC) Rate is well controlled.  He is tolerating anticoagulation.  He is on the appropriate dose of Apixaban.  Continue Apixaban 5 mg twice daily.  He has difficulty affording apixaban.  I offered to switch him to warfarin.  He just ordered a 42-month supply.  I have asked him to contact 2-month when he is getting close to his last month so that we can arrange transition from apixaban to warfarin in our anticoagulation clinic.  Recent hemoglobin, creatinine normal.  3. Essential hypertension The patient's blood pressure is controlled on his current regimen.  Continue current therapy.   4. Mixed hyperlipidemia LDL optimal on most recent lab work.  Continue current Rx.    5. Leg edema He has dependent leg edema.  This is likely related to venous insufficiency.  It may be somewhat worsened by amlodipine.  He does seem to have volume overload.  I have recommended elevation and compression  stockings.  I will give him furosemide 20 mg to take as needed.  I have asked him to contact me if he takes more than 2 doses in a row.  6. Chronic obstructive pulmonary disease, unspecified COPD type (HCC) 7. Tobacco abuse  He continues to smoke.  I have again recommended cessation.    Dispo:  Return in about 6 months (around 06/21/2021) for Routine follow up in 6 months with Dr.Cooper or Tereso Newcomer, PA-C.   Medication Adjustments/Labs and Tests Ordered: Current medicines are reviewed at length with the patient today.  Concerns regarding medicines are outlined above.  Tests Ordered: No orders of the defined types were placed in this encounter.  Medication Changes: Meds ordered this encounter  Medications   furosemide (LASIX) 20 MG tablet    Sig: Take 1 tablet (20 mg total) by mouth as needed for fluid or edema.    Dispense:  20 tablet    Refill:  1    Signed, Tereso Newcomer, PA-C  12/19/2020 5:31 PM    [facility] [address] [place], [place], [place]  [address] Phone: [phone]; Fax: [phone]

## 2020-12-19 NOTE — Telephone Encounter (Signed)
Pt came in for an appt with Grant Flores today and stated that his Eliquis was $320. Grant Flores gave him the number to call Grant Flores to check the status of an application (he couldn't remember if he completed it or not)  I called [employer] and they have never received anything from him.  I called the pt and spoke to him and his Wife and told him to call the number and ask them to mail him an application and to fill out his part then bring to our office and we would complete the provider portion and fax it in for him.  I told him if he needed help filling out his portion to call us and let us know and we could help him.

## 2020-12-19 NOTE — Patient Instructions (Signed)
Medication Instructions:   START Lasix one tablet by mouth ( 20 mg) as needed for swelling.  Call if you take this medication X 2 days @ [phone].   *If you need a refill on your cardiac medications before your next appointment, please call your pharmacy*   Lab Work:  -None   If you have labs (blood work) drawn today and your tests are completely normal, you will receive your results only by: MyChart Message (if you have MyChart) OR A paper copy in the mail If you have any lab test that is abnormal or we need to change your treatment, we will call you to review the results.   Testing/Procedures:  -None  Follow-Up: At [facility], you and your health needs are our priority.  As part of our continuing mission to provide you with exceptional heart care, we have created designated Provider Care Teams.  These Care Teams include your primary Cardiologist (physician) and Advanced Practice Providers (APPs -  Physician Assistants and Nurse Practitioners) who all work together to provide you with the care you need, when you need it.  We recommend signing up for the patient portal called "MyChart".  Sign up information is provided on this After Visit Summary.  MyChart is used to connect with patients for Virtual Visits (Telemedicine).  Patients are able to view lab/test results, encounter notes, upcoming appointments, etc.  Non-urgent messages can be sent to your provider as well.   To learn more about what you can do with MyChart, go to [email or web address].    Your next appointment:   6 month(s)  The format for your next appointment:   In Person  Provider:   You may see [name], MD or one of the following Advanced Practice Providers on your designated Care Team:   Tereso Newcomer, PA-C    Other Instructions Your physician wants you to follow-up in: 6 months with Dr. Excell Seltzer or Tereso Newcomer, PA-C.  You will receive a reminder letter in the mail two months in advance. If  you don't receive a letter, please call our office to schedule the follow-up appointment.  Please Keep legs elevated and get compression hose.   Will send a Message to Larita Fife you have talked to her in the past about Eliquis.

## 2020-12-20 ENCOUNTER — Other Ambulatory Visit: Payer: Self-pay

## 2020-12-20 MED ORDER — FUROSEMIDE 20 MG PO TABS: 20.0000 mg | ORAL_TABLET | Freq: Every day | ORAL | 1 refills | Status: DC | PRN

## 2021-01-01 ENCOUNTER — Telehealth: Payer: Self-pay | Admitting: Cardiovascular Disease

## 2021-01-01 NOTE — Telephone Encounter (Signed)
Attempted to contact pt but phone line was busy.  Pt was recently advised to contact [employer] to start the process for pt assistance.

## 2021-01-01 NOTE — Telephone Encounter (Signed)
Pt received a letter in the mail Friday from [facility], they are needing more information from our office in regards to this patient. Please advise [facility] furher [phone]

## 2021-01-02 ENCOUNTER — Other Ambulatory Visit: Payer: Self-pay

## 2021-01-02 MED ORDER — FUROSEMIDE 20 MG PO TABS: 20.0000 mg | ORAL_TABLET | Freq: Every day | ORAL | 1 refills | Status: DC | PRN

## 2021-01-02 NOTE — Telephone Encounter (Deleted)
**Note De-Identified Grant Flores Obfuscation** The pt states that he received a letter from [facility] stating they need more information concerning his Furosemide RX. He states that he did receive #40 tablets of Furosemide 20 mg since his office visit with Tereso Newcomer, PA-c on 12/19/2020.  I checked the pts med list and noticed that a 30 day supply (#20 tablets) was sent to [facility] on 07/06

## 2021-01-02 NOTE — Telephone Encounter (Signed)
**Note De-Identified Dontell Mian Obfuscation** The pt states that he received a letter from [facility] stating they need more information concerning his Furosemide RX.   He states that he did receive #40 tablets of Furosemide 20 mg since his office visit with Tereso Newcomer, PA-c on 12/19/2020 at no charge so he is unsure why he received the letter.   I checked the pts med list and noticed that a 30 day supply (#20 tablets) was sent to [facility]) on 07/06 and as a mail pharmacy they require a 90 day refill.   I have e-scribed the pts Furosemide 20 mg #60 to [facility] with a note to the pharmacist that #60 is a 90 day supply as the pt takes PRN.   The pt is aware to contact Larita Fife at Dr Titus Mould, PA-c's office at [facility] at [phone] if he has has any issues getting his Furosemide refill in the future, He thanked me for calling him back and for my assistance.

## 2021-02-19 DIAGNOSIS — J449 Chronic obstructive pulmonary disease, unspecified: Secondary | ICD-10-CM | POA: Diagnosis not present

## 2021-02-19 DIAGNOSIS — I2510 Atherosclerotic heart disease of native coronary artery without angina pectoris: Secondary | ICD-10-CM | POA: Diagnosis not present

## 2021-02-19 DIAGNOSIS — I10 Essential (primary) hypertension: Secondary | ICD-10-CM | POA: Diagnosis not present

## 2021-02-19 DIAGNOSIS — E7800 Pure hypercholesterolemia, unspecified: Secondary | ICD-10-CM | POA: Diagnosis not present

## 2021-04-02 DIAGNOSIS — I2510 Atherosclerotic heart disease of native coronary artery without angina pectoris: Secondary | ICD-10-CM | POA: Diagnosis not present

## 2021-04-02 DIAGNOSIS — I10 Essential (primary) hypertension: Secondary | ICD-10-CM | POA: Diagnosis not present

## 2021-04-02 DIAGNOSIS — E7800 Pure hypercholesterolemia, unspecified: Secondary | ICD-10-CM | POA: Diagnosis not present

## 2021-04-02 DIAGNOSIS — J449 Chronic obstructive pulmonary disease, unspecified: Secondary | ICD-10-CM | POA: Diagnosis not present

## 2021-05-01 DIAGNOSIS — I10 Essential (primary) hypertension: Secondary | ICD-10-CM | POA: Diagnosis not present

## 2021-05-01 DIAGNOSIS — I2510 Atherosclerotic heart disease of native coronary artery without angina pectoris: Secondary | ICD-10-CM | POA: Diagnosis not present

## 2021-05-01 DIAGNOSIS — E7800 Pure hypercholesterolemia, unspecified: Secondary | ICD-10-CM | POA: Diagnosis not present

## 2021-05-01 DIAGNOSIS — J449 Chronic obstructive pulmonary disease, unspecified: Secondary | ICD-10-CM | POA: Diagnosis not present

## 2021-05-23 ENCOUNTER — Other Ambulatory Visit: Payer: Self-pay | Admitting: Physician Assistant

## 2021-05-28 DIAGNOSIS — I4892 Unspecified atrial flutter: Secondary | ICD-10-CM | POA: Diagnosis not present

## 2021-05-28 DIAGNOSIS — D6869 Other thrombophilia: Secondary | ICD-10-CM | POA: Diagnosis not present

## 2021-05-28 DIAGNOSIS — F17210 Nicotine dependence, cigarettes, uncomplicated: Secondary | ICD-10-CM | POA: Diagnosis not present

## 2021-05-28 DIAGNOSIS — J449 Chronic obstructive pulmonary disease, unspecified: Secondary | ICD-10-CM | POA: Diagnosis not present

## 2021-05-28 DIAGNOSIS — I10 Essential (primary) hypertension: Secondary | ICD-10-CM | POA: Diagnosis not present

## 2021-05-28 DIAGNOSIS — Z79899 Other long term (current) drug therapy: Secondary | ICD-10-CM | POA: Diagnosis not present

## 2021-05-29 ENCOUNTER — Other Ambulatory Visit: Payer: Self-pay | Admitting: Cardiovascular Disease

## 2021-05-29 NOTE — Telephone Encounter (Signed)
Eliquis 5mg  refill request received. Patient is 80 years old, weight-99.4kg, Crea-0.92 on 05/28/2021 via [facility] form [facility], Diagnosis-Aflutter, and last seen by 14/05/2021 on 12/19/2020. Dose is appropriate based on dosing criteria. Will send in refill to requested pharmacy.

## 2021-06-15 DIAGNOSIS — J449 Chronic obstructive pulmonary disease, unspecified: Secondary | ICD-10-CM | POA: Diagnosis not present

## 2021-06-15 DIAGNOSIS — I10 Essential (primary) hypertension: Secondary | ICD-10-CM | POA: Diagnosis not present

## 2021-06-15 DIAGNOSIS — I2510 Atherosclerotic heart disease of native coronary artery without angina pectoris: Secondary | ICD-10-CM | POA: Diagnosis not present

## 2021-06-15 DIAGNOSIS — E7800 Pure hypercholesterolemia, unspecified: Secondary | ICD-10-CM | POA: Diagnosis not present

## 2021-08-27 DIAGNOSIS — E7800 Pure hypercholesterolemia, unspecified: Secondary | ICD-10-CM | POA: Diagnosis not present

## 2021-08-27 DIAGNOSIS — I10 Essential (primary) hypertension: Secondary | ICD-10-CM | POA: Diagnosis not present

## 2021-10-24 ENCOUNTER — Ambulatory Visit: Payer: Medicare Other | Admitting: Physician Assistant

## 2021-10-24 ENCOUNTER — Encounter: Payer: Self-pay | Admitting: Physician Assistant

## 2021-10-24 VITALS — BP 110/62 | HR 79 | Ht 67.0 in | Wt 218.4 lb

## 2021-10-24 DIAGNOSIS — I2510 Atherosclerotic heart disease of native coronary artery without angina pectoris: Secondary | ICD-10-CM | POA: Diagnosis not present

## 2021-10-24 DIAGNOSIS — I5032 Chronic diastolic (congestive) heart failure: Secondary | ICD-10-CM | POA: Diagnosis not present

## 2021-10-24 DIAGNOSIS — F17200 Nicotine dependence, unspecified, uncomplicated: Secondary | ICD-10-CM

## 2021-10-24 DIAGNOSIS — I483 Typical atrial flutter: Secondary | ICD-10-CM | POA: Diagnosis not present

## 2021-10-24 DIAGNOSIS — I10 Essential (primary) hypertension: Secondary | ICD-10-CM | POA: Diagnosis not present

## 2021-10-24 DIAGNOSIS — E782 Mixed hyperlipidemia: Secondary | ICD-10-CM

## 2021-10-24 DIAGNOSIS — R0602 Shortness of breath: Secondary | ICD-10-CM | POA: Diagnosis not present

## 2021-10-24 MED ORDER — FUROSEMIDE 20 MG PO TABS: ORAL_TABLET | ORAL | 3 refills | Status: DC

## 2021-10-24 NOTE — Assessment & Plan Note (Signed)
Rate is controlled.  He fell over a month ago in his bathroom.  It sounds like he lost his balance.  He never sought medical attention.  He is not having any concerning symptoms.  I do not think he needs a head CT.  Continue Eliquis 5 mg twice daily (weight > 60 kg, SCr < 1.5).  Check BMET, CBC today.  ?

## 2021-10-24 NOTE — Patient Instructions (Addendum)
Medication Instructions:  ?Your physician has recommended you make the following change in your medication:  ? INCREASE the Lasix to 2 tablets daily for 3 days then go back down to 1 daily  ? ?*If you need a refill on your cardiac medications before your next appointment, please call your pharmacy* ? ? ?Lab Work: ?TODAY:  BMET & PRO BNP ? ?If you have labs (blood work) drawn today and your tests are completely normal, you will receive your results only by: ?MyChart Message (if you have MyChart) OR ?A paper copy in the mail ?If you have any lab test that is abnormal or we need to change your treatment, we will call you to review the results. ? ? ?Testing/Procedures: ?None ordered ? ? ?Follow-Up: ?At [facility], you and your health needs are our priority.  As part of our continuing mission to provide you with exceptional heart care, we have created designated Provider Care Teams.  These Care Teams include your primary Cardiologist (physician) and Advanced Practice Providers (APPs -  Physician Assistants and Nurse Practitioners) who all work together to provide you with the care you need, when you need it. ? ?We recommend signing up for the patient portal called "MyChart".  Sign up information is provided on this After Visit Summary.  MyChart is used to connect with patients for Virtual Visits (Telemedicine).  Patients are able to view lab/test results, encounter notes, upcoming appointments, etc.  Non-urgent messages can be sent to your provider as well.   ?To learn more about what you can do with MyChart, go to [email or web address].   ? ?Your next appointment:   ?6 month(s)  05/01/22 ARRIVE AT 2:45 ? ?The format for your next appointment:   ?In Person ? ?Provider:   ?[name], MD    ? ? ?Other Instructions ? ? ?Important Information About Sugar ? ? ? ? ?  ?

## 2021-10-24 NOTE — Assessment & Plan Note (Addendum)
Echo in 2019 with severe LVH, mild diastolic dysfunction and normal EF.  He has chronic shortness of breath that is likely more related to Chronic Obstructive Pulmonary Disease from smoking.  However, he has leg edema and rales in the bases on exam.  He has not had any significant change in his symptoms since he was last here.   ?? BMET BNP today ?? Increase Lasix to 40 mg once daily x 3 days, then 20 mg once daily ?? If BNP significantly elevated, consider getting a repeat echocardiogram  ?? F/u 6 mos ?

## 2021-10-24 NOTE — Addendum Note (Signed)
Addended byAlben Spittle, Lorin Picket T on: 10/24/2021 04:50 PM ? ? Modules accepted: Orders ? ?

## 2021-10-24 NOTE — Progress Notes (Signed)
?Cardiology Office Note:   ? ?Date:  10/24/2021  ? ?ID:  Grant Flores, DOB 13-Apr-1941, MRN [id number] ? ?PCP:  Lajean Manes, MD  ?[facility] Providers ?Cardiologist:  Sherren Mocha, MD ?Cardiology APP:  Liliane Shi, PA-C     ?Referring MD: Lajean Manes, MD  ? ?Chief Complaint:  F/u on AFlutter, CAD ?  ? ?Patient Profile: ?Coronary artery disease ?S/p inf-lat MI in 2008 >> PCI: BMS to LCx ?Atrial flutter ?CHA2DS2-VASc=4 (age x 2, CAD, HTN) >> Apixaban ?COPD ?Pulmonary nodule ?Tobacco use ?Hypertension ?Hyperlipidemia  ?+Cigs ? ?Prior CV Studies: ? ?Echocardiogram 01/14/18 ?Severe LVH, EF 55-60, no RWMA, Gr 1 DD, mod RAE ?  ?Myoview 04/15/11 ?EF 42, inf-lat scar, no ischemia ?  ?Cardiac catheterization 05/18/07 ?EF 50 ?RCA 40 ?LAD irregs; D2 and D3 ost 74 ?LCx 100, OM1 40 ?PCI:  BMS to LCx ? ? ?History of Present Illness:   ?Grant Flores is a 81 y.o. male with the above problem list.  He was last seen in July 2022.  He returns for f/u.  He is here alone.  About a month ago, he lost his balance and fell in his bathroom.  He hit his head on carpeted floor.  He did not lose consciousness.  He did not have any ecchymosis on his face. He has not had chest pain, syncope, orthopnea.  He has chronic shortness of breath without change.  He has chronic leg edema.  I gave him Lasix at his last visit to use prn.  He has been using it daily with some improvement.       ?   ?Past Medical History:  ?Diagnosis Date  ? Asthma   ? CAD (coronary artery disease) October 2008  ? s/p inferolateral STEMI.... PCI with BMS to LCX. (cath with EF 50%, mod inf HK, RCA 40%, small oD3 70%, OM1 40%, CFX occluded - tx with PCI);  echo 10/08: EF 45%, mild RVE;  Myoview 2009. Inferior infarct. no ischemia, EF 45%  ? COPD (chronic obstructive pulmonary disease) ([place])   ? with ongoing tobacco  ? High cholesterol   ? Hypertension   ? Myocardial infarction [facility]) 2008  ? Obesity   ? Shortness of breath   ? with exertion   ? ?Current  Medications: ?Current Meds  ?Medication Sig  ? amLODipine (NORVASC) 5 MG tablet TAKE 1 TABLET BY MOUTH  DAILY  ? apixaban (ELIQUIS) 5 MG TABS tablet TAKE 1 TABLET BY MOUTH  TWICE DAILY  ? atorvastatin (LIPITOR) 80 MG tablet TAKE 1 TABLET BY MOUTH  DAILY  ? carvedilol (COREG) 6.25 MG tablet TAKE 1 TABLET BY MOUTH  TWICE DAILY WITH MEALS  ? furosemide (LASIX) 20 MG tablet Take 2 tablets by mouth daily X's 3 days then go back down to 1 tablet by mouth daily  ? SPIRIVA RESPIMAT 2.5 MCG/ACT AERS Inhale into the lungs as needed.  ? [DISCONTINUED] furosemide (LASIX) 20 MG tablet TAKE 1 TABLET BY MOUTH  DAILY AS NEEDED FOR FLUID  OR EDEMA  ?  ?Allergies:   Patient has no known allergies.  ? ?Social History  ? ?Tobacco Use  ? Smoking status: Every Day  ?  Packs/day: 1.50  ?  Types: Cigarettes  ? Smokeless tobacco: Never  ?Substance Use Topics  ? Alcohol use: No  ? Drug use: No  ?  ?Family Hx: ?The patient's family history includes Heart attack (age of onset: 60) in his father. There is no history of Coronary  artery disease. ? ?Review of Systems  ?Gastrointestinal:  Negative for hematochezia.  ?Genitourinary:  Negative for hematuria.   ? ?EKGs/Labs/Other Test Reviewed:   ? ?EKG:  EKG is   ordered today.  The ekg ordered today demonstrates atrial flutter, HR 79, 3: 1 conduction, QTc 472, similar to prior tracings ? ?Recent Labs: ?No results found for requested labs within last 8760 hours.  ? ?Recent Lipid Panel ?No results for input(s): CHOL, TRIG, HDL, VLDL, LDLCALC, LDLDIRECT in the last 8760 hours.  ? ?Risk Assessment/Calculations:   ? ?CHA2DS2-VASc Score = 5  ? This indicates a 7.2% annual risk of stroke. ?The patient's score is based upon: ?CHF History: 1 ?HTN History: 1 ?Diabetes History: 0 ?Stroke History: 0 ?Vascular Disease History: 1 ?Age Score: 2 ?Gender Score: 0 ?  ? ?    ?Physical Exam:   ? ?VS:  BP 110/62   Pulse 79   Ht 5\' 7"  (1.702 m)   Wt 218 lb 6.4 oz (99.1 kg)   SpO2 94%   BMI 34.21 kg/m?    ? ?Wt  Readings from Last 3 Encounters:  ?10/24/21 218 lb 6.4 oz (99.1 kg)  ?12/19/20 219 lb 3.2 oz (99.4 kg)  ?03/22/20 215 lb (97.5 kg)  ?  ?Constitutional:   ?   Appearance: Healthy appearance. Not in distress.  ?Neck:  ?   Vascular: No JVR.  ?Pulmonary:  ?   Effort: Pulmonary effort is normal.  ?   Breath sounds: No wheezing. Bibasilar Rales present.  ?Cardiovascular:  ?   Normal rate. Regular rhythm. Normal S1. Normal S2.   ?   Murmurs: There is no murmur.  ?Edema: ?   Peripheral edema present. ?   Pretibial: bilateral 1+ edema of the pretibial area. ?Abdominal:  ?   Palpations: Abdomen is soft.  ?Skin: ?   General: Skin is warm and dry.  ?Neurological:  ?   General: No focal deficit present.  ?   Mental Status: Alert and oriented to person, place and time.  ?   Cranial Nerves: Cranial nerves are intact.  ?  ?    ?ASSESSMENT & PLAN:   ?(HFpEF) heart failure with preserved ejection fraction ([place]) ?Echo in 2019 with severe LVH, mild diastolic dysfunction and normal EF.  He has chronic shortness of breath that is likely more related to Chronic Obstructive Pulmonary Disease from smoking.  However, he has leg edema and rales in the bases on exam.  He has not had any significant change in his symptoms since he was last here.   ?BMET BNP today ?Increase Lasix to 40 mg once daily x 3 days, then 20 mg once daily ?If BNP significantly elevated, consider getting a repeat echocardiogram  ?F/u 6 mos ? ?CAD (coronary artery disease) ?S/p inf MI in 2008 tx with BMS to the LCx.  He is not having symptoms of angina.  He is not on antiplatelet Rx as he is on anticoagulation with Eliquis.  Continue Coreg 6.25 mg twice daily, Lipitor 80 mg once daily.  F/u 6 mos.  ? ?HYPERTENSION, BENIGN ?The patient's blood pressure is controlled on his current regimen.  Continue current therapy.   ? ?Mixed hyperlipidemia ?LDL in June 2022 was optimal.  Continue Lipitor 80 mg once daily.  ? ?Typical atrial flutter ([place]) ?Rate is controlled.  He fell  over a month ago in his bathroom.  It sounds like he lost his balance.  He never sought medical attention.  He is not having  any concerning symptoms.  I do not think he needs a head CT.  Continue Eliquis 5 mg twice daily (weight > 60 kg, SCr < 1.5).  Check BMET, CBC today.  ? ?TOBACCO ABUSE ?He does not plan to quit. ?  ? ?     ?   ?Dispo:  Return in about 6 months (around 04/26/2022) for Routine Follow Up, w/ Dr. Burt Knack, or Richardson Dopp, PA-C.  ? ?Medication Adjustments/Labs and Tests Ordered: ?Current medicines are reviewed at length with the patient today.  Concerns regarding medicines are outlined above.  ?Tests Ordered: ?Orders Placed This Encounter  ?Procedures  ? CBC  ? Pro b natriuretic peptide (BNP)  ? EKG 12-Lead  ? ?Medication Changes: ?Meds ordered this encounter  ?Medications  ? furosemide (LASIX) 20 MG tablet  ?  Sig: Take 2 tablets by mouth daily X's 3 days then go back down to 1 tablet by mouth daily  ?  Dispense:  96 tablet  ?  Refill:  3  ? ?Signed, ?Richardson Dopp, PA-C  ?10/24/2021 4:48 PM    ?[place] ?[place], [place], [place]  [address] ?Phone: [phone]; Fax: [phone]  ?

## 2021-10-24 NOTE — Assessment & Plan Note (Signed)
The patient's blood pressure is controlled on his current regimen.  Continue current therapy.  

## 2021-10-24 NOTE — Assessment & Plan Note (Signed)
LDL in June 2022 was optimal.  Continue Lipitor 80 mg once daily.  ?

## 2021-10-24 NOTE — Assessment & Plan Note (Signed)
S/p inf MI in 2008 tx with BMS to the LCx.  He is not having symptoms of angina.  He is not on antiplatelet Rx as he is on anticoagulation with Eliquis.  Continue Coreg 6.25 mg twice daily, Lipitor 80 mg once daily.  F/u 6 mos.  ?

## 2021-10-24 NOTE — Assessment & Plan Note (Signed)
He does not plan to quit. ?

## 2021-10-25 LAB — BASIC METABOLIC PANEL
BUN/Creatinine Ratio: 7 — ABNORMAL LOW (ref 10–24)
BUN: 7 mg/dL — ABNORMAL LOW (ref 8–27)
CO2: 20 mmol/L (ref 20–29)
Calcium: 9 mg/dL (ref 8.6–10.2)
Chloride: 104 mmol/L (ref 96–106)
Creatinine, Ser: 0.97 mg/dL (ref 0.76–1.27)
Glucose: 100 mg/dL — ABNORMAL HIGH (ref 70–99)
Potassium: 4.5 mmol/L (ref 3.5–5.2)
Sodium: 141 mmol/L (ref 134–144)
eGFR: 79 mL/min/{1.73_m2} (ref >59)

## 2021-10-25 LAB — CBC
Hematocrit: 47.1 % (ref 37.5–51.0)
Hemoglobin: 15.4 g/dL (ref 13.0–17.7)
MCH: 30.3 pg (ref 26.6–33.0)
MCHC: 32.7 g/dL (ref 31.5–35.7)
MCV: 93 fL (ref 79–97)
Platelets: 258 10*3/uL (ref 150–450)
RBC: 5.09 x10E6/uL (ref 4.14–5.80)
RDW: 13.5 % (ref 11.6–15.4)
WBC: 9.7 10*3/uL (ref 3.4–10.8)

## 2021-10-25 LAB — PRO B NATRIURETIC PEPTIDE: NT-Pro BNP: 795 pg/mL — ABNORMAL HIGH (ref 0–486)

## 2021-11-01 ENCOUNTER — Other Ambulatory Visit: Payer: Self-pay | Admitting: Cardiovascular Disease

## 2021-11-01 DIAGNOSIS — I483 Typical atrial flutter: Secondary | ICD-10-CM

## 2021-11-01 MED ORDER — APIXABAN 5 MG PO TABS: 5.0000 mg | ORAL_TABLET | Freq: Two times a day (BID) | ORAL | 3 refills | Status: DC

## 2021-11-01 NOTE — Telephone Encounter (Signed)
Eliquis 5mg  refill request received. Patient is 81 years old, weight-99.1kg, Crea-0.97 on 10/24/21, Diagnosis-Aflutter, and last seen by 12/24/21 on 10/24/21. Dose is appropriate based on dosing criteria. Will send in refill to requested pharmacy.

## 2021-12-03 DIAGNOSIS — I10 Essential (primary) hypertension: Secondary | ICD-10-CM | POA: Diagnosis not present

## 2021-12-03 DIAGNOSIS — J449 Chronic obstructive pulmonary disease, unspecified: Secondary | ICD-10-CM | POA: Diagnosis not present

## 2021-12-03 DIAGNOSIS — I2510 Atherosclerotic heart disease of native coronary artery without angina pectoris: Secondary | ICD-10-CM | POA: Diagnosis not present

## 2021-12-03 DIAGNOSIS — E7800 Pure hypercholesterolemia, unspecified: Secondary | ICD-10-CM | POA: Diagnosis not present

## 2022-01-01 ENCOUNTER — Other Ambulatory Visit: Payer: Self-pay | Admitting: Geriatric Medicine

## 2022-01-01 DIAGNOSIS — F17210 Nicotine dependence, cigarettes, uncomplicated: Secondary | ICD-10-CM | POA: Diagnosis not present

## 2022-01-01 DIAGNOSIS — I700 Atherosclerosis of aorta: Secondary | ICD-10-CM | POA: Diagnosis not present

## 2022-01-01 DIAGNOSIS — Z79899 Other long term (current) drug therapy: Secondary | ICD-10-CM | POA: Diagnosis not present

## 2022-01-01 DIAGNOSIS — J449 Chronic obstructive pulmonary disease, unspecified: Secondary | ICD-10-CM | POA: Diagnosis not present

## 2022-01-01 DIAGNOSIS — D6869 Other thrombophilia: Secondary | ICD-10-CM | POA: Diagnosis not present

## 2022-01-01 DIAGNOSIS — I10 Essential (primary) hypertension: Secondary | ICD-10-CM | POA: Diagnosis not present

## 2022-01-01 DIAGNOSIS — Z0000 Encounter for general adult medical examination without abnormal findings: Secondary | ICD-10-CM | POA: Diagnosis not present

## 2022-01-01 DIAGNOSIS — I4892 Unspecified atrial flutter: Secondary | ICD-10-CM | POA: Diagnosis not present

## 2022-01-01 DIAGNOSIS — E7800 Pure hypercholesterolemia, unspecified: Secondary | ICD-10-CM | POA: Diagnosis not present

## 2022-04-12 DIAGNOSIS — J449 Chronic obstructive pulmonary disease, unspecified: Secondary | ICD-10-CM | POA: Diagnosis not present

## 2022-04-12 DIAGNOSIS — E7800 Pure hypercholesterolemia, unspecified: Secondary | ICD-10-CM | POA: Diagnosis not present

## 2022-04-12 DIAGNOSIS — I2510 Atherosclerotic heart disease of native coronary artery without angina pectoris: Secondary | ICD-10-CM | POA: Diagnosis not present

## 2022-04-12 DIAGNOSIS — I10 Essential (primary) hypertension: Secondary | ICD-10-CM | POA: Diagnosis not present

## 2022-05-01 ENCOUNTER — Encounter: Payer: Self-pay | Admitting: Cardiovascular Disease

## 2022-05-01 ENCOUNTER — Ambulatory Visit: Payer: Medicare Other | Attending: Cardiovascular Disease | Admitting: Cardiovascular Disease

## 2022-05-01 VITALS — BP 120/78 | HR 80 | Ht 67.0 in | Wt 216.0 lb

## 2022-05-01 DIAGNOSIS — I2510 Atherosclerotic heart disease of native coronary artery without angina pectoris: Secondary | ICD-10-CM

## 2022-05-01 DIAGNOSIS — I483 Typical atrial flutter: Secondary | ICD-10-CM

## 2022-05-01 DIAGNOSIS — F17200 Nicotine dependence, unspecified, uncomplicated: Secondary | ICD-10-CM | POA: Diagnosis not present

## 2022-05-01 DIAGNOSIS — I10 Essential (primary) hypertension: Secondary | ICD-10-CM | POA: Diagnosis not present

## 2022-05-01 DIAGNOSIS — E782 Mixed hyperlipidemia: Secondary | ICD-10-CM | POA: Diagnosis not present

## 2022-05-01 NOTE — Progress Notes (Signed)
Cardiology Office Note:    Date:  05/01/2022   ID:  Grant Flores, DOB 01-03-1941, MRN [id number]  PCP:  Merlene Laughter, MD   [facility] Providers Cardiologist:  [name], MD Cardiology APP:  Kennon Rounds     Referring MD: Merlene Laughter, MD   Chief Complaint  Patient presents with   Coronary Artery Disease    History of Present Illness:    Grant Flores is a 81 y.o. male with a hx of: Coronary artery disease S/p inf-lat MI in 2008 >> PCI: BMS to LCx Atrial flutter CHA2DS2-VASc=4 (age x 2, CAD, HTN) >> Apixaban COPD Pulmonary nodule Tobacco use Hypertension Hyperlipidemia   The patient is here alone today.  He reports no problems with chest pain, chest pressure, or shortness of breath.  No orthopnea, PND, or heart palpitations.  He has chronic ankle edema which he reports is unchanged.  He does not take diuretics make much difference with this.  He continues to smoke cigarettes and has no intention in quitting.  Past Medical History:  Diagnosis Date   Asthma    CAD (coronary artery disease) October 2008   s/p inferolateral STEMI.... PCI with BMS to LCX. (cath with EF 50%, mod inf HK, RCA 40%, small oD3 70%, OM1 40%, CFX occluded - tx with PCI);  echo 10/08: EF 45%, mild RVE;  Myoview 2009. Inferior infarct. no ischemia, EF 45%   COPD (chronic obstructive pulmonary disease) (HCC)    with ongoing tobacco   High cholesterol    Hypertension    Myocardial infarction [facility]) 2008   Obesity    Shortness of breath    with exertion     Past Surgical History:  Procedure Laterality Date   ANTERIOR CERVICAL DECOMP/DISCECTOMY FUSION  before 2008   APPENDECTOMY  age 59   ARTHRODESIS     C4-C6   Bare Metal Stent     CARDIAC CATHETERIZATION  2008   cardiologist: Dr. Sharlet Salina    CARDIOVASCULAR STRESS TEST  04/03/2011   CARPAL TUNNEL RELEASE  unknown   Bilateral    TOTAL KNEE ARTHROPLASTY  05/24/2011   Procedure: TOTAL KNEE ARTHROPLASTY;  Surgeon: Thera Flake., MD;  Location: [facility] OR;  Service: Orthopedics;  Laterality: Right;  RIGHT ARTHROSCOPY TOTAL KNEE    Current Medications: Current Meds  Medication Sig   amLODipine (NORVASC) 5 MG tablet TAKE 1 TABLET BY MOUTH  DAILY   apixaban (ELIQUIS) 5 MG TABS tablet Take 1 tablet (5 mg total) by mouth 2 (two) times daily.   atorvastatin (LIPITOR) 80 MG tablet TAKE 1 TABLET BY MOUTH  DAILY   carvedilol (COREG) 6.25 MG tablet TAKE 1 TABLET BY MOUTH  TWICE DAILY WITH MEALS   furosemide (LASIX) 20 MG tablet Take 2 tablets by mouth daily X's 3 days then go back down to 1 tablet by mouth daily (Patient taking differently: Take 20 mg by mouth daily. Take 2 tablets by mouth daily X's 3 days then go back down to 1 tablet by mouth daily)   SPIRIVA RESPIMAT 2.5 MCG/ACT AERS Inhale into the lungs as needed.     Allergies:   Patient has no known allergies.   Social History   Socioeconomic History   Marital status: Married    Spouse name: Not on file   Number of children: Not on file   Years of education: Not on file   Highest education level: Not on file  Occupational History  Not on file  Tobacco Use   Smoking status: Every Day    Packs/day: 1.50    Types: Cigarettes   Smokeless tobacco: Never  Substance and Sexual Activity   Alcohol use: No   Drug use: No   Sexual activity: Not on file  Other Topics Concern   Not on file  Social History Narrative   Not on file   Social Determinants of Health   Financial Resource Strain: Not on file  Food Insecurity: Not on file  Transportation Needs: Not on file  Physical Activity: Not on file  Stress: Not on file  Social Connections: Not on file     Family History: The patient's family history includes Heart attack (age of onset: 67) in his father. There is no history of Coronary artery disease.  ROS:   Please see the history of present illness.    All other systems reviewed and are negative.  EKGs/Labs/Other Studies Reviewed:    EKG:   EKG is not ordered today.    Recent Labs: 10/24/2021: BUN 7; Creatinine, Ser 0.97; Hemoglobin 15.4; NT-Pro BNP 795; Platelets 258; Potassium 4.5; Sodium 141  Recent Lipid Panel    Component Value Date/Time   CHOL  08/14/2007 0340    98        ATP III CLASSIFICATION:  <200     mg/dL   Desirable  854-627  mg/dL   Borderline High  >=035    mg/dL   High   TRIG 60 00/93/8182 0340   HDL 36 (L) 08/14/2007 0340   CHOLHDL 2.7 08/14/2007 0340   VLDL 12 08/14/2007 0340   LDLCALC  08/14/2007 0340    50        Total Cholesterol/HDL:CHD Risk Coronary Heart Disease Risk Table                     Men   Women  1/2 Average Risk   3.4   3.3     Risk Assessment/Calculations:    CHA2DS2-VASc Score = 5   This indicates a 7.2% annual risk of stroke. The patient's score is based upon: CHF History: 1 HTN History: 1 Diabetes History: 0 Stroke History: 0 Vascular Disease History: 1 Age Score: 2 Gender Score: 0               Physical Exam:    VS:  BP 120/78   Pulse 80   Ht 5\' 7"  (1.702 m)   Wt 216 lb (98 kg)   BMI 33.83 kg/m     Wt Readings from Last 3 Encounters:  05/01/22 216 lb (98 kg)  10/24/21 218 lb 6.4 oz (99.1 kg)  12/19/20 219 lb 3.2 oz (99.4 kg)     GEN:  Well nourished, well developed in no acute distress HEENT: Normal NECK: No JVD; No carotid bruits LYMPHATICS: No lymphadenopathy CARDIAC: irregularly irregular, no murmurs, rubs, gallops RESPIRATORY:  Clear to auscultation without rales, wheezing or rhonchi  ABDOMEN: Soft, non-tender, non-distended MUSCULOSKELETAL: 2+ bilateral ankle edema; No deformity  SKIN: Warm and dry NEUROLOGIC:  Alert and oriented x 3 PSYCHIATRIC:  Normal affect   ASSESSMENT:    1. TOBACCO ABUSE   2. Typical atrial flutter (HCC)   3. Coronary artery disease involving native coronary artery of native heart without angina pectoris   4. HYPERTENSION, BENIGN   5. Mixed hyperlipidemia    PLAN:    In order of problems listed  above:  No intention to quit.  We discussed  this today. Rate controlled with carvedilol, anticoagulated with apixaban.  No bleeding or bruising problems reported.  Tolerating well. Patient is stable without symptoms of angina.  No antiplatelet therapy in the context of chronic oral anticoagulation.  His PCI procedure was remote in 2008.  Secondary risk reduction measures as outlined.  Blood pressure is well controlled on a combination of carvedilol and amlodipine. Treated with atorvastatin 80 mg daily.  Cholesterol is 99, LDL 40.            Medication Adjustments/Labs and Tests Ordered: Current medicines are reviewed at length with the patient today.  Concerns regarding medicines are outlined above.  No orders of the defined types were placed in this encounter.  No orders of the defined types were placed in this encounter.   Patient Instructions  Medication Instructions:  Your physician recommends that you continue on your current medications as directed. Please refer to the Current Medication list given to you today.  *If you need a refill on your cardiac medications before your next appointment, please call your pharmacy*   Lab Work: NONE If you have labs (blood work) drawn today and your tests are completely normal, you will receive your results only by: MyChart Message (if you have MyChart) OR A paper copy in the mail If you have any lab test that is abnormal or we need to change your treatment, we will call you to review the results.   Testing/Procedures: NONE   Follow-Up: At [facility], you and your health needs are our priority.  As part of our continuing mission to provide you with exceptional heart care, we have created designated Provider Care Teams.  These Care Teams include your primary Cardiologist (physician) and Advanced Practice Providers (APPs -  Physician Assistants and Nurse Practitioners) who all work together to provide you with the care you  need, when you need it.  We recommend signing up for the patient portal called "MyChart".  Sign up information is provided on this After Visit Summary.  MyChart is used to connect with patients for Virtual Visits (Telemedicine).  Patients are able to view lab/test results, encounter notes, upcoming appointments, etc.  Non-urgent messages can be sent to your provider as well.   To learn more about what you can do with MyChart, go to [email or web address].    Your next appointment:   6 month(s)  The format for your next appointment:   In Person  Provider:   Jari Favre, PA-C, Ronie Spies, PA-C, Robin Searing, NP, Eligha Bridegroom, NP, or Tereso Newcomer, PA-C     Then, [name], MD will plan to see you again in 1 year(s).      Important Information About Sugar         Signed, [name], MD  05/01/2022 5:27 PM    [facility]

## 2022-05-01 NOTE — Patient Instructions (Signed)
Medication Instructions:  Your physician recommends that you continue on your current medications as directed. Please refer to the Current Medication list given to you today.  *If you need a refill on your cardiac medications before your next appointment, please call your pharmacy*   Lab Work: NONE If you have labs (blood work) drawn today and your tests are completely normal, you will receive your results only by: MyChart Message (if you have MyChart) OR A paper copy in the mail If you have any lab test that is abnormal or we need to change your treatment, we will call you to review the results.   Testing/Procedures: NONE   Follow-Up: At [facility], you and your health needs are our priority.  As part of our continuing mission to provide you with exceptional heart care, we have created designated Provider Care Teams.  These Care Teams include your primary Cardiologist (physician) and Advanced Practice Providers (APPs -  Physician Assistants and Nurse Practitioners) who all work together to provide you with the care you need, when you need it.  We recommend signing up for the patient portal called "MyChart".  Sign up information is provided on this After Visit Summary.  MyChart is used to connect with patients for Virtual Visits (Telemedicine).  Patients are able to view lab/test results, encounter notes, upcoming appointments, etc.  Non-urgent messages can be sent to your provider as well.   To learn more about what you can do with MyChart, go to [email or web address].    Your next appointment:   6 month(s)  The format for your next appointment:   In Person  Provider:   Tessa Conte, PA-C, [name], PA-C, Ernest Dick, NP, Michelle Swinyer, NP, or Scott Weaver, PA-C     Then, Michael Cooper, MD will plan to see you again in 1 year(s).      Important Information About Sugar       

## 2022-06-18 DIAGNOSIS — I252 Old myocardial infarction: Secondary | ICD-10-CM | POA: Diagnosis not present

## 2022-06-18 DIAGNOSIS — F17200 Nicotine dependence, unspecified, uncomplicated: Secondary | ICD-10-CM | POA: Diagnosis not present

## 2022-06-18 DIAGNOSIS — R490 Dysphonia: Secondary | ICD-10-CM | POA: Diagnosis not present

## 2022-06-18 DIAGNOSIS — M48061 Spinal stenosis, lumbar region without neurogenic claudication: Secondary | ICD-10-CM | POA: Diagnosis not present

## 2022-06-18 DIAGNOSIS — I4892 Unspecified atrial flutter: Secondary | ICD-10-CM | POA: Diagnosis not present

## 2022-06-18 DIAGNOSIS — I10 Essential (primary) hypertension: Secondary | ICD-10-CM | POA: Diagnosis not present

## 2022-06-18 DIAGNOSIS — E7800 Pure hypercholesterolemia, unspecified: Secondary | ICD-10-CM | POA: Diagnosis not present

## 2022-06-18 DIAGNOSIS — Z7409 Other reduced mobility: Secondary | ICD-10-CM | POA: Diagnosis not present

## 2022-06-18 DIAGNOSIS — J449 Chronic obstructive pulmonary disease, unspecified: Secondary | ICD-10-CM | POA: Diagnosis not present

## 2022-09-11 ENCOUNTER — Other Ambulatory Visit: Payer: Self-pay | Admitting: Cardiovascular Disease

## 2022-09-11 ENCOUNTER — Other Ambulatory Visit: Payer: Self-pay | Admitting: Physician Assistant

## 2022-09-11 DIAGNOSIS — I483 Typical atrial flutter: Secondary | ICD-10-CM

## 2022-09-11 NOTE — Telephone Encounter (Signed)
Prescription refill request for Eliquis received. Indication: Aflutter Last office visit: 05/01/22 Burt Knack)  Scr: 0.97 (10/24/21)  Age: 82 Weight: 98kg  Appropriate dose. Refill sent.

## 2022-11-14 DIAGNOSIS — H40053 Ocular hypertension, bilateral: Secondary | ICD-10-CM | POA: Diagnosis not present

## 2023-01-29 DIAGNOSIS — F17200 Nicotine dependence, unspecified, uncomplicated: Secondary | ICD-10-CM | POA: Diagnosis not present

## 2023-01-29 DIAGNOSIS — Z0000 Encounter for general adult medical examination without abnormal findings: Secondary | ICD-10-CM | POA: Diagnosis not present

## 2023-01-29 DIAGNOSIS — Z23 Encounter for immunization: Secondary | ICD-10-CM | POA: Diagnosis not present

## 2023-01-29 DIAGNOSIS — Z79899 Other long term (current) drug therapy: Secondary | ICD-10-CM | POA: Diagnosis not present

## 2023-01-29 DIAGNOSIS — D6869 Other thrombophilia: Secondary | ICD-10-CM | POA: Diagnosis not present

## 2023-01-29 DIAGNOSIS — I10 Essential (primary) hypertension: Secondary | ICD-10-CM | POA: Diagnosis not present

## 2023-01-29 DIAGNOSIS — E7800 Pure hypercholesterolemia, unspecified: Secondary | ICD-10-CM | POA: Diagnosis not present

## 2023-01-29 DIAGNOSIS — I700 Atherosclerosis of aorta: Secondary | ICD-10-CM | POA: Diagnosis not present

## 2023-01-29 DIAGNOSIS — M48061 Spinal stenosis, lumbar region without neurogenic claudication: Secondary | ICD-10-CM | POA: Diagnosis not present

## 2023-01-29 DIAGNOSIS — I4892 Unspecified atrial flutter: Secondary | ICD-10-CM | POA: Diagnosis not present

## 2023-01-29 DIAGNOSIS — J449 Chronic obstructive pulmonary disease, unspecified: Secondary | ICD-10-CM | POA: Diagnosis not present

## 2023-03-24 DIAGNOSIS — Z23 Encounter for immunization: Secondary | ICD-10-CM | POA: Diagnosis not present

## 2023-03-24 DIAGNOSIS — R600 Localized edema: Secondary | ICD-10-CM | POA: Diagnosis not present

## 2023-04-16 DIAGNOSIS — R600 Localized edema: Secondary | ICD-10-CM | POA: Diagnosis not present

## 2023-05-01 NOTE — Progress Notes (Signed)
Cardiology Office Note:    Date:  05/02/2023   ID:  BRONCO STEM, DOB 03-29-1941, MRN [id number]  PCP:  Thana Ates, MD   [facility] Providers Cardiologist:  [name], MD Cardiology APP:  Kennon Rounds     Referring MD: No ref. provider found   Chief Complaint  Patient presents with   Coronary artery disease involving native coronary artery of   Follow-up    1 year    History of Present Illness:    Grant Flores is a 82 y.o. male with a hx of:  Coronary artery disease S/p inf-lat MI in 2008 >> PCI: BMS to LCx Atrial flutter CHA2DS2-VASc=4 (age x 2, CAD, HTN) >> Apixaban COPD Pulmonary nodule Tobacco use Hypertension Hyperlipidemia  The patient is here with his wife today.  He has been having a difficult time with much more swelling in his legs.  He avoids salt but does not really comply with leg elevation or compression.  He denies chest pain or pressure.  His shortness of breath is at baseline.  He has been a longtime smoker, not interested in quitting.  He had increased his furosemide to 40 mg daily but did not appreciate any difference in it.  He has not taken furosemide in about 5 days now since running out.  He continues on apixaban and denies any bleeding problems.  He has had no orthopnea or PND.  Current Medications: Current Meds  Medication Sig   atorvastatin (LIPITOR) 80 MG tablet TAKE 1 TABLET BY MOUTH ONCE  DAILY   carvedilol (COREG) 6.25 MG tablet TAKE 1 TABLET BY MOUTH TWICE  DAILY WITH MEALS   ELIQUIS 5 MG TABS tablet TAKE 1 TABLET BY MOUTH TWICE  DAILY   potassium chloride (KLOR-CON) 10 MEQ tablet Take 1 tablet (10 mEq total) by mouth daily.   SPIRIVA RESPIMAT 2.5 MCG/ACT AERS Inhale into the lungs as needed.   torsemide (DEMADEX) 20 MG tablet Take 2 tablets (40 mg total) by mouth daily.   torsemide (DEMADEX) 20 MG tablet Take 2 tablets (40 mg total) by mouth daily.   [DISCONTINUED] amLODipine (NORVASC) 5 MG tablet TAKE 1  TABLET BY MOUTH DAILY   [DISCONTINUED] furosemide (LASIX) 40 MG tablet Take 40 mg by mouth daily. Has not taken medication for about 5-6 days   [DISCONTINUED] potassium chloride (KLOR-CON) 10 MEQ tablet Take 1 tablet (10 mEq total) by mouth daily.     Allergies:   Patient has no allergy information on record.   ROS:   Please see the history of present illness.    All other systems reviewed and are negative.  EKGs/Labs/Other Studies Reviewed:    The following studies were reviewed today: Cardiac Studies & Procedures       ECHOCARDIOGRAM  ECHOCARDIOGRAM COMPLETE 01/14/2018  Narrative *[facility]* [address] [place], [place] [address] [phone]  ------------------------------------------------------------------- Transthoracic Echocardiography  Patient:    Grant, Flores MR #:       [id number] Study Date: 01/14/2018 Gender:     M Age:        77 Height:     170.2 cm Weight:     101.7 kg BSA:        2.23 m^2 Pt. Status: Room:  ATTENDING    Kristeen Miss, M.D. SONOGRAPHER  Cathie Beams ORDERING     [name], MD REFERRING    [name], MD PERFORMING   [facility], Outpatient  cc:  -------------------------------------------------------------------  LV EF: 55% -   60%  ------------------------------------------------------------------- Indications:      R06.02 Shortness of breath.  ------------------------------------------------------------------- History:   PMH:  Asthma. Obesity.  Coronary artery disease. Chronic obstructive pulmonary disease.  PMH:   Myocardial infarction.  Risk factors:  Hypertension. Dyslipidemia.  ------------------------------------------------------------------- Study Conclusions  - Left ventricle: The cavity size was normal. Wall thickness was increased in a pattern of severe LVH. There was focal basal hypertrophy. Systolic function was normal. The estimated ejection fraction was in the range of 55% to 60%. Wall  motion was normal; there were no regional wall motion abnormalities. Doppler parameters are consistent with abnormal left ventricular relaxation (grade 1 diastolic dysfunction). - Right atrium: The atrium was moderately dilated.  ------------------------------------------------------------------- Study data:  No prior study was available for comparison.  Study status:  Routine.  Procedure:  The patient reported no pain pre or post test. Transthoracic echocardiography. Image quality was adequate.  Study completion:  There were no complications. Transthoracic echocardiography.  M-mode, complete 2D, spectral Doppler, and color Doppler.  Birthdate:  Patient birthdate: January 29, 1941.  Age:  Patient is 82 yr old.  Sex:  Gender: male. BMI: 35.1 kg/m^2.  Blood pressure:     138/83  Patient status: Outpatient.  Study date:  Study date: 01/14/2018. Study time: 10:16 AM.  Location:  [facility]  -------------------------------------------------------------------  ------------------------------------------------------------------- Left ventricle:  The cavity size was normal. Wall thickness was increased in a pattern of severe LVH. There was focal basal hypertrophy. Systolic function was normal. The estimated ejection fraction was in the range of 55% to 60%. Wall motion was normal; there were no regional wall motion abnormalities. Doppler parameters are consistent with abnormal left ventricular relaxation (grade 1 diastolic dysfunction).  ------------------------------------------------------------------- Aortic valve:   Structurally normal valve.   Cusp separation was normal.  Doppler:  Transvalvular velocity was within the normal range. There was no stenosis. There was no regurgitation.  ------------------------------------------------------------------- Aorta:  Aortic root: The aortic root was normal in size. Ascending aorta: The ascending aorta was normal in  size.  ------------------------------------------------------------------- Mitral valve:   Structurally normal valve.   Leaflet separation was normal.  Doppler:  Transvalvular velocity was within the normal range. There was no evidence for stenosis. There was trivial regurgitation.  ------------------------------------------------------------------- Left atrium:  The atrium was normal in size.  ------------------------------------------------------------------- Right ventricle:  The cavity size was normal. Systolic function was normal.  ------------------------------------------------------------------- Pulmonic valve:    The valve appears to be grossly normal. Doppler:  There was no significant regurgitation.  ------------------------------------------------------------------- Tricuspid valve:   The valve appears to be grossly normal. Doppler:  There was no significant regurgitation.  ------------------------------------------------------------------- Right atrium:  The atrium was moderately dilated.  ------------------------------------------------------------------- Pericardium:  There was no pericardial effusion.  ------------------------------------------------------------------- Measurements  Left ventricle                           Value        Reference LV ID, ED, PLAX chordal                  47.9  mm     43 - 52 LV ID, ES, PLAX chordal        (H)       40.7  mm     23 - 38 LV fx shortening, PLAX chordal (L)       15    %      >=29 LV  PW thickness, ED                      16.8  mm     --------- IVS/LV PW ratio, ED                      1.08         <=1.3 LV e&', lateral                           7.79  cm/s   --------- LV E/e&', lateral                         8.87         --------- LV e&', medial                            4.61  cm/s   --------- LV E/e&', medial                          14.99        --------- LV e&', average                           6.2   cm/s    --------- LV E/e&', average                         11.15        ---------  Ventricular septum                       Value        Reference IVS thickness, ED                        18.1  mm     ---------  LVOT                                     Value        Reference LVOT ID, S                               20    mm     --------- LVOT area                                3.14  cm^2   ---------  Aorta                                    Value        Reference Aortic root ID, ED                       37    mm     ---------  Left atrium                              Value  Reference LA ID, A-P, ES                           41    mm     --------- LA ID/bsa, A-P                           1.84  cm/m^2 <=2.2 LA volume, S                             74    ml     --------- LA volume/bsa, S                         33.1  ml/m^2 --------- LA volume, ES, 1-p A4C                   85    ml     --------- LA volume/bsa, ES, 1-p A4C               38.1  ml/m^2 --------- LA volume, ES, 1-p A2C                   61    ml     --------- LA volume/bsa, ES, 1-p A2C               27.3  ml/m^2 ---------  Mitral valve                             Value        Reference Mitral E-wave peak velocity              69.1  cm/s   --------- Mitral A-wave peak velocity              105   cm/s   --------- Mitral deceleration time       (H)       310   ms     150 - 230 Mitral E/A ratio, peak                   0.7          ---------  Right ventricle                          Value        Reference RV s&', lateral, S                        13.6  cm/s   ---------  Legend: (L)  and  (H)  mark values outside specified reference range.  ------------------------------------------------------------------- Prepared and Electronically Authenticated by  Kristeen Miss, M.D. 2019-07-31T14:55:52             EKG:   EKG Interpretation Date/Time:  Friday May 02 2023 09:04:13 EST Ventricular Rate:  75 PR  Interval:  294 QRS Duration:  104 QT Interval:  418 QTC Calculation: 466 R Axis:   -8  Text Interpretation: atrial flutter with 4:1 conduction compared to EKG 05/25/2011, atrial flutter replaces sinus rhythm Confirmed by [name] [phone]) on 05/02/2023 9:25:38 AM    Recent Labs: No results found for requested labs within last 365 days.  Recent Lipid Panel    Component  Value Date/Time   CHOL  08/14/2007 0340    98        ATP III CLASSIFICATION:  <200     mg/dL   Desirable  161-096  mg/dL   Borderline High  >=045    mg/dL   High   TRIG 60 40/98/1191 0340   HDL 36 (L) 08/14/2007 0340   CHOLHDL 2.7 08/14/2007 0340   VLDL 12 08/14/2007 0340   LDLCALC  08/14/2007 0340    50        Total Cholesterol/HDL:CHD Risk Coronary Heart Disease Risk Table                     Men   Women  1/2 Average Risk   3.4   3.3     Risk Assessment/Calculations:    CHA2DS2-VASc Score =     This indicates a  % annual risk of stroke. The patient's score is based upon:                Physical Exam:    VS:  BP 120/72 (BP Location: Left Arm, Patient Position: Sitting, Cuff Size: Large)   Pulse 77   Resp 16   Ht 5\' 7"  (1.702 m)   Wt 216 lb 6.4 oz (98.2 kg)   SpO2 97%   BMI 33.89 kg/m     Wt Readings from Last 3 Encounters:  05/02/23 216 lb 6.4 oz (98.2 kg)  05/01/22 216 lb (98 kg)  10/24/21 218 lb 6.4 oz (99.1 kg)     GEN: Elderly chronically ill-appearing male in no acute distress HEENT: Normal NECK: No JVD; No carotid bruits LYMPHATICS: No lymphadenopathy CARDIAC: RRR, no murmurs, rubs, gallops RESPIRATORY:  Clear to auscultation without rales, wheezing or rhonchi  ABDOMEN: Soft, non-tender, non-distended MUSCULOSKELETAL: 3+ bilateral pretibial and ankle edema; No deformity  SKIN: Warm and dry NEUROLOGIC:  Alert and oriented x 3 PSYCHIATRIC:  Normal affect   Assessment & Plan Typical atrial flutter (HCC) Continue rate control and anticoagulation.  Longstanding, generally  asymptomatic and well-tolerated. Coronary artery disease involving native coronary artery of native heart without angina pectoris No anginal symptoms.  Continue current management. Mixed hyperlipidemia Treated with atorvastatin 80 mg daily.  LDL cholesterol is 34. Chronic heart failure with preserved ejection fraction [facility]) The patient has fairly marked edema.  He has not responded well to furosemide.  Will try him on an increased dose of a more bioavailable loop diuretic with torsemide 40 mg daily and K-Dur 10 mill equivalents daily.  Advised him on sodium restriction and leg elevation.  Will have him return in 2 weeks for a metabolic panel and BNP.  Will order an echocardiogram to reassess his cardiac function.  His last echo study was in 2019 and he had normal LVEF at that time. TOBACCO ABUSE Not ready to quit. Bilateral lower extremity edema See above.  Given information on the lounge Dr. Device and discussed strategies at reducing his edema. Medication management Stop amlodipine, stop furosemide.  Start torsemide 40 mg daily.  Start potassium chloride 10 mill equivalents daily.  Repeat labs in 2 weeks.            Medication Adjustments/Labs and Tests Ordered: Current medicines are reviewed at length with the patient today.  Concerns regarding medicines are outlined above.  Orders Placed This Encounter  Procedures   Basic metabolic panel   Pro b natriuretic peptide (BNP)   EKG 12-Lead   ECHOCARDIOGRAM COMPLETE   Meds ordered  this encounter  Medications   torsemide (DEMADEX) 20 MG tablet    Sig: Take 2 tablets (40 mg total) by mouth daily.    Dispense:  180 tablet    Refill:  3    Stopping lasix and amlodipine   potassium chloride (KLOR-CON) 10 MEQ tablet    Sig: Take 1 tablet (10 mEq total) by mouth daily.    Dispense:  90 tablet    Refill:  3   torsemide (DEMADEX) 20 MG tablet    Sig: Take 2 tablets (40 mg total) by mouth daily.    Dispense:  60 tablet    Refill:  1     Replacing furosemide   DISCONTD: potassium chloride (KLOR-CON) 10 MEQ tablet    Sig: Take 1 tablet (10 mEq total) by mouth daily.    Dispense:  30 tablet    Refill:  1    Patient Instructions  Medication Instructions:  STOP Amlodipine STOP Furosemide START Potassium Chloride daily START Torsemide 40mg  daily *If you need a refill on your cardiac medications before your next appointment, please call your pharmacy*  Lab Work: BMET, BNP in 2 weeks If you have labs (blood work) drawn today and your tests are completely normal, you will receive your results only by: MyChart Message (if you have MyChart) OR A paper copy in the mail If you have any lab test that is abnormal or we need to change your treatment, we will call you to review the results.  Testing/Procedures: ECHO Your physician has requested that you have an echocardiogram. Echocardiography is a painless test that uses sound waves to create images of your heart. It provides your doctor with information about the size and shape of your heart and how well your heart's chambers and valves are working. This procedure takes approximately one hour. There are no restrictions for this procedure. Please do NOT wear cologne, perfume, aftershave, or lotions (deodorant is allowed). Please arrive 15 minutes prior to your appointment time.  Please note: We ask at that you not bring children with you during ultrasound (echo/ vascular) testing. Due to room size and safety concerns, children are not allowed in the ultrasound rooms during exams. Our front office staff cannot provide observation of children in our lobby area while testing is being conducted. An adult accompanying a patient to their appointment will only be allowed in the ultrasound room at the discretion of the ultrasound technician under special circumstances. We apologize for any inconvenience.  Follow-Up: At [facility], you and your health needs are our priority.   As part of our continuing mission to provide you with exceptional heart care, we have created designated Provider Care Teams.  These Care Teams include your primary Cardiologist (physician) and Advanced Practice Providers (APPs -  Physician Assistants and Nurse Practitioners) who all work together to provide you with the care you need, when you need it.  We recommend signing up for the patient portal called "MyChart".  Sign up information is provided on this After Visit Summary.  MyChart is used to connect with patients for Virtual Visits (Telemedicine).  Patients are able to view lab/test results, encounter notes, upcoming appointments, etc.  Non-urgent messages can be sent to your provider as well.   To learn more about what you can do with MyChart, go to [email or web address].    Your next appointment:   4 week(s)  Provider:   APP     Other Instructions  For your  leg edema you  should do  the following 1. Leg elevation - I recommend the Lounge Dr. Leg rest.  See below for details  2. Salt restriction  -  Use potassium chloride instead of regular salt as a salt substitute. 3. Walk regularly 4. Compression hose - Medical Supply store  5. Weight loss    Available on [email or web address] Or  Go to [email or web address]       Signed, [name], MD  05/02/2023 2:16 PM    [facility]

## 2023-05-02 ENCOUNTER — Ambulatory Visit: Payer: Medicare Other | Attending: Cardiovascular Disease | Admitting: Cardiovascular Disease

## 2023-05-02 ENCOUNTER — Telehealth: Payer: Self-pay | Admitting: Cardiovascular Disease

## 2023-05-02 ENCOUNTER — Encounter: Payer: Self-pay | Admitting: Cardiovascular Disease

## 2023-05-02 VITALS — BP 120/72 | HR 77 | Resp 16 | Ht 67.0 in | Wt 216.4 lb

## 2023-05-02 DIAGNOSIS — I2510 Atherosclerotic heart disease of native coronary artery without angina pectoris: Secondary | ICD-10-CM

## 2023-05-02 DIAGNOSIS — E782 Mixed hyperlipidemia: Secondary | ICD-10-CM | POA: Diagnosis not present

## 2023-05-02 DIAGNOSIS — Z79899 Other long term (current) drug therapy: Secondary | ICD-10-CM

## 2023-05-02 DIAGNOSIS — I5032 Chronic diastolic (congestive) heart failure: Secondary | ICD-10-CM

## 2023-05-02 DIAGNOSIS — R600 Localized edema: Secondary | ICD-10-CM | POA: Diagnosis not present

## 2023-05-02 DIAGNOSIS — F17200 Nicotine dependence, unspecified, uncomplicated: Secondary | ICD-10-CM | POA: Diagnosis not present

## 2023-05-02 DIAGNOSIS — I483 Typical atrial flutter: Secondary | ICD-10-CM

## 2023-05-02 MED ORDER — TORSEMIDE 20 MG PO TABS: 40.0000 mg | ORAL_TABLET | Freq: Every day | ORAL | 3 refills | Status: DC

## 2023-05-02 MED ORDER — TORSEMIDE 20 MG PO TABS: 40.0000 mg | ORAL_TABLET | Freq: Every day | ORAL | 1 refills | Status: DC

## 2023-05-02 MED ORDER — POTASSIUM CHLORIDE ER 10 MEQ PO TBCR: 10.0000 meq | EXTENDED_RELEASE_TABLET | Freq: Every day | ORAL | 1 refills | Status: DC

## 2023-05-02 MED ORDER — POTASSIUM CHLORIDE ER 10 MEQ PO TBCR: 10.0000 meq | EXTENDED_RELEASE_TABLET | Freq: Every day | ORAL | 3 refills | Status: DC

## 2023-05-02 NOTE — Telephone Encounter (Signed)
Additionally, patient wants a call back to confirm prescriptions will be sent today to [id number] - [place], [place] - [address] AT [facility] OF [address],

## 2023-05-02 NOTE — Assessment & Plan Note (Signed)
Continue rate control and anticoagulation.  Longstanding, generally asymptomatic and well-tolerated.

## 2023-05-02 NOTE — Telephone Encounter (Signed)
Pt asked that medication be sent to [facility] instead of [facility].     potassium chloride (KLOR-CON) 10 MEQ tablet Take 1 tablet (10 mEq total) by mouth daily.   [id number] [place], [place] - [address] AT [facility] OF [address] [facility] [address] Phone: [phone]  Fax: [phone]

## 2023-05-02 NOTE — Assessment & Plan Note (Signed)
Treated with atorvastatin 80 mg daily.  LDL cholesterol is 34.

## 2023-05-02 NOTE — Telephone Encounter (Signed)
Pt's medication was resent to pt's preferred pharmacy as requested. Confirmation received.  °

## 2023-05-02 NOTE — Patient Instructions (Signed)
Medication Instructions:  STOP Amlodipine STOP Furosemide START Potassium Chloride daily START Torsemide 40mg  daily *If you need a refill on your cardiac medications before your next appointment, please call your pharmacy*  Lab Work: BMET, BNP in 2 weeks If you have labs (blood work) drawn today and your tests are completely normal, you will receive your results only by: MyChart Message (if you have MyChart) OR A paper copy in the mail If you have any lab test that is abnormal or we need to change your treatment, we will call you to review the results.  Testing/Procedures: ECHO Your physician has requested that you have an echocardiogram. Echocardiography is a painless test that uses sound waves to create images of your heart. It provides your doctor with information about the size and shape of your heart and how well your heart's chambers and valves are working. This procedure takes approximately one hour. There are no restrictions for this procedure. Please do NOT wear cologne, perfume, aftershave, or lotions (deodorant is allowed). Please arrive 15 minutes prior to your appointment time.  Please note: We ask at that you not bring children with you during ultrasound (echo/ vascular) testing. Due to room size and safety concerns, children are not allowed in the ultrasound rooms during exams. Our front office staff cannot provide observation of children in our lobby area while testing is being conducted. An adult accompanying a patient to their appointment will only be allowed in the ultrasound room at the discretion of the ultrasound technician under special circumstances. We apologize for any inconvenience.  Follow-Up: At [facility], you and your health needs are our priority.  As part of our continuing mission to provide you with exceptional heart care, we have created designated Provider Care Teams.  These Care Teams include your primary Cardiologist (physician) and  Advanced Practice Providers (APPs -  Physician Assistants and Nurse Practitioners) who all work together to provide you with the care you need, when you need it.  We recommend signing up for the patient portal called "MyChart".  Sign up information is provided on this After Visit Summary.  MyChart is used to connect with patients for Virtual Visits (Telemedicine).  Patients are able to view lab/test results, encounter notes, upcoming appointments, etc.  Non-urgent messages can be sent to your provider as well.   To learn more about what you can do with MyChart, go to [email or web address].    Your next appointment:   4 week(s)  Provider:   APP     Other Instructions  For your  leg edema you  should do  the following 1. Leg elevation - I recommend the Lounge Dr. Leg rest.  See below for details  2. Salt restriction  -  Use potassium chloride instead of regular salt as a salt substitute. 3. Walk regularly 4. Compression hose - Medical Supply store  5. Weight loss    Available on [email or web address] Or  Go to [email or web address]

## 2023-05-02 NOTE — Telephone Encounter (Signed)
Pt wanted his medication potassium sent to a different local pharmacy. Rx sent. Confirmation received.

## 2023-05-02 NOTE — Assessment & Plan Note (Signed)
Not ready to quit.  

## 2023-05-02 NOTE — Assessment & Plan Note (Signed)
No anginal symptoms.  Continue current management.

## 2023-05-02 NOTE — Assessment & Plan Note (Signed)
The patient has fairly marked edema.  He has not responded well to furosemide.  Will try him on an increased dose of a more bioavailable loop diuretic with torsemide 40 mg daily and K-Dur 10 mill equivalents daily.  Advised him on sodium restriction and leg elevation.  Will have him return in 2 weeks for a metabolic panel and BNP.  Will order an echocardiogram to reassess his cardiac function.  His last echo study was in 2019 and he had normal LVEF at that time.

## 2023-05-02 NOTE — Telephone Encounter (Signed)
*  STAT* If patient is at the pharmacy, call can be transferred to refill team.   1. Which medications need to be refilled? (please list name of each medication and dose if known)   torsemide (DEMADEX) 20 MG tablet   2. Would you like to learn more about the convenience, safety, & potential cost savings by using the [facility]?   3. Are you open to using the [facility] (Type [facility]. ).  4. Which pharmacy/location (including street and city if local pharmacy) is medication to be sent to?  [id number] - [place], [place] - [address] AT [facility] OF [address]   5. Do they need a 30 day or 90 day supply?   30 day  Patient requested this medication be re-sent to the [id number] - [place], [place] - [address] AT [facility] OF [address] [facility] [address] as patient has not started on this medication as yet.

## 2023-05-19 DIAGNOSIS — I2510 Atherosclerotic heart disease of native coronary artery without angina pectoris: Secondary | ICD-10-CM | POA: Diagnosis not present

## 2023-05-19 DIAGNOSIS — E782 Mixed hyperlipidemia: Secondary | ICD-10-CM | POA: Diagnosis not present

## 2023-05-19 DIAGNOSIS — F17200 Nicotine dependence, unspecified, uncomplicated: Secondary | ICD-10-CM | POA: Diagnosis not present

## 2023-05-19 DIAGNOSIS — I5032 Chronic diastolic (congestive) heart failure: Secondary | ICD-10-CM | POA: Diagnosis not present

## 2023-05-19 DIAGNOSIS — I483 Typical atrial flutter: Secondary | ICD-10-CM | POA: Diagnosis not present

## 2023-05-20 LAB — BASIC METABOLIC PANEL
BUN/Creatinine Ratio: 12 (ref 10–24)
BUN: 11 mg/dL (ref 8–27)
CO2: 26 mmol/L (ref 20–29)
Calcium: 8.9 mg/dL (ref 8.6–10.2)
Chloride: 98 mmol/L (ref 96–106)
Creatinine, Ser: 0.9 mg/dL (ref 0.76–1.27)
Glucose: 96 mg/dL (ref 70–99)
Potassium: 3.4 mmol/L — ABNORMAL LOW (ref 3.5–5.2)
Sodium: 139 mmol/L (ref 134–144)
eGFR: 85 mL/min/{1.73_m2} (ref >59)

## 2023-05-20 LAB — PRO B NATRIURETIC PEPTIDE: NT-Pro BNP: 724 pg/mL — ABNORMAL HIGH (ref 0–486)

## 2023-05-21 ENCOUNTER — Other Ambulatory Visit: Payer: Self-pay

## 2023-05-21 DIAGNOSIS — E876 Hypokalemia: Secondary | ICD-10-CM

## 2023-05-21 NOTE — Progress Notes (Signed)
Placed an order for BMP d/t K 3.4.

## 2023-05-28 NOTE — Progress Notes (Signed)
Cardiology Office Note    Date:  05/30/2023  ID:  Grant Flores, DOB Jul 29, 1940, MRN [id number] PCP:  Thana Ates, MD  Cardiologist:  [name], MD  Electrophysiologist:  None   Chief Complaint: f/u edema  History of Present Illness: .    Grant Flores is a 82 y.o. male with visit-pertinent history of CAD with inf-lat MI 2008 s/p BMS to LCx (anginal was throat burning), persistent atrial flutter managed with rate control/anticoagulation strategy, COPD, pulmonary nodule, HTN, ongoing longstanding tobacco abuse, HLD, chronic HFpEF seen for follow-up. Last ischemic eval was via cath in 2008 with mild residual disease managed medically. Last echo 2019 EF 55-60% with severe LVH, G1DD, moderate RAE. He saw Dr. Excell Seltzer 04/2023 at which time he had more pronounced LE edema. This was mentioned in 2022-2023 visits though the patient felt it had worsened over the last month or so. His amlodipine and Lasix were stopped and switched to torsemide 40mg  daily plus KCl daily. Echo is pending 06/09/23. He has not been interested in quitting smoking.  He is seen for follow-up today. His weight is down 5lb from prior. He's seen marginal improvement in his LE edema and has mild erythema as well. He does report good UOP on the torsemide. He has no systemic symptoms of infection. LE elevation was advised but he has found this difficult. He has also been unable to put compression hose on. There is chronic appearing skin thickening. He has chronic unchanged SOB, cough, and hoarseness. No orthopnea, PND, chest pain, syncope or palpitations. He did not recall Dr. Pete Glatter following his pulmonary nodule in prior years. He has never seen a pulmonologist. He is not interested in being hospitalized for IV diuretics at this time.  Labwork independently reviewed:  05/19/23 K 3.4, Cr 0.90, pBNP 724 KPN 01/2023 LDL 34, trig 95, HDL 39, Hgb 14.9 10/2021 CBC wnl 2012 TSH OK  ROS: .    Please see the history of  present illness.  All other systems are reviewed and otherwise negative.  Studies Reviewed: [name]    EKG:  EKG is not ordered today but reviewed from 05/02/23 - known atrial flutter 75bpm nonspecific STTW changes, stable from previous  CV Studies: Cardiac studies reviewed are outlined and summarized above. Otherwise please see EMR for full report.   Current Reported Medications:.    Current Meds  Medication Sig   apixaban (ELIQUIS) 5 MG TABS tablet TAKE 1 TABLET BY MOUTH TWICE  DAILY   atorvastatin (LIPITOR) 80 MG tablet TAKE 1 TABLET BY MOUTH ONCE  DAILY   carvedilol (COREG) 6.25 MG tablet TAKE 1 TABLET BY MOUTH TWICE  DAILY WITH MEALS   potassium chloride (KLOR-CON) 10 MEQ tablet Take 1 tablet (10 mEq total) by mouth daily.   SPIRIVA RESPIMAT 2.5 MCG/ACT AERS Inhale into the lungs as needed.   torsemide (DEMADEX) 20 MG tablet Take 2 tablets (40 mg total) by mouth daily.   [DISCONTINUED] potassium chloride (KLOR-CON) 10 MEQ tablet Take 1 tablet (10 mEq total) by mouth daily.   [DISCONTINUED] torsemide (DEMADEX) 20 MG tablet Take 2 tablets (40 mg total) by mouth daily.    Physical Exam:    VS:  BP 132/78   Pulse 76   Ht 5\' 7"  (1.702 m)   Wt 211 lb (95.7 kg)   SpO2 96%   BMI 33.05 kg/m    Wt Readings from Last 3 Encounters:  05/30/23 211 lb (95.7 kg)  05/02/23 216 lb 6.4  oz (98.2 kg)  05/01/22 216 lb (98 kg)    GEN: Well nourished, well developed in no acute distress NECK: No JVD; No carotid bruits CARDIAC: irregularly irregular, no murmurs, rubs, gallops RESPIRATORY:  Decreased BS throughout without wheezing, mild rhonchi cleared with coughing, generally coarse BS but no acute rales ABDOMEN: Soft, non-tender, non-distended EXTREMITIES:  2+ stiff BLE edema with mild erythema on shins bilaterally; No acute deformity   Asessement and Plan:.    1. Acute on chronic HFpEF, evidence of RHF on exam - I suspect based on chronic skin thickening/hyperpigmentation and prior notes that  he has had clinical right heart failure for some time and we are now starting to see difficulty with progression based on his report. Await echo, scheduled for 06/09/23. He reports limited improvement in edema with transition off amlodipine/Lasix to torsemide 40mg  daily. He had mild hypokalemia on f/u labs earlier this month at which time he had been advised to increase potassium transiently. Discussed with Dr. Excell Seltzer. We will recheck labs today to reassess Cr/K before proceeding with diuretic titration with consideration of metolazone versus titration of torsemide. I will get TSH and CMET today so that we can evaluate albumin as well. Will get f/u CBC as well. Per d/w Dr. Excell Seltzer, hold off rx for cellulitis given appearance compared to previous and no other infective sx. We discussed alternative options to compression hose (ACEI wraps, alternating loop wraps, zipper stockings) and reinforced need to elevate legs as much as possible. Gave him HF info sheet regarding 2g sodium diet, 64oz fluid restriction. Will plan for 3-4 week follow-up after anticipated med changes that are forthcoming.  2. Severe LVH - recheck echocardiogram is pending at this time.  3. CAD, HLD - not on ASA given concomitant Eliquis. Continue atorvastation 80mg  daily. Lipids have been followed in primary care, last LDL 34.  4. Persistent atrial flutter - managed historically with rate control strategy. Rate is normal on carvedilol 3.125mg  BID. Apixaban 5mg  BID dose remains appropriate, follow Cr with labs.  5. Pulmonary nodule, COPD - overdue for follow-up of this. He continues to smoke and has no interest in quitting. I suspect his underlying lung disease is playing a role in his signs of right heart failure. I will obtain a f/u non-contrast CT of the chest to follow his pulmonary nodule and refer to pulmonology for optimization.    Disposition: F/u with me or APP/Dr. Excell Seltzer in 3-4 weeks after anticipate med  change.   Signed, Laurann Montana, PA-C

## 2023-05-29 ENCOUNTER — Other Ambulatory Visit: Payer: Self-pay | Admitting: Cardiovascular Disease

## 2023-05-29 DIAGNOSIS — I483 Typical atrial flutter: Secondary | ICD-10-CM

## 2023-05-29 NOTE — Telephone Encounter (Signed)
Prescription refill request for Eliquis received. Indication: A Flutter Last office visit: 05/02/23  Dayle Points MD Scr: 0.90 on 05/19/23  Epic Age: 82 Weight: 98.2kg  Based on above findings Eliquis 5mg  twice daily is the appropriate dose.  Refill approved.

## 2023-05-30 ENCOUNTER — Encounter: Payer: Self-pay | Admitting: Physician Assistant

## 2023-05-30 ENCOUNTER — Ambulatory Visit: Payer: Medicare Other | Attending: Physician Assistant | Admitting: Physician Assistant

## 2023-05-30 VITALS — BP 132/78 | HR 76 | Ht 67.0 in | Wt 211.0 lb

## 2023-05-30 DIAGNOSIS — I5033 Acute on chronic diastolic (congestive) heart failure: Secondary | ICD-10-CM

## 2023-05-30 DIAGNOSIS — R911 Solitary pulmonary nodule: Secondary | ICD-10-CM | POA: Diagnosis not present

## 2023-05-30 DIAGNOSIS — I517 Cardiomegaly: Secondary | ICD-10-CM

## 2023-05-30 DIAGNOSIS — J449 Chronic obstructive pulmonary disease, unspecified: Secondary | ICD-10-CM | POA: Diagnosis not present

## 2023-05-30 DIAGNOSIS — I4892 Unspecified atrial flutter: Secondary | ICD-10-CM

## 2023-05-30 DIAGNOSIS — I2510 Atherosclerotic heart disease of native coronary artery without angina pectoris: Secondary | ICD-10-CM

## 2023-05-30 DIAGNOSIS — E785 Hyperlipidemia, unspecified: Secondary | ICD-10-CM | POA: Diagnosis not present

## 2023-05-30 NOTE — Patient Instructions (Signed)
Medication Instructions:  Your physician recommends that you continue on your current medications as directed. Please refer to the Current Medication list given to you today.  *If you need a refill on your cardiac medications before your next appointment, please call your pharmacy*   Lab Work: TODAY: CMET, CBC, TSH If you have labs (blood work) drawn today and your tests are completely normal, you will receive your results only by: MyChart Message (if you have MyChart) OR A paper copy in the mail If you have any lab test that is abnormal or we need to change your treatment, we will call you to review the results.   Testing/Procedures: Non-Cardiac CT scanning, (CAT scanning), is a noninvasive, special x-ray that produces cross-sectional images of the body using x-rays and a computer. CT scans help physicians diagnose and treat medical conditions. For some CT exams, a contrast material is used to enhance visibility in the area of the body being studied. CT scans provide greater clarity and reveal more details than regular x-ray exams.    Follow-Up: At [facility], you and your health needs are our priority.  As part of our continuing mission to provide you with exceptional heart care, we have created designated Provider Care Teams.  These Care Teams include your primary Cardiologist (physician) and Advanced Practice Providers (APPs -  Physician Assistants and Nurse Practitioners) who all work together to provide you with the care you need, when you need it.  We recommend signing up for the patient portal called "MyChart".  Sign up information is provided on this After Visit Summary.  MyChart is used to connect with patients for Virtual Visits (Telemedicine).  Patients are able to view lab/test results, encounter notes, upcoming appointments, etc.  Non-urgent messages can be sent to your provider as well.   To learn more about what you can do with MyChart, go to [email or web address].     Your next appointment:   3-4 week(s)  Provider:   [name], MD  or Ronie Spies, PA-C      YOU HAVE BEEN REFERRED TO SEE PULMONOLOGY

## 2023-05-31 LAB — COMPREHENSIVE METABOLIC PANEL
ALT: 13 [IU]/L (ref 0–44)
AST: 17 [IU]/L (ref 0–40)
Albumin: 3.6 g/dL — ABNORMAL LOW (ref 3.7–4.7)
Alkaline Phosphatase: 99 [IU]/L (ref 44–121)
BUN/Creatinine Ratio: 8 — ABNORMAL LOW (ref 10–24)
BUN: 8 mg/dL (ref 8–27)
Bilirubin Total: 0.9 mg/dL (ref 0.0–1.2)
CO2: 25 mmol/L (ref 20–29)
Calcium: 8.7 mg/dL (ref 8.6–10.2)
Chloride: 102 mmol/L (ref 96–106)
Creatinine, Ser: 0.95 mg/dL (ref 0.76–1.27)
Globulin, Total: 2.9 g/dL (ref 1.5–4.5)
Glucose: 91 mg/dL (ref 70–99)
Potassium: 3.7 mmol/L (ref 3.5–5.2)
Sodium: 139 mmol/L (ref 134–144)
Total Protein: 6.5 g/dL (ref 6.0–8.5)
eGFR: 80 mL/min/{1.73_m2} (ref >59)

## 2023-05-31 LAB — TSH: TSH: 1.78 u[IU]/mL (ref 0.450–4.500)

## 2023-05-31 LAB — CBC
Hematocrit: 45.8 % (ref 37.5–51.0)
Hemoglobin: 14.8 g/dL (ref 13.0–17.7)
MCH: 30 pg (ref 26.6–33.0)
MCHC: 32.3 g/dL (ref 31.5–35.7)
MCV: 93 fL (ref 79–97)
Platelets: 229 10*3/uL (ref 150–450)
RBC: 4.93 x10E6/uL (ref 4.14–5.80)
RDW: 13.5 % (ref 11.6–15.4)
WBC: 8.6 10*3/uL (ref 3.4–10.8)

## 2023-06-02 ENCOUNTER — Telehealth: Payer: Self-pay

## 2023-06-02 DIAGNOSIS — Z79899 Other long term (current) drug therapy: Secondary | ICD-10-CM

## 2023-06-02 DIAGNOSIS — I2510 Atherosclerotic heart disease of native coronary artery without angina pectoris: Secondary | ICD-10-CM

## 2023-06-02 DIAGNOSIS — R600 Localized edema: Secondary | ICD-10-CM

## 2023-06-02 DIAGNOSIS — F17200 Nicotine dependence, unspecified, uncomplicated: Secondary | ICD-10-CM

## 2023-06-02 DIAGNOSIS — E782 Mixed hyperlipidemia: Secondary | ICD-10-CM

## 2023-06-02 DIAGNOSIS — I483 Typical atrial flutter: Secondary | ICD-10-CM

## 2023-06-02 DIAGNOSIS — I5032 Chronic diastolic (congestive) heart failure: Secondary | ICD-10-CM

## 2023-06-02 MED ORDER — POTASSIUM CHLORIDE ER 10 MEQ PO TBCR: 20.0000 meq | EXTENDED_RELEASE_TABLET | Freq: Two times a day (BID) | ORAL | 6 refills | Status: DC

## 2023-06-02 MED ORDER — TORSEMIDE 20 MG PO TABS: 40.0000 mg | ORAL_TABLET | Freq: Two times a day (BID) | ORAL | 6 refills | Status: AC

## 2023-06-02 NOTE — Telephone Encounter (Signed)
Spoke with Pt and discussed lab results/recommendations.  Per Dayna: Please let pt know labs are stable. I discussed plan with Dr. Excell Seltzer. Per our discussion:  - increase torsemide to 40mg  BID  - increase KCl to BID  - recheck BMET 1 week, would get when he comes in for his echo  - limit fluid intake to no more than 64 oz per day and sodium to less than 2000mg  per day  - needs to be elevating legs as much as possible anytime he is seated  - if swelling fails to improve, worsens, or any progression of symptoms, sometimes this indicates that we have reached as much as we can do as an outpatient for fluid removal and would present to ER to consider IV diuretics  - otherwise if he sees improvement with this plan, keep f/u as planned   New Rx for torsemide 40 mg BID and potassium 20 meq BID sent to [facility] pharmacy as requested by patient. BMET ordered and released to [facility]. Pt will have labs drawn next week when he comes for echo on 12/23.  Pt verbalized understanding of the above.

## 2023-06-05 ENCOUNTER — Ambulatory Visit (HOSPITAL_COMMUNITY)
Admission: RE | Admit: 2023-06-05 | Discharge: 2023-06-05 | Disposition: A | Discharge status: Home / Self Care | Payer: Medicare Other | Source: Ambulatory Visit | Attending: Physician Assistant | Admitting: Physician Assistant

## 2023-06-05 DIAGNOSIS — R911 Solitary pulmonary nodule: Secondary | ICD-10-CM | POA: Insufficient documentation

## 2023-06-05 DIAGNOSIS — J432 Centrilobular emphysema: Secondary | ICD-10-CM | POA: Diagnosis not present

## 2023-06-05 DIAGNOSIS — R918 Other nonspecific abnormal finding of lung field: Secondary | ICD-10-CM | POA: Diagnosis not present

## 2023-06-09 ENCOUNTER — Ambulatory Visit (HOSPITAL_COMMUNITY): Payer: Medicare Other | Attending: Cardiology

## 2023-06-09 DIAGNOSIS — I483 Typical atrial flutter: Secondary | ICD-10-CM

## 2023-06-09 DIAGNOSIS — I5032 Chronic diastolic (congestive) heart failure: Secondary | ICD-10-CM

## 2023-06-09 DIAGNOSIS — Z79899 Other long term (current) drug therapy: Secondary | ICD-10-CM

## 2023-06-09 DIAGNOSIS — F17200 Nicotine dependence, unspecified, uncomplicated: Secondary | ICD-10-CM

## 2023-06-09 DIAGNOSIS — R600 Localized edema: Secondary | ICD-10-CM

## 2023-06-09 DIAGNOSIS — I2510 Atherosclerotic heart disease of native coronary artery without angina pectoris: Secondary | ICD-10-CM | POA: Diagnosis not present

## 2023-06-09 DIAGNOSIS — E782 Mixed hyperlipidemia: Secondary | ICD-10-CM | POA: Diagnosis not present

## 2023-06-09 LAB — ECHOCARDIOGRAM COMPLETE
Area-P 1/2: 5.38 cm2
S' Lateral: 3.8 cm

## 2023-06-12 DIAGNOSIS — I5032 Chronic diastolic (congestive) heart failure: Secondary | ICD-10-CM | POA: Diagnosis not present

## 2023-06-12 DIAGNOSIS — I2510 Atherosclerotic heart disease of native coronary artery without angina pectoris: Secondary | ICD-10-CM | POA: Diagnosis not present

## 2023-06-12 DIAGNOSIS — Z79899 Other long term (current) drug therapy: Secondary | ICD-10-CM | POA: Diagnosis not present

## 2023-06-13 LAB — BASIC METABOLIC PANEL
BUN/Creatinine Ratio: 9 — ABNORMAL LOW (ref 10–24)
BUN: 9 mg/dL (ref 8–27)
CO2: 22 mmol/L (ref 20–29)
Calcium: 8.8 mg/dL (ref 8.6–10.2)
Chloride: 102 mmol/L (ref 96–106)
Creatinine, Ser: 0.95 mg/dL (ref 0.76–1.27)
Glucose: 79 mg/dL (ref 70–99)
Potassium: 3.8 mmol/L (ref 3.5–5.2)
Sodium: 141 mmol/L (ref 134–144)
eGFR: 80 mL/min/{1.73_m2} (ref >59)

## 2023-06-17 ENCOUNTER — Telehealth: Payer: Self-pay

## 2023-06-17 DIAGNOSIS — Z79899 Other long term (current) drug therapy: Secondary | ICD-10-CM

## 2023-06-17 MED ORDER — LOSARTAN POTASSIUM 25 MG PO TABS: 25.0000 mg | ORAL_TABLET | Freq: Every day | ORAL | 3 refills | Status: AC

## 2023-06-17 NOTE — Telephone Encounter (Signed)
Called and spoke with patient who agrees to plan. Losartan sent to [facility] per his request. He will make a note on his calendar to come in for labs in 1 month. Order placed and released at this time.

## 2023-06-17 NOTE — Telephone Encounter (Signed)
-----   Message from Ozell Fell sent at 06/16/2023  9:31 PM EST ----- Reviewed echo.  Pattern of LV dysfunction fits with his hx of old inferolateral MI. Reasonable to add ARB to his regimen in setting of reduced LVEF. I don't think he will tolerate aggressive GDMT, but ok to start him on losartan  25 mg daily. Continue carvedilol  and torsemide  at current doses. Check BMET in 4 weeks. No further testing recommended. Thanks

## 2023-06-20 ENCOUNTER — Telehealth: Payer: Self-pay | Admitting: Cardiovascular Disease

## 2023-06-20 DIAGNOSIS — C3490 Malignant neoplasm of unspecified part of unspecified bronchus or lung: Secondary | ICD-10-CM

## 2023-06-20 NOTE — Telephone Encounter (Signed)
 Called and reviewed findings with [name]. CT consistent with primary bronchogenic carcinoma and worrisome for lower grad adenocarincomas in RUL. We reviewed that findings are consistent with lung cancer and need for prompt follow up with pulmonology and oncology. He reports no new or worsening dyspnea.   Urgent referral placed to pulmonology for consideration of biopsy.  Urgent referral placed to oncology.   Riddick Nuon S Aubriana Ravelo, NP

## 2023-06-20 NOTE — Telephone Encounter (Signed)
 Caller (Diane) called to report patient's CT Chest Wo Contrast test has been reported.

## 2023-06-22 NOTE — Telephone Encounter (Signed)
 Noted. Appreciate [facility] care while covering this patient. Per echo result note 06/09/23 and lab result note 06/12/23, Dr. Wonda added losartan  given reduced LVEF with plan for conservative management, BMET in 4 weeks. He has an appt with the patient scheduled 06/30/23 at which time further GDMT and med titration can be reviewed if indicated.  Will route this msg to Sarah L. (Dr. Margurite nurse) so she is also in the loop of the possible lung CA diagnosis as well since she had been assisting with the med change above - Lauraine, can you check on the status of referrals to pulmonology and oncology this week that [facility] placed? Would like to see if we can get the ball rolling for Mr. Seabrooks. Thank you.

## 2023-06-23 NOTE — Telephone Encounter (Signed)
 Pulmonary consult scheduled 07/04/23.   Alver Sorrow, NP

## 2023-06-30 ENCOUNTER — Encounter: Payer: Self-pay | Admitting: Cardiovascular Disease

## 2023-06-30 ENCOUNTER — Ambulatory Visit: Payer: Medicare Other | Attending: Cardiovascular Disease | Admitting: Cardiovascular Disease

## 2023-06-30 VITALS — BP 130/90 | HR 83 | Ht 67.0 in | Wt 207.8 lb

## 2023-06-30 DIAGNOSIS — L97801 Non-pressure chronic ulcer of other part of unspecified lower leg limited to breakdown of skin: Secondary | ICD-10-CM

## 2023-06-30 DIAGNOSIS — I483 Typical atrial flutter: Secondary | ICD-10-CM

## 2023-06-30 DIAGNOSIS — C3490 Malignant neoplasm of unspecified part of unspecified bronchus or lung: Secondary | ICD-10-CM | POA: Diagnosis not present

## 2023-06-30 DIAGNOSIS — I2510 Atherosclerotic heart disease of native coronary artery without angina pectoris: Secondary | ICD-10-CM | POA: Diagnosis not present

## 2023-06-30 DIAGNOSIS — R600 Localized edema: Secondary | ICD-10-CM

## 2023-06-30 DIAGNOSIS — F17200 Nicotine dependence, unspecified, uncomplicated: Secondary | ICD-10-CM | POA: Diagnosis not present

## 2023-06-30 DIAGNOSIS — I872 Venous insufficiency (chronic) (peripheral): Secondary | ICD-10-CM

## 2023-06-30 DIAGNOSIS — I5032 Chronic diastolic (congestive) heart failure: Secondary | ICD-10-CM | POA: Diagnosis not present

## 2023-06-30 NOTE — Progress Notes (Signed)
 Cardiology Office Note:    Date:  06/30/2023   ID:  Grant Flores, DOB 11-03-1940, MRN [id number]  PCP:  Kate Lonni LITTIE, MD   [facility] Providers Cardiologist:  Ozell Fell, MD Cardiology APP:  Lelon Glendia ONEIDA DEVONNA     Referring MD: Dwight Trula SQUIBB, MD   Chief Complaint  Patient presents with   Leg Swelling    History of Present Illness:    Grant Flores is a 83 y.o. male with a hx of:  Coronary artery disease S/p inf-lat MI in 2008 >> PCI: BMS to LCx Atrial flutter CHA2DS2-VASc=4 (age x 2, CAD, HTN) >> Apixaban  COPD Pulmonary nodule Tobacco use Hypertension Hyperlipidemia  The patient is here with his wife today.  He has recently had increasing problems with lower extremity edema.  He has developed ulcers on the lower legs.  He denies pain, fever.  Other problems including chronic cough are unchanged.  He denies shortness of breath, chest pain, chest pressure, or throat tightness (previous anginal equivalent).  He is compliant with his medications.  He has been diagnosed with atrial flutter and is taking apixaban  without bleeding problems.  The patient is a longtime smoker and recently had a noncontrast chest CT which demonstrated findings highly suspicious for bronchogenic carcinoma.  He has a pulmonology appointment pending this week.   Current Medications: Current Meds  Medication Sig   apixaban  (ELIQUIS ) 5 MG TABS tablet TAKE 1 TABLET BY MOUTH TWICE  DAILY   atorvastatin  (LIPITOR) 80 MG tablet TAKE 1 TABLET BY MOUTH ONCE  DAILY   carvedilol  (COREG ) 6.25 MG tablet TAKE 1 TABLET BY MOUTH TWICE  DAILY WITH MEALS   losartan  (COZAAR ) 25 MG tablet Take 1 tablet (25 mg total) by mouth daily.   potassium chloride  (KLOR-CON ) 10 MEQ tablet Take 2 tablets (20 mEq total) by mouth 2 (two) times daily.   SPIRIVA RESPIMAT 2.5 MCG/ACT AERS Inhale into the lungs as needed.   torsemide  (DEMADEX ) 20 MG tablet Take 2 tablets (40 mg total) by mouth 2 (two) times  daily.     Allergies:   Patient has no known allergies.   ROS:   Please see the history of present illness.    All other systems reviewed and are negative.  EKGs/Labs/Other Studies Reviewed:    The following studies were reviewed today: Cardiac Studies & Procedures      ECHOCARDIOGRAM  ECHOCARDIOGRAM COMPLETE 06/09/2023  Narrative ECHOCARDIOGRAM REPORT    Patient Name:   Grant Flores Date of Exam: 06/09/2023 Medical Rec #:  [id number]      Height:       67.0 in Accession #:    [id number]     Weight:       211.0 lb Date of Birth:  11/04/1940       BSA:          2.069 m Patient Age:    82 years       BP:           132/78 mmHg Patient Gender: M              HR:           81 bpm. Exam Location:  [address]  Procedure: 2D Echo, Cardiac Doppler, Color Doppler and 3D Echo  Indications:    Congestive Heart Failure I50.9  History:        Patient has prior history of Echocardiogram examinations, most recent 01/14/2018. Previous Myocardial Infarction and  CAD, Arrythmias:Atrial Flutter; Risk Factors:Hypertension.  Sonographer:    Augustin Seals RDCS Referring Phys: [phone] Tyarra Nolton  IMPRESSIONS   1. Left ventricular ejection fraction, by estimation, is 40 to 45%. The left ventricle has mildly decreased function. The left ventricle demonstrates regional wall motion abnormalities (see scoring diagram/findings for description). There is mild left ventricular hypertrophy. Left ventricular diastolic parameters are indeterminate. 2. Right ventricular systolic function is normal. The right ventricular size is normal. Tricuspid regurgitation signal is inadequate for assessing PA pressure. 3. The mitral valve is normal in structure. Trivial mitral valve regurgitation. 4. The aortic valve was not well visualized. Aortic valve regurgitation is not visualized. No aortic stenosis is present. 5. Aortic dilatation noted. There is dilatation of the aortic root, measuring 41 mm. 6. The  inferior vena cava is dilated in size with <50% respiratory variability, suggesting right atrial pressure of 15 mmHg.  FINDINGS Left Ventricle: Left ventricular ejection fraction, by estimation, is 40 to 45%. The left ventricle has mildly decreased function. The left ventricle demonstrates regional wall motion abnormalities. The left ventricular internal cavity size was normal in size. There is mild left ventricular hypertrophy. Left ventricular diastolic parameters are indeterminate.   LV Wall Scoring: The entire inferior wall and mid inferolateral segment are hypokinetic. The entire anterior wall, antero-lateral wall, entire septum, basal inferolateral segment, apical lateral segment, and apex are normal.  Right Ventricle: The right ventricular size is normal. No increase in right ventricular wall thickness. Right ventricular systolic function is normal. Tricuspid regurgitation signal is inadequate for assessing PA pressure.  Left Atrium: Left atrial size was normal in size.  Right Atrium: Right atrial size was normal in size.  Pericardium: The pericardium was not well visualized.  Mitral Valve: The mitral valve is normal in structure. Trivial mitral valve regurgitation.  Tricuspid Valve: The tricuspid valve is normal in structure. Tricuspid valve regurgitation is trivial.  Aortic Valve: The aortic valve was not well visualized. Aortic valve regurgitation is not visualized. No aortic stenosis is present.  Pulmonic Valve: The pulmonic valve was not well visualized. Pulmonic valve regurgitation is not visualized.  Aorta: Aortic dilatation noted. There is dilatation of the aortic root, measuring 41 mm.  Venous: The inferior vena cava is dilated in size with less than 50% respiratory variability, suggesting right atrial pressure of 15 mmHg.  IAS/Shunts: No atrial level shunt detected by color flow Doppler.   LEFT VENTRICLE PLAX 2D LVIDd:         5.60 cm   Diastology LVIDs:          3.80 cm   LV e' medial:    10.80 cm/s LV PW:         1.10 cm   LV E/e' medial:  11.4 LV IVS:        1.20 cm   LV e' lateral:   14.80 cm/s LVOT diam:     2.80 cm   LV E/e' lateral: 8.3 LV SV:         107 LV SV Index:   52 LVOT Area:     6.16 cm  3D Volume EF: 3D EF:        61 % LV EDV:       133 ml LV ESV:       53 ml LV SV:        81 ml  RIGHT VENTRICLE RV Basal diam:  3.80 cm RV Mid diam:    2.70 cm RV S prime:  15.90 cm/s  LEFT ATRIUM             Index        RIGHT ATRIUM           Index LA diam:        4.30 cm 2.08 cm/m   RA Area:     18.80 cm LA Vol (A2C):   52.8 ml 25.52 ml/m  RA Volume:   53.70 ml  25.96 ml/m LA Vol (A4C):   48.5 ml 23.44 ml/m LA Biplane Vol: 56.1 ml 27.12 ml/m AORTIC VALVE LVOT Vmax:   97.30 cm/s LVOT Vmean:  59.800 cm/s LVOT VTI:    0.174 m  AORTA Ao Root diam: 4.10 cm Ao Asc diam:  3.80 cm  MITRAL VALVE MV Area (PHT): 5.38 cm     SHUNTS MV Decel Time: 141 msec     Systemic VTI:  0.17 m MV E velocity: 123.00 cm/s  Systemic Diam: 2.80 cm MV A velocity: 61.00 cm/s MV E/A ratio:  2.02  Lonni Nanas MD Electronically signed by Lonni Nanas MD Signature Date/Time: 06/09/2023/7:38:45 PM    Final             EKG:        Recent Labs: 05/19/2023: NT-Pro BNP 724 05/30/2023: ALT 13; Hemoglobin 14.8; Platelets 229; TSH 1.780 06/12/2023: BUN 9; Creatinine, Ser 0.95; Potassium 3.8; Sodium 141  Recent Lipid Panel    Component Value Date/Time   CHOL  08/14/2007 0340    98        ATP III CLASSIFICATION:  <200     mg/dL   Desirable  799-760  mg/dL   Borderline High  >=759    mg/dL   High   TRIG 60 97/72/7990 0340   HDL 36 (L) 08/14/2007 0340   CHOLHDL 2.7 08/14/2007 0340   VLDL 12 08/14/2007 0340   LDLCALC  08/14/2007 0340    50        Total Cholesterol/HDL:CHD Risk Coronary Heart Disease Risk Table                     Men   Women  1/2 Average Risk   3.4   3.3     Risk Assessment/Calculations:     CHA2DS2-VASc Score = 5   This indicates a 7.2% annual risk of stroke. The patient's score is based upon: CHF History: 1 HTN History: 1 Diabetes History: 0 Stroke History: 0 Vascular Disease History: 1 Age Score: 2 Gender Score: 0           Physical Exam:    VS:  BP (!) 130/90   Pulse 83   Ht 5' 7 (1.702 m)   Wt 207 lb 12.8 oz (94.3 kg)   SpO2 97%   BMI 32.55 kg/m     Wt Readings from Last 3 Encounters:  06/30/23 207 lb 12.8 oz (94.3 kg)  05/30/23 211 lb (95.7 kg)  05/02/23 216 lb 6.4 oz (98.2 kg)     GEN: Elderly appearing male in no acute distress HEENT: Normal NECK: No JVD; No carotid bruits LYMPHATICS: No lymphadenopathy CARDIAC: RRR, no murmurs, rubs, gallops RESPIRATORY: Diffuse rhonchi bilaterally ABDOMEN: Soft, non-tender, non-distended MUSCULOSKELETAL: 2+ pretibial and ankle edema bilaterally with stasis dermatitis and crusted ulcerations; No deformity  SKIN: Warm and dry NEUROLOGIC:  Alert and oriented x 3 PSYCHIATRIC:  Normal affect   Assessment & Plan 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  Not much response to increasing doses of loop diuretics.  Suspect venous stasis as the primary issue.  He has now developed ulceration.  We again discussed leg elevation.  The patient follows sodium restriction.  Refer to wound care clinic.  Compression wraps may be helpful if he is a candidate. Bilateral lower extremity edema As above TOBACCO ABUSE Longstanding, has not been interested in quitting Bronchogenic carcinoma (HCC) New diagnosis.  Pending evaluation with pulmonary this week then oncology pending initial evaluation Chronic heart failure with preserved ejection fraction (HCC) Appears clinically stable.  Echo reviewed with LVEF 40 to 45%.  Continue torsemide , carvedilol , and losartan .  Not inclined to aggressively  change GDMT considering his comorbidities outlined above Typical atrial flutter (HCC) Tolerating apixaban , heart rate controlled. CAD in native artery No anginal symptoms.  Continue atorvastatin .  No antiplatelet therapy because of chronic oral anticoagulation.  LDL cholesterol is 34.            Medication Adjustments/Labs and Tests Ordered: Current medicines are reviewed at length with the patient today.  Concerns regarding medicines are outlined above.  Orders Placed This Encounter  Procedures   Ambulatory referral to Wound Clinic   No orders of the defined types were placed in this encounter.   Patient Instructions  Testing/Procedures:  Ambulatory referral to wound care  Follow-Up: At [facility], you and your health needs are our priority.  As part of our continuing mission to provide you with exceptional heart care, we have created designated Provider Care Teams.  These Care Teams include your primary Cardiologist (physician) and Advanced Practice Providers (APPs -  Physician Assistants and Nurse Practitioners) who all work together to provide you with the care you need, when you need it.  We recommend signing up for the patient portal called MyChart.  Sign up information is provided on this After Visit Summary.  MyChart is used to connect with patients for Virtual Visits (Telemedicine).  Patients are able to view lab/test results, encounter notes, upcoming appointments, etc.  Non-urgent messages can be sent to your provider as well.   To learn more about what you can do with MyChart, go to [email or web address].    Your next appointment:   6 month(s)  Provider:   Ozell Fell, MD             Signed, Ozell Fell, MD  06/30/2023 1:42 PM    [facility]

## 2023-06-30 NOTE — Assessment & Plan Note (Signed)
 Tolerating apixaban, heart rate controlled.

## 2023-06-30 NOTE — Assessment & Plan Note (Signed)
 Longstanding, has not been interested in quitting

## 2023-06-30 NOTE — Assessment & Plan Note (Signed)
 Appears clinically stable.  Echo reviewed with LVEF 40 to 45%.  Continue torsemide, carvedilol, and losartan.  Not inclined to aggressively change GDMT considering his comorbidities outlined above

## 2023-06-30 NOTE — Patient Instructions (Signed)
 Testing/Procedures:  Ambulatory referral to wound care  Follow-Up: At [facility], you and your health needs are our priority.  As part of our continuing mission to provide you with exceptional heart care, we have created designated Provider Care Teams.  These Care Teams include your primary Cardiologist (physician) and Advanced Practice Providers (APPs -  Physician Assistants and Nurse Practitioners) who all work together to provide you with the care you need, when you need it.  We recommend signing up for the patient portal called MyChart.  Sign up information is provided on this After Visit Summary.  MyChart is used to connect with patients for Virtual Visits (Telemedicine).  Patients are able to view lab/test results, encounter notes, upcoming appointments, etc.  Non-urgent messages can be sent to your provider as well.   To learn more about what you can do with MyChart, go to [email or web address].    Your next appointment:   6 month(s)  Provider:   Ozell Fell, MD

## 2023-07-04 ENCOUNTER — Ambulatory Visit: Payer: Medicare Other | Admitting: Acute Care

## 2023-07-04 ENCOUNTER — Telehealth: Payer: Self-pay | Admitting: Physician Assistant

## 2023-07-04 ENCOUNTER — Encounter: Payer: Self-pay | Admitting: Acute Care

## 2023-07-04 ENCOUNTER — Encounter: Payer: Self-pay | Admitting: Emergency Medicine

## 2023-07-04 VITALS — BP 130/82 | HR 55 | Temp 97.7°F | Ht 68.0 in | Wt 210.8 lb

## 2023-07-04 DIAGNOSIS — I872 Venous insufficiency (chronic) (peripheral): Secondary | ICD-10-CM

## 2023-07-04 DIAGNOSIS — R911 Solitary pulmonary nodule: Secondary | ICD-10-CM

## 2023-07-04 DIAGNOSIS — F17200 Nicotine dependence, unspecified, uncomplicated: Secondary | ICD-10-CM

## 2023-07-04 DIAGNOSIS — I5032 Chronic diastolic (congestive) heart failure: Secondary | ICD-10-CM | POA: Diagnosis not present

## 2023-07-04 NOTE — Patient Instructions (Addendum)
It is good to see you today. We have scheduled you for a PET scan to better evaluate the lung nodules . You will get a call to get this scheduled.  We have scheduled you for a bronchoscopy with biopsy. We have discussed the risks of this procedure to include infection, bleeding, pneumothorax, and adverse reaction to anesthesia. You have consented to have this procedure done to biopsy the lung nodule that is concerning for lung cancer. You will receive a letter today with the details regarding your bronchoscopy procedure. You will need to stop your Eliquis a full 2 days prior to your procedure, which is scheduled for July 28, 2023. Last dose of Eliquis will be in the evening on July 25, 2023. Please check with your cardiologist to make sure he is aware that you will be holding your Eliquis for 2 days for a biopsy of the lung. Continue Spiriva as you have been doing. Rinse mouth after use You need to work on quitting smoking. You can receive free nicotine replacement therapy (patches, gum, or mints) by calling 1-800-QUIT NOW. Please call so we can get you on the path to becoming a non-smoker. I know it is hard, but you can do this!  Hypnosis for smoking cessation  [employer] [phone]  Acupuncture for smoking cessation  [facility] [phone]    Call if you have any questions or concerns prior to your procedure. Please contact office for sooner follow up if symptoms do not improve or worsen or seek emergency care

## 2023-07-04 NOTE — Progress Notes (Signed)
History of Present Illness Grant Flores is a 83 y.o. male current every day smoker with history of COPD, chronic heart failure, a flutter on blood thinner, severe LVH, chronic LE edema with ulcerations, Tobacco use, HTN, Hyperlipidemia , referred 06/2023 for pulmonary nodule. He will be seen by Dr. Delton Coombes.  Smokes 1.25 PPD x 56 years ( 70 pack year smoking history) He has COPD, but does not use his Spiriva daily.   07/04/2023 Pt presents for follow up. He has been having lung cancer screening by Dr. Pete Glatter since 11/2017.He initially had LR 3 scan, then in 12/2018 he had a LR 2 reading. Patient did not have annual scanning after the 12/2018 scan.  Pt. Did have a CT Chest 06/2023, ordered by Lucile Crater PA-C. The scan was concerning for bilateral pulmonary nodules .   Grant Flores reported a long-standing smoking habit, with an estimated 56 pack-years. He was asymptomatic with no complaints of cough, hemoptysis, or weight loss. The patient's weight remained stable, and he denied any recent changes in his health status.  The patient had a history of heart disease, with stents placed in 2008, and a subsequent cervical fusion. He reported his health status to be similar to the time of these procedures. He had anesthesia last in 2008, and did not report any adverse effects. The patient also mentioned a history of working in [employer] before he retired. 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  In addition to his respiratory and cardiac conditions, the patient had leg ulcers causing significant discomfort, particularly at night. He has been  scheduled to see a wound care specialist for this issue in February.  The patient was on a regimen of Spiriva for his COPD, which he reported using inconsistently, sometimes skipping days as he felt he did not need it. He was also on Eliquis for anticoagulation.  We discussed the patient's CT scan results, and that they ar concerning for  malignancy, especially with his smoking history. He does not feel this is a concern as he does not have any symptoms. We discussed that early stage cancer may not have symptoms.  The patient expressed some hesitancy about proceeding with a biopsy for the lung mass but ultimately agreed to the procedure. I did discuss my concerns about risks  as he has a significant health history of heart disease . We have discussed the risks of this procedure to include infection, bleeding, pneumothorax, and adverse reaction to anesthesia. He has agreed to proceed. I will order a PET scan to be done as soon as possible for staging and to look for alternative biopsy site that does not require general anesthesia.   I counseled him extensively on quitting smoking. He still smokes 1.25 PPD.He has no interest in quitting. I did tell him it would help both his heart and lungs if he would quit smoking.    Test Results: CT Chest without contrast 06/05/2023 Cardiovascular: Atherosclerotic calcification of the aorta and coronary arteries. Enlarged pulmonic trunk and heart. No pericardial effusion.   Mediastinum/Nodes: No pathologically enlarged mediastinal or axillary lymph nodes. Hilar regions are difficult to definitively evaluate without IV contrast. Esophagus is grossly unremarkable.   Lungs/Pleura: Centrilobular and paraseptal emphysema. New somewhat spiculated nodule in the superior segment left lower lobe measures 1.7 x 2.2 cm (3/53). An eccentrically thick-walled atypical pulmonary cyst in the apical segment right upper lobe (3/44), measures 1.6 x 2.1 cm, new from 12/21/2018. Ground-glass nodule in the  apical segment right upper lobe measures 1.4 x 1.7 cm (3/37), also new. Spiculated apical left upper lobe nodule measures 6 mm (3/24), unchanged from 12/21/2018. No pleural fluid. Debris in the airway.   Upper Abdomen: There may be a small low-attenuation lesion in the left hepatic lobe. Liver is  heterogeneous. Slightly elevated left hemidiaphragm. Visualized portions of the liver, adrenal glands, spleen, pancreas, stomach and bowel are otherwise grossly unremarkable. No upper abdominal adenopathy.   Musculoskeletal: Degenerative changes in the spine.   IMPRESSION: 1. New solid spiculated left lower lobe nodule, indicative of primary bronchogenic carcinoma. 2. Atypical pulmonary cystic and ground-glass nodule in the right upper lobe, worrisome for lower grade adenocarcinomas. 3. These results will be called to the ordering clinician or representative by the Radiologist Assistant, and communication documented in the PACS or [employer]. 4. Liver may be steatotic. 5. Aortic atherosclerosis (ICD10-I70.0). Coronary artery calcification. 6. Enlarged pulmonic trunk, indicative of pulmonary arterial hypertension. 7.  Emphysema (ICD10-J43.9).   11/2017 LDCT LR 3>>05/2028 LDCT LR 3>> 12/2018 LR 2, Then no imagfing  until January 2025 which showed a New solid spiculated left lower lobe nodule, indicative of primary bronchogenic carcinoma, as well as an atypical pulmonary cystic and ground-glass nodule in the right upper lobe, worrisome for lower grade adenocarcinomas.      Latest Ref Rng & Units 05/30/2023   10:10 AM 10/24/2021    4:38 PM 05/27/2011    6:50 AM  CBC  WBC 3.4 - 10.8 x10E3/uL 8.6  9.7  12.8   Hemoglobin 13.0 - 17.7 g/dL 65.7  84.6  9.9   Hematocrit 37.5 - 51.0 % 45.8  47.1  29.7   Platelets 150 - 450 x10E3/uL 229  258  204        Latest Ref Rng & Units 06/12/2023    1:59 PM 05/30/2023   10:10 AM 05/19/2023   11:21 AM  BMP  Glucose 70 - 99 mg/dL 79  91  96   BUN 8 - 27 mg/dL 9  8  11    Creatinine 0.76 - 1.27 mg/dL 9.62  9.52  8.41   BUN/Creat Ratio 10 - 24 9  8  12    Sodium 134 - 144 mmol/L 141  139  139   Potassium 3.5 - 5.2 mmol/L 3.8  3.7  3.4   Chloride 96 - 106 mmol/L 102  102  98   CO2 20 - 29 mmol/L 22  25  26    Calcium 8.6 - 10.2 mg/dL 8.8  8.7   8.9     BNP No results found for: "BNP"  ProBNP    Component Value Date/Time   PROBNP 724 (H) 05/19/2023 1121    PFT No results found for: "FEV1PRE", "FEV1POST", "FVCPRE", "FVCPOST", "TLC", "DLCOUNC", "PREFEV1FVCRT", "PSTFEV1FVCRT"  CT Chest Wo Contrast Result Date: 06/20/2023 CLINICAL DATA:  Pulmonary nodule. EXAM: CT CHEST WITHOUT CONTRAST TECHNIQUE: Multidetector CT imaging of the chest was performed following the standard protocol without IV contrast. RADIATION DOSE REDUCTION: This exam was performed according to the departmental dose-optimization program which includes automated exposure control, adjustment of the mA and/or kV according to patient size and/or use of iterative reconstruction technique. COMPARISON:  12/21/2018. FINDINGS: Cardiovascular: Atherosclerotic calcification of the aorta and coronary arteries. Enlarged pulmonic trunk and heart. No pericardial effusion. Mediastinum/Nodes: No pathologically enlarged mediastinal or axillary lymph nodes. Hilar regions are difficult to definitively evaluate without IV contrast. Esophagus is grossly unremarkable. Lungs/Pleura: Centrilobular and paraseptal emphysema. New somewhat spiculated nodule in the  superior segment left lower lobe measures 1.7 x 2.2 cm (3/53). An eccentrically thick-walled atypical pulmonary cyst in the apical segment right upper lobe (3/44), measures 1.6 x 2.1 cm, new from 12/21/2018. Ground-glass nodule in the apical segment right upper lobe measures 1.4 x 1.7 cm (3/37), also new. Spiculated apical left upper lobe nodule measures 6 mm (3/24), unchanged from 12/21/2018. No pleural fluid. Debris in the airway. Upper Abdomen: There may be a small low-attenuation lesion in the left hepatic lobe. Liver is heterogeneous. Slightly elevated left hemidiaphragm. Visualized portions of the liver, adrenal glands, spleen, pancreas, stomach and bowel are otherwise grossly unremarkable. No upper abdominal adenopathy. Musculoskeletal:  Degenerative changes in the spine. IMPRESSION: 1. New solid spiculated left lower lobe nodule, indicative of primary bronchogenic carcinoma. 2. Atypical pulmonary cystic and ground-glass nodule in the right upper lobe, worrisome for lower grade adenocarcinomas. 3. These results will be called to the ordering clinician or representative by the Radiologist Assistant, and communication documented in the PACS or [employer]. 4. Liver may be steatotic. 5. Aortic atherosclerosis (ICD10-I70.0). Coronary artery calcification. 6. Enlarged pulmonic trunk, indicative of pulmonary arterial hypertension. 7.  Emphysema (ICD10-J43.9). Electronically Signed   By: Leanna Battles M.D.   On: 06/20/2023 14:23   ECHOCARDIOGRAM COMPLETE Result Date: 06/09/2023    ECHOCARDIOGRAM REPORT   Patient Name:   Grant Flores Date of Exam: 06/09/2023 Medical Rec #:  [id number]      Height:       67.0 in Accession #:    [id number]     Weight:       211.0 lb Date of Birth:  [birth year]       BSA:          2.069 m Patient Age:    82 years       BP:           132/78 mmHg Patient Gender: M              HR:           81 bpm. Exam Location:  [address] Procedure: 2D Echo, Cardiac Doppler, Color Doppler and 3D Echo Indications:    Congestive Heart Failure I50.9  History:        Patient has prior history of Echocardiogram examinations, most                 recent 01/14/2018. Previous Myocardial Infarction and CAD,                 Arrythmias:Atrial Flutter; Risk Factors:Hypertension.  Sonographer:    Thurman Coyer RDCS Referring Phys: [phone] MICHAEL COOPER IMPRESSIONS  1. Left ventricular ejection fraction, by estimation, is 40 to 45%. The left ventricle has mildly decreased function. The left ventricle demonstrates regional wall motion abnormalities (see scoring diagram/findings for description). There is mild left ventricular hypertrophy. Left ventricular diastolic parameters are indeterminate.  2. Right ventricular systolic function is  normal. The right ventricular size is normal. Tricuspid regurgitation signal is inadequate for assessing PA pressure.  3. The mitral valve is normal in structure. Trivial mitral valve regurgitation.  4. The aortic valve was not well visualized. Aortic valve regurgitation is not visualized. No aortic stenosis is present.  5. Aortic dilatation noted. There is dilatation of the aortic root, measuring 41 mm.  6. The inferior vena cava is dilated in size with <50% respiratory variability, suggesting right atrial pressure of 15 mmHg. FINDINGS  Left Ventricle: Left ventricular ejection fraction, by estimation, is 40  to 45%. The left ventricle has mildly decreased function. The left ventricle demonstrates regional wall motion abnormalities. The left ventricular internal cavity size was normal in size. There is mild left ventricular hypertrophy. Left ventricular diastolic parameters are indeterminate.  LV Wall Scoring: The entire inferior wall and mid inferolateral segment are hypokinetic. The entire anterior wall, antero-lateral wall, entire septum, basal inferolateral segment, apical lateral segment, and apex are normal. Right Ventricle: The right ventricular size is normal. No increase in right ventricular wall thickness. Right ventricular systolic function is normal. Tricuspid regurgitation signal is inadequate for assessing PA pressure. Left Atrium: Left atrial size was normal in size. Right Atrium: Right atrial size was normal in size. Pericardium: The pericardium was not well visualized. Mitral Valve: The mitral valve is normal in structure. Trivial mitral valve regurgitation. Tricuspid Valve: The tricuspid valve is normal in structure. Tricuspid valve regurgitation is trivial. Aortic Valve: The aortic valve was not well visualized. Aortic valve regurgitation is not visualized. No aortic stenosis is present. Pulmonic Valve: The pulmonic valve was not well visualized. Pulmonic valve regurgitation is not visualized.  Aorta: Aortic dilatation noted. There is dilatation of the aortic root, measuring 41 mm. Venous: The inferior vena cava is dilated in size with less than 50% respiratory variability, suggesting right atrial pressure of 15 mmHg. IAS/Shunts: No atrial level shunt detected by color flow Doppler.  LEFT VENTRICLE PLAX 2D LVIDd:         5.60 cm   Diastology LVIDs:         3.80 cm   LV e' medial:    10.80 cm/s LV PW:         1.10 cm   LV E/e' medial:  11.4 LV IVS:        1.20 cm   LV e' lateral:   14.80 cm/s LVOT diam:     2.80 cm   LV E/e' lateral: 8.3 LV SV:         107 LV SV Index:   52 LVOT Area:     6.16 cm                           3D Volume EF:                          3D EF:        61 %                          LV EDV:       133 ml                          LV ESV:       53 ml                          LV SV:        81 ml RIGHT VENTRICLE RV Basal diam:  3.80 cm RV Mid diam:    2.70 cm RV S prime:     15.90 cm/s LEFT ATRIUM             Index        RIGHT ATRIUM           Index LA diam:        4.30 cm 2.08 cm/m   RA  Area:     18.80 cm LA Vol (A2C):   52.8 ml 25.52 ml/m  RA Volume:   53.70 ml  25.96 ml/m LA Vol (A4C):   48.5 ml 23.44 ml/m LA Biplane Vol: 56.1 ml 27.12 ml/m  AORTIC VALVE LVOT Vmax:   97.30 cm/s LVOT Vmean:  59.800 cm/s LVOT VTI:    0.174 m  AORTA Ao Root diam: 4.10 cm Ao Asc diam:  3.80 cm MITRAL VALVE MV Area (PHT): 5.38 cm     SHUNTS MV Decel Time: 141 msec     Systemic VTI:  0.17 m MV E velocity: 123.00 cm/s  Systemic Diam: 2.80 cm MV A velocity: 61.00 cm/s MV E/A ratio:  2.02 Epifanio Lesches MD Electronically signed by Epifanio Lesches MD Signature Date/Time: 06/09/2023/7:38:45 PM    Final      Past medical hx Past Medical History:  Diagnosis Date   Asthma    CAD (coronary artery disease) October 2008   s/p inferolateral STEMI.... PCI with BMS to LCX. (cath with EF 50%, mod inf HK, RCA 40%, small oD3 70%, OM1 40%, CFX occluded - tx with PCI);  echo 10/08: EF 45%, mild RVE;   Myoview 2009. Inferior infarct. no ischemia, EF 45%   COPD (chronic obstructive pulmonary disease) (HCC)    with ongoing tobacco   High cholesterol    Hypertension    Myocardial infarction [facility]) 2008   Obesity    Shortness of breath    with exertion      Social History   Tobacco Use   Smoking status: Every Day    Current packs/day: 1.00    Types: Cigarettes   Smokeless tobacco: Never   Tobacco comments:    Patient stated that he has gone down to 1 pack a day  Substance Use Topics   Alcohol use: No   Drug use: No    Grant Flores reports that he has been smoking cigarettes. He has never used smokeless tobacco. He reports that he does not drink alcohol and does not use drugs.  Tobacco Cessation: Ready to quit: Not Answered Counseling given: Not Answered Tobacco comments: Patient stated that he has gone down to 1 pack a day When questioned, patient states he is smoking 25 cigarettes daily. This is 1.25 PPD.  Past surgical hx, Family hx, Social hx all reviewed.  Current Outpatient Medications on File Prior to Visit  Medication Sig   apixaban (ELIQUIS) 5 MG TABS tablet TAKE 1 TABLET BY MOUTH TWICE  DAILY   atorvastatin (LIPITOR) 80 MG tablet TAKE 1 TABLET BY MOUTH ONCE  DAILY   carvedilol (COREG) 6.25 MG tablet TAKE 1 TABLET BY MOUTH TWICE  DAILY WITH MEALS   losartan (COZAAR) 25 MG tablet Take 1 tablet (25 mg total) by mouth daily.   potassium chloride (KLOR-CON) 10 MEQ tablet Take 2 tablets (20 mEq total) by mouth 2 (two) times daily.   SPIRIVA RESPIMAT 2.5 MCG/ACT AERS Inhale into the lungs as needed.   torsemide (DEMADEX) 20 MG tablet Take 2 tablets (40 mg total) by mouth 2 (two) times daily.   No current facility-administered medications on file prior to visit.     No Known Allergies  Review Of Systems:  Constitutional:   No  weight loss, night sweats,  Fevers, chills, fatigue, or  lassitude.  HEENT:   No headaches,  Difficulty swallowing,  Tooth/dental problems, or   Sore throat,  No sneezing, itching, ear ache, nasal congestion, post nasal drip,   CV:  No chest pain,  Orthopnea, PND, +++ swelling in lower extremities, No anasarca, dizziness, palpitations, syncope.   GI  No heartburn, indigestion, abdominal pain, nausea, vomiting, diarrhea, change in bowel habits, loss of appetite, bloody stools.   Resp: + shortness of breath with exertion or at rest.  No excess mucus, no productive cough,  No non-productive cough,  No coughing up of blood.  No change in color of mucus.  No wheezing.  No chest wall deformity  Skin: no rash or lesions,stasis wounds to bilateral lower extremities.   GU: no dysuria, change in color of urine, no urgency or frequency.  No flank pain, no hematuria   MS:  No joint pain or swelling.  No decreased range of motion.  No back pain.  Psych:  No change in mood or affect. No depression or anxiety.  No memory loss.   Vital Signs BP 130/82 (BP Location: Right Arm, Patient Position: Sitting, Cuff Size: Small)   Pulse (!) 55   Temp 97.7 F (36.5 C) (Oral)   Ht 5\' 8"  (1.727 m)   Wt 210 lb 12.8 oz (95.6 kg)   SpO2 98%   BMI 32.05 kg/m    Physical Exam:  General- No distress,  A&Ox3, pleasant ENT: No sinus tenderness, TM clear, pale nasal mucosa, no oral exudate,no post nasal drip, no LAN Cardiac: S1, S2, regular rate and rhythm, no murmur Chest: No wheeze/ rales/ dullness; no accessory muscle use, no nasal flaring, no sternal retractions, diminished per bases Abd.: Soft Non-tender, ND, BS +, Body mass index is 32.05 kg/m.  Ext: No clubbing cyanosis, 2-3 + BLE edema Neuro:  physical deconditioning, MAE x 4, A&O x 3 Skin: No rashes, warm and dry, no obvious lesions  Psych: normal mood and behavior   Assessment/Plan Suspected Lung Cancer Multiple lung nodules identified on recent imaging, with the largest being 1.7 by 2.2 cm in the superior segment of the left lower lobe. Patient has a significant smoking  history. No symptoms of weight loss or hemoptysis. Discussed the need for biopsy to confirm diagnosis and determine treatment options. -Schedule PET scan to assess for possible metastasis. -Schedule bronchoscopy with biopsy under general anesthesia. Patient to hold Eliquis 48 hours prior to procedure. -You will receive a letter today with the details regarding your bronchoscopy procedure. -You will need to stop your Eliquis a full 2 days prior to your procedure, which is scheduled for July 28, 2023. -Last dose of Eliquis will be in the evening on July 25, 2023. -Please check with your cardiologist to make sure he is aware that you will be holding your Eliquis for 2 days for a biopsy of the lung. -Telephone visit to discuss PET scan results and determine if alternative biopsy site is available.  COPD Patient reports inconsistent use of Spiriva, a maintenance medication for COPD. -Encourage daily use of Spiriva  as maintenance to decrease inflammation and prevent shortness of breath.  Tobacco Use Patient continues to smoke 1 to 1.25 packs per day for the past 56 years. Discussed the risks associated with continued smoking, particularly in the context of suspected lung cancer and existing heart conditions. -Encourage smoking cessation. Suggested use of nicotine patches, gum, or mints from 1-800-QUIT-NOW.  Venous Ulcers Patient reports discomfort from venous ulcers on legs, affecting sleep. -Scheduled appointment with wound care next month. -Call cardiology for fever, or discolored drainage  Heart Failure Chronic condition, no  acute issues  -Continued follow up with Cardiology -Continue Lasix as is ordered  General Health Maintenance -Continue monitoring weight for signs of heart failure exacerbation.   I spent 40 minutes dedicated to the care of this patient on the date of this encounter to include pre-visit review of records, face-to-face time with the patient discussing  conditions above, post visit ordering of testing, clinical documentation with the electronic health record, making appropriate referrals as documented, and communicating necessary information to the patient's healthcare team.    Bevelyn Ngo, NP 07/04/2023  9:10 AM

## 2023-07-04 NOTE — Telephone Encounter (Addendum)
Received OV note from [name], NP with pulmonology re: suspected lung cancer. Appreciate the update. They are planning bronchoscopy 07/28/23. Sarah's note indicated plan to hold Eliquis 2 full days prior to the procedure and advised patient to check with [place]. Dr. Excell Seltzer recently saw patient in follow-up. He was not inclined to pursue additional testing or aggressively change GDMT given his comorbidities and question of bronchogenic CA. Patient is at increased operative risk due to comorbidities but agree with conservative approach.   I reviewed with our pharmD C. Pavero who agrees 2 day hold of Eliquis is sufficient. I will route this note to our office to notify patient that we are in agreement with the recommendation to hold Eliquis for 2 days prior to his bronchoscopy, last dose the evening of February 7th. (Not sending to preop because this was handled outside the preop box.) Pulmonary team should inform patient when to restart post-biopsy, as soon as felt safe from their standpoint.  Will also cc to Dr. Excell Seltzer so he is in the loop as well.  Will also close the loop to let Maralyn Sago know the above.

## 2023-07-04 NOTE — Telephone Encounter (Signed)
Attempted to reach patient. No answer, no voicemail.

## 2023-07-05 NOTE — Telephone Encounter (Signed)
Thank you. I agree 

## 2023-07-09 ENCOUNTER — Telehealth: Payer: Self-pay | Admitting: Emergency Medicine

## 2023-07-09 NOTE — Telephone Encounter (Signed)
Lmam for patient telling them to still arive at 5:30 am

## 2023-07-09 NOTE — Telephone Encounter (Signed)
Please let pt know that we changed his bronch time on 2/10 from 7:30 to 8:45am. He still needs to show up at the same time. Thanks.

## 2023-07-17 ENCOUNTER — Ambulatory Visit (HOSPITAL_COMMUNITY)
Admission: RE | Admit: 2023-07-17 | Discharge: 2023-07-17 | Disposition: A | Discharge status: Home / Self Care | Payer: Medicare Other | Source: Ambulatory Visit | Attending: Acute Care | Admitting: Acute Care

## 2023-07-17 DIAGNOSIS — R918 Other nonspecific abnormal finding of lung field: Secondary | ICD-10-CM | POA: Diagnosis not present

## 2023-07-17 DIAGNOSIS — R911 Solitary pulmonary nodule: Secondary | ICD-10-CM | POA: Diagnosis not present

## 2023-07-17 LAB — GLUCOSE, CAPILLARY: Glucose-Capillary: 87 mg/dL (ref 70–99)

## 2023-07-17 MED ORDER — FLUDEOXYGLUCOSE F - 18 (FDG) INJECTION
10.5000 | Freq: Once | INTRAVENOUS | Status: AC
  Administered 2023-07-17: 10.32 via INTRAVENOUS

## 2023-07-25 ENCOUNTER — Other Ambulatory Visit: Payer: Self-pay

## 2023-07-25 ENCOUNTER — Other Ambulatory Visit: Payer: Self-pay | Admitting: Acute Care

## 2023-07-25 ENCOUNTER — Encounter (HOSPITAL_COMMUNITY): Payer: Self-pay | Admitting: Emergency Medicine

## 2023-07-25 ENCOUNTER — Telehealth: Payer: Self-pay | Admitting: Acute Care

## 2023-07-25 DIAGNOSIS — J069 Acute upper respiratory infection, unspecified: Secondary | ICD-10-CM

## 2023-07-25 MED ORDER — AZITHROMYCIN 250 MG PO TABS: ORAL_TABLET | ORAL | 0 refills | Status: AC

## 2023-07-25 NOTE — Progress Notes (Signed)
 Anesthesia Chart Review: Same day workup  83 year old male follows with cardiology for history of CAD s/p inferior lateral MI in 2008 with BMS to LCx, combined heart failure, atrial flutter on Eliquis , HTN, HLD, lower extremity edema.  Last seen by Dr. Wonda 06/30/2023.  Per note, he continues to have worsening lower extremity edema and has developed ulceration.  Venous stasis felt to be primary issue.  He was referred to wound care clinic.  Otherwise stable from cardiac standpoint.  Upcoming surgery was discussed and telephone encounter 07/04/2023 by Raphael Bring, PA-C, Received OV note from Lauraine Lites, NP with pulmonology re: suspected lung cancer. Appreciate the update. They are planning bronchoscopy 07/28/23. Sarah's note indicated plan to hold Eliquis  2 full days prior to the procedure and advised patient to check with us . Dr. Wonda recently saw patient in follow-up. He was not inclined to pursue additional testing or aggressively change GDMT given his comorbidities and question of bronchogenic CA. Patient is at increased operative risk due to comorbidities but agree with conservative approach. I reviewed with our pharmD C. Pavero who agrees 2 day hold of Eliquis  is sufficient. I will route this note to our office to notify patient that we are in agreement with the recommendation to hold Eliquis  for 2 days prior to his bronchoscopy, last dose the evening of February 7th. (Not sending to preop because this was handled outside the preop box.) Pulmonary team should inform patient when to restart post-biopsy, as soon as felt safe from their standpoint.  Patient is a current every day smoker with associated COPD maintained on Spiriva.  Recent chest imaging identified multiple lung nodules with the largest being 1.7 x 2.2 cm in the superior segment of the left lower lobe, suspicious for lung cancer.  Reports last dose Eliquis  07/24/2023.  Patient reported having a runny nose on preop phone call.  Denied any  other symptoms of illness.  He was advised by preop RN that if he develops any additional symptoms he should reach out to Dr. Lanny office prior to surgery.  Patient will need day of surgery labs and evaluation.  EKG 05/02/2023: atrial flutter with 4:1 conduction.  Ventricular rate 75.  CT chest 06/05/2023: IMPRESSION: 1. New solid spiculated left lower lobe nodule, indicative of primary bronchogenic carcinoma. 2. Atypical pulmonary cystic and ground-glass nodule in the right upper lobe, worrisome for lower grade adenocarcinomas. 3. These results will be called to the ordering clinician or representative by the Radiologist Assistant, and communication documented in the PACS or [employer]. 4. Liver may be steatotic. 5. Aortic atherosclerosis (ICD10-I70.0). Coronary artery calcification. 6. Enlarged pulmonic trunk, indicative of pulmonary arterial hypertension. 7.  Emphysema (ICD10-J43.9).  TTE 06/09/2023:  1. Left ventricular ejection fraction, by estimation, is 40 to 45%. The  left ventricle has mildly decreased function. The left ventricle  demonstrates regional wall motion abnormalities (see scoring  diagram/findings for description). There is mild left  ventricular hypertrophy. Left ventricular diastolic parameters are  indeterminate.   2. Right ventricular systolic function is normal. The right ventricular  size is normal. Tricuspid regurgitation signal is inadequate for assessing  PA pressure.   3. The mitral valve is normal in structure. Trivial mitral valve  regurgitation.   4. The aortic valve was not well visualized. Aortic valve regurgitation  is not visualized. No aortic stenosis is present.   5. Aortic dilatation noted. There is dilatation of the aortic root,  measuring 41 mm.   6. The inferior vena cava is dilated  in size with <50% respiratory  variability, suggesting right atrial pressure of 15 mmHg.     Lynwood Geofm RIGGERS [facility] Short Stay  Center/Anesthesiology Phone [phone] 07/25/2023 10:49 AM

## 2023-07-25 NOTE — Telephone Encounter (Signed)
 Ruthell Lauraine FALCON, NP to Me  Lbpu Triage Pool     07/25/23  3:44 PM  I have sent in a z pack to the [facility]. Take as directed. He needs to Covid Test ,and Flu test ( Should be able to get kits at [facility] ) . If he has flu or Covid we will need to postpone the bronch. Thanks  Called the pt back and there was unfortunately no answer- LMTCB.

## 2023-07-25 NOTE — Progress Notes (Signed)
 I have sent in a z pack to the [employer]. Take as directed. He needs to Covid Test ,and Flu test ( Should be able to get kits at [facility] ) . If he has flu or Covid we will need to postpone the bronch.  Thanks

## 2023-07-25 NOTE — Telephone Encounter (Signed)
 Grant Lauraine FALCON, NP to [name], CMA  Lbpu Triage [facility]     07/25/23  2:36 PM  Does he have a fever, has he tested for Covid, what color are the secretions draining from his nose? Thanks  Called and spoke with the pt  No fever  Nasal drainage is clear  Has not taken covid test

## 2023-07-25 NOTE — Telephone Encounter (Signed)
 Patient has a bronch for Monday but is starting to get a cold. It is bothering his left eye. Please call and advise [phone]

## 2023-07-25 NOTE — Telephone Encounter (Signed)
 I called and spoke with the Grant Flores and notified of response from Isa Manuel  He verbalized understanding  Nothing further needed

## 2023-07-25 NOTE — Telephone Encounter (Signed)
 I called and spoke to pt. Pt states his left eye and nostril is running. His eye is watery. Pt states he is taking coricidin for cold and flu. Pt states he is scheduled for a bronch on Monday and wants to know what he needs to do or if this will effect anything. Please advise.

## 2023-07-25 NOTE — Progress Notes (Signed)
 PCP [facility] Cardiologist -Ozell Fell, MD   Chest x-ray - 03/16/14 EKG - 05/02/23 Stress Test - 04/10/11 ECHO - 06/09/23 Cardiac Cath - 2008  Blood Thinner Instructions: Stop Eliquis  2 days prior to surgery. Last dose 07/24/23  Pt states he is starting to possibly develop a cold. Currently just has a runny nose. No fever, no cough, no loss of taste. Asked pt to call Dr. Lanny office to make them aware. Will let Lynwood and Isaiah aware.   Anesthesia review: Y  Patient verbally denies any shortness of breath, fever, cough and chest pain during phone call   -------------  SDW INSTRUCTIONS given:  Your procedure is scheduled on Monday, February 10th.  Report to [facility] Main Entrance A at 0530 A.M., and check in at the Admitting office.  Call this number if you have problems the morning of surgery:  [phone]   Remember:  Do not eat or drink after midnight the night before your surgery    Take these medicines the morning of surgery with A SIP OF WATER  Coreg  Spiriva  As of today, STOP taking any Aspirin  (unless otherwise instructed by your surgeon) Aleve, Naproxen, Ibuprofen, Motrin, Advil, Goody's, BC's, all herbal medications, fish oil, and all vitamins.                      Do not wear jewelry, make up, or nail polish            Do not wear lotions, powders, perfumes/colognes, or deodorant.            Do not shave 48 hours prior to surgery.  Men may shave face and neck.            Do not bring valuables to the hospital.            [facility] is not responsible for any belongings or valuables.  Do NOT Smoke (Tobacco/Vaping) 24 hours prior to your procedure If you use a CPAP at night, you may bring all equipment for your overnight stay.   Contacts, glasses, dentures or bridgework may not be worn into surgery.      For patients admitted to the hospital, discharge time will be determined by your treatment team.   Patients discharged the day of  surgery will not be allowed to drive home, and someone needs to stay with them for 24 hours.    Special instructions:   [place]- Preparing For Surgery  Before surgery, you can play an important role. Because skin is not sterile, your skin needs to be as free of germs as possible. You can reduce the number of germs on your skin by washing with CHG (chlorahexidine gluconate) Soap before surgery.  CHG is an antiseptic cleaner which kills germs and bonds with the skin to continue killing germs even after washing.    Oral Hygiene is also important to reduce your risk of infection.  Remember - BRUSH YOUR TEETH THE MORNING OF SURGERY WITH YOUR REGULAR TOOTHPASTE  Please do not use if you have an allergy to CHG or antibacterial soaps. If your skin becomes reddened/irritated stop using the CHG.  Do not shave (including legs and underarms) for at least 48 hours prior to first CHG shower. It is OK to shave your face.  Please follow these instructions carefully.   Shower the NIGHT BEFORE SURGERY and the MORNING OF SURGERY with DIAL Soap.   Pat yourself dry with a CLEAN TOWEL.  Wear CLEAN PAJAMAS to bed the night before surgery  Place CLEAN SHEETS on your bed the night of your first shower and DO NOT SLEEP WITH PETS.   Day of Surgery: Please shower morning of surgery  Wear Clean/Comfortable clothing the morning of surgery Do not apply any deodorants/lotions.   Remember to brush your teeth WITH YOUR REGULAR TOOTHPASTE.   Questions were answered. Patient verbalized understanding of instructions.

## 2023-07-25 NOTE — Anesthesia Preprocedure Evaluation (Addendum)
 Anesthesia Evaluation  Patient identified by MRN, date of birth, ID band Patient awake    Reviewed: Allergy & Precautions, H&P , NPO status , Patient's Chart, lab work & pertinent test results  Airway Mallampati: II  TM Distance: >3 FB Neck ROM: Full    Dental no notable dental hx. (+) Edentulous Upper, Edentulous Lower, Dental Advisory Given   Pulmonary asthma , COPD,  COPD inhaler, Current Smoker   Pulmonary exam normal breath sounds clear to auscultation       Cardiovascular Exercise Tolerance: Good hypertension, Pt. on medications and Pt. on home beta blockers + CAD, + Past MI and + Cardiac Stents   Rhythm:Regular Rate:Normal     Neuro/Psych negative neurological ROS  negative psych ROS   GI/Hepatic negative GI ROS, Neg liver ROS,,,  Endo/Other  negative endocrine ROS    Renal/GU negative Renal ROS  negative genitourinary   Musculoskeletal  (+) Arthritis , Osteoarthritis,    Abdominal   Peds  Hematology negative hematology ROS (+)   Anesthesia Other Findings   Reproductive/Obstetrics negative OB ROS                             Anesthesia Physical Anesthesia Plan  ASA: 3  Anesthesia Plan: General   Post-op Pain Management: Tylenol  PO (pre-op)*   Induction: Intravenous  PONV Risk Score and Plan: 2 and Ondansetron , Dexamethasone, Propofol  infusion and TIVA  Airway Management Planned: Oral ETT  Additional Equipment:   Intra-op Plan:   Post-operative Plan: Extubation in OR  Informed Consent: I have reviewed the patients History and Physical, chart, labs and discussed the procedure including the risks, benefits and alternatives for the proposed anesthesia with the patient or authorized representative who has indicated his/her understanding and acceptance.     Dental advisory given  Plan Discussed with: CRNA  Anesthesia Plan Comments: (PAT note by Lynwood Hope,  PA-C: 83 year old male follows with cardiology for history of CAD s/p inferior lateral MI in 2008 with BMS to LCx, combined heart failure, atrial flutter on Eliquis , HTN, HLD, lower extremity edema.  Last seen by Dr. Wonda 06/30/2023.  Per note, he continues to have worsening lower extremity edema and has developed ulceration.  Venous stasis felt to be primary issue.  He was referred to wound care clinic.  Otherwise stable from cardiac standpoint.  Upcoming surgery was discussed and telephone encounter 07/04/2023 by Raphael Bring, PA-C, Received OV note from Lauraine Lites, NP with pulmonology re: suspected lung cancer. Appreciate the update. They are planning bronchoscopy 07/28/23. Sarah's note indicated plan to hold Eliquis  2 full days prior to the procedure and advised patient to check with us . Dr. Wonda recently saw patient in follow-up. He was not inclined to pursue additional testing or aggressively change GDMT given his comorbidities and question of bronchogenic CA. Patient is at increased operative risk due to comorbidities but agree with conservative approach. I reviewed with our pharmD C. Pavero who agrees 2 day hold of Eliquis  is sufficient. I will route this note to our office to notify patient that we are in agreement with the recommendation to hold Eliquis  for 2 days prior to his bronchoscopy, last dose the evening of February 7th. (Not sending to preop because this was handled outside the preop box.) Pulmonary team should inform patient when to restart post-biopsy, as soon as felt safe from their standpoint.  Patient is a current every day smoker with associated COPD maintained on Spiriva.  Recent chest imaging identified multiple lung nodules with the largest being 1.7 x 2.2 cm in the superior segment of the left lower lobe, suspicious for lung cancer.  Reports last dose Eliquis  07/24/2023.  Patient reported having a runny nose on preop phone call.  Denied any other symptoms of illness.  He was  advised by preop RN that if he develops any additional symptoms he should reach out to Dr. Lanny office prior to surgery.  Patient will need day of surgery labs and evaluation.  EKG 05/02/2023: atrial flutter with 4:1 conduction.  Ventricular rate 75.  CT chest 06/05/2023: IMPRESSION: 1. New solid spiculated left lower lobe nodule, indicative of primary bronchogenic carcinoma. 2. Atypical pulmonary cystic and ground-glass nodule in the right upper lobe, worrisome for lower grade adenocarcinomas. 3. These results will be called to the ordering clinician or representative by the Radiologist Assistant, and communication documented in the PACS or [employer]. 4. Liver may be steatotic. 5. Aortic atherosclerosis (ICD10-I70.0). Coronary artery calcification. 6. Enlarged pulmonic trunk, indicative of pulmonary arterial hypertension. 7.  Emphysema (ICD10-J43.9).  TTE 06/09/2023: 1. Left ventricular ejection fraction, by estimation, is 40 to 45%. The  left ventricle has mildly decreased function. The left ventricle  demonstrates regional wall motion abnormalities (see scoring  diagram/findings for description). There is mild left  ventricular hypertrophy. Left ventricular diastolic parameters are  indeterminate.  2. Right ventricular systolic function is normal. The right ventricular  size is normal. Tricuspid regurgitation signal is inadequate for assessing  PA pressure.  3. The mitral valve is normal in structure. Trivial mitral valve  regurgitation.  4. The aortic valve was not well visualized. Aortic valve regurgitation  is not visualized. No aortic stenosis is present.  5. Aortic dilatation noted. There is dilatation of the aortic root,  measuring 41 mm.  6. The inferior vena cava is dilated in size with <50% respiratory  variability, suggesting right atrial pressure of 15 mmHg.  )        Anesthesia Quick Evaluation

## 2023-07-28 ENCOUNTER — Ambulatory Visit (HOSPITAL_COMMUNITY): Payer: Medicare Other

## 2023-07-28 ENCOUNTER — Encounter (HOSPITAL_COMMUNITY)
Admission: RE | Disposition: A | Discharge status: Home / Self Care | Payer: Self-pay | Source: Home / Self Care | Attending: Emergency Medicine

## 2023-07-28 ENCOUNTER — Encounter (HOSPITAL_COMMUNITY): Payer: Self-pay | Admitting: Emergency Medicine

## 2023-07-28 ENCOUNTER — Other Ambulatory Visit: Payer: Self-pay

## 2023-07-28 ENCOUNTER — Ambulatory Visit (HOSPITAL_COMMUNITY)
Admission: RE | Admit: 2023-07-28 | Discharge: 2023-07-28 | Disposition: A | Discharge status: Home / Self Care | Payer: Medicare Other | Attending: Emergency Medicine | Admitting: Emergency Medicine

## 2023-07-28 ENCOUNTER — Ambulatory Visit (HOSPITAL_COMMUNITY): Payer: Medicare Other | Admitting: Physician Assistant

## 2023-07-28 ENCOUNTER — Ambulatory Visit (HOSPITAL_BASED_OUTPATIENT_CLINIC_OR_DEPARTMENT_OTHER): Payer: Medicare Other | Admitting: Physician Assistant

## 2023-07-28 DIAGNOSIS — C3432 Malignant neoplasm of lower lobe, left bronchus or lung: Secondary | ICD-10-CM | POA: Diagnosis not present

## 2023-07-28 DIAGNOSIS — I509 Heart failure, unspecified: Secondary | ICD-10-CM | POA: Diagnosis not present

## 2023-07-28 DIAGNOSIS — J432 Centrilobular emphysema: Secondary | ICD-10-CM | POA: Insufficient documentation

## 2023-07-28 DIAGNOSIS — Z7901 Long term (current) use of anticoagulants: Secondary | ICD-10-CM | POA: Diagnosis not present

## 2023-07-28 DIAGNOSIS — R0989 Other specified symptoms and signs involving the circulatory and respiratory systems: Secondary | ICD-10-CM | POA: Diagnosis not present

## 2023-07-28 DIAGNOSIS — M1990 Unspecified osteoarthritis, unspecified site: Secondary | ICD-10-CM | POA: Diagnosis not present

## 2023-07-28 DIAGNOSIS — E669 Obesity, unspecified: Secondary | ICD-10-CM | POA: Diagnosis not present

## 2023-07-28 DIAGNOSIS — I110 Hypertensive heart disease with heart failure: Secondary | ICD-10-CM | POA: Insufficient documentation

## 2023-07-28 DIAGNOSIS — I10 Essential (primary) hypertension: Secondary | ICD-10-CM | POA: Diagnosis not present

## 2023-07-28 DIAGNOSIS — F17210 Nicotine dependence, cigarettes, uncomplicated: Secondary | ICD-10-CM | POA: Insufficient documentation

## 2023-07-28 DIAGNOSIS — R918 Other nonspecific abnormal finding of lung field: Secondary | ICD-10-CM | POA: Diagnosis not present

## 2023-07-28 DIAGNOSIS — I4891 Unspecified atrial fibrillation: Secondary | ICD-10-CM | POA: Insufficient documentation

## 2023-07-28 DIAGNOSIS — C3411 Malignant neoplasm of upper lobe, right bronchus or lung: Secondary | ICD-10-CM | POA: Diagnosis not present

## 2023-07-28 DIAGNOSIS — I252 Old myocardial infarction: Secondary | ICD-10-CM | POA: Insufficient documentation

## 2023-07-28 DIAGNOSIS — I700 Atherosclerosis of aorta: Secondary | ICD-10-CM | POA: Insufficient documentation

## 2023-07-28 DIAGNOSIS — I483 Typical atrial flutter: Secondary | ICD-10-CM

## 2023-07-28 DIAGNOSIS — I2510 Atherosclerotic heart disease of native coronary artery without angina pectoris: Secondary | ICD-10-CM | POA: Diagnosis not present

## 2023-07-28 DIAGNOSIS — I517 Cardiomegaly: Secondary | ICD-10-CM | POA: Diagnosis not present

## 2023-07-28 DIAGNOSIS — Z79899 Other long term (current) drug therapy: Secondary | ICD-10-CM | POA: Insufficient documentation

## 2023-07-28 DIAGNOSIS — Z48813 Encounter for surgical aftercare following surgery on the respiratory system: Secondary | ICD-10-CM | POA: Diagnosis not present

## 2023-07-28 DIAGNOSIS — I4892 Unspecified atrial flutter: Secondary | ICD-10-CM | POA: Insufficient documentation

## 2023-07-28 DIAGNOSIS — Z955 Presence of coronary angioplasty implant and graft: Secondary | ICD-10-CM | POA: Diagnosis not present

## 2023-07-28 DIAGNOSIS — E7800 Pure hypercholesterolemia, unspecified: Secondary | ICD-10-CM | POA: Diagnosis not present

## 2023-07-28 DIAGNOSIS — I5030 Unspecified diastolic (congestive) heart failure: Secondary | ICD-10-CM | POA: Diagnosis not present

## 2023-07-28 HISTORY — PX: BRONCHIAL BRUSHINGS: SHX5108

## 2023-07-28 HISTORY — PX: FIDUCIAL MARKER PLACEMENT: SHX6858

## 2023-07-28 HISTORY — PX: BRONCHIAL BIOPSY: SHX5109

## 2023-07-28 HISTORY — PX: BRONCHIAL NEEDLE ASPIRATION BIOPSY: SHX5106

## 2023-07-28 HISTORY — PX: BRONCHIAL WASHINGS: SHX5105

## 2023-07-28 LAB — CBC
HCT: 46.4 % (ref 39.0–52.0)
Hemoglobin: 14.9 g/dL (ref 13.0–17.0)
MCH: 29.6 pg (ref 26.0–34.0)
MCHC: 32.1 g/dL (ref 30.0–36.0)
MCV: 92.2 fL (ref 80.0–100.0)
Platelets: 263 10*3/uL (ref 150–400)
RBC: 5.03 MIL/uL (ref 4.22–5.81)
RDW: 15.5 % (ref 11.5–15.5)
WBC: 11.7 10*3/uL — ABNORMAL HIGH (ref 4.0–10.5)
nRBC: 0 % (ref 0.0–0.2)

## 2023-07-28 LAB — BASIC METABOLIC PANEL
Anion gap: 10 (ref 5–15)
BUN: 5 mg/dL — ABNORMAL LOW (ref 8–23)
CO2: 24 mmol/L (ref 22–32)
Calcium: 8.5 mg/dL — ABNORMAL LOW (ref 8.9–10.3)
Chloride: 103 mmol/L (ref 98–111)
Creatinine, Ser: 0.96 mg/dL (ref 0.61–1.24)
GFR, Estimated: >60 mL/min (ref >60)
Glucose, Bld: 96 mg/dL (ref 70–99)
Potassium: 3.6 mmol/L (ref 3.5–5.1)
Sodium: 137 mmol/L (ref 135–145)

## 2023-07-28 SURGERY — BRONCHOSCOPY, WITH BIOPSY USING ELECTROMAGNETIC NAVIGATION
Anesthesia: General | Laterality: Bilateral

## 2023-07-28 MED ORDER — FENTANYL CITRATE (PF) 250 MCG/5ML IJ SOLN
INTRAMUSCULAR | Status: DC | PRN
  Administered 2023-07-28: 50 ug via INTRAVENOUS

## 2023-07-28 MED ORDER — SUGAMMADEX SODIUM 200 MG/2ML IV SOLN
INTRAVENOUS | Status: DC | PRN
  Administered 2023-07-28: 200 mg via INTRAVENOUS

## 2023-07-28 MED ORDER — LIDOCAINE 2% (20 MG/ML) 5 ML SYRINGE
INTRAMUSCULAR | Status: DC | PRN
  Administered 2023-07-28: 60 mg via INTRAVENOUS

## 2023-07-28 MED ORDER — CHLORHEXIDINE GLUCONATE 0.12 % MT SOLN
OROMUCOSAL | Status: AC
  Administered 2023-07-28: 15 mL via OROMUCOSAL
  Filled 2023-07-28: qty 15

## 2023-07-28 MED ORDER — CARVEDILOL 12.5 MG PO TABS
6.2500 mg | ORAL_TABLET | Freq: Once | ORAL | Status: AC
  Administered 2023-07-28: 6.25 mg via ORAL
  Filled 2023-07-28: qty 1

## 2023-07-28 MED ORDER — ONDANSETRON HCL 4 MG/2ML IJ SOLN
INTRAMUSCULAR | Status: DC | PRN
  Administered 2023-07-28: 4 mg via INTRAVENOUS

## 2023-07-28 MED ORDER — PHENYLEPHRINE 80 MCG/ML (10ML) SYRINGE FOR IV PUSH (FOR BLOOD PRESSURE SUPPORT)
PREFILLED_SYRINGE | INTRAVENOUS | Status: DC | PRN
  Administered 2023-07-28 (×3): 160 ug via INTRAVENOUS

## 2023-07-28 MED ORDER — CHLORHEXIDINE GLUCONATE 0.12 % MT SOLN: 15.0000 mL | Freq: Once | OROMUCOSAL | Status: AC

## 2023-07-28 MED ORDER — APIXABAN 5 MG PO TABS: 5.0000 mg | ORAL_TABLET | Freq: Two times a day (BID) | ORAL | Status: DC

## 2023-07-28 MED ORDER — PROPOFOL 500 MG/50ML IV EMUL
INTRAVENOUS | Status: DC | PRN
  Administered 2023-07-28: 125 ug/kg/min via INTRAVENOUS

## 2023-07-28 MED ORDER — ROCURONIUM BROMIDE 10 MG/ML (PF) SYRINGE
PREFILLED_SYRINGE | INTRAVENOUS | Status: DC | PRN
  Administered 2023-07-28: 20 mg via INTRAVENOUS
  Administered 2023-07-28: 40 mg via INTRAVENOUS

## 2023-07-28 MED ORDER — LACTATED RINGERS IV SOLN: INTRAVENOUS | Status: DC

## 2023-07-28 MED ORDER — ACETAMINOPHEN 500 MG PO TABS
1000.0000 mg | ORAL_TABLET | Freq: Once | ORAL | Status: AC
  Administered 2023-07-28: 1000 mg via ORAL
  Filled 2023-07-28: qty 2

## 2023-07-28 MED ORDER — PHENYLEPHRINE HCL-NACL 20-0.9 MG/250ML-% IV SOLN
INTRAVENOUS | Status: DC | PRN
  Administered 2023-07-28: 35 ug/min via INTRAVENOUS

## 2023-07-28 MED ORDER — PROPOFOL 10 MG/ML IV BOLUS
INTRAVENOUS | Status: DC | PRN
  Administered 2023-07-28: 150 mg via INTRAVENOUS

## 2023-07-28 SURGICAL SUPPLY — 1 items: superlock fiducial marker IMPLANT

## 2023-07-28 NOTE — Discharge Instructions (Signed)
 Flexible Bronchoscopy, Care After This sheet gives you information about how to care for yourself after your test. Your doctor may also give you more specific instructions. If you have problems or questions, contact your doctor. Follow these instructions at home: Eating and drinking When your numbness is gone and your cough and gag reflexes have come back, you may: Eat only soft foods. Slowly drink liquids. When you get home after the test, go back to your normal diet. Driving Do not drive for 24 hours if you were given a medicine to help you relax (sedative). Do not drive or use heavy machinery while taking prescription pain medicine. General instructions  Take over-the-counter and prescription medicines only as told by your doctor. Return to your normal activities as told. Ask what activities are safe for you. Do not use any products that have nicotine  or tobacco in them. This includes cigarettes and e-cigarettes. If you need help quitting, ask your doctor. Keep all follow-up visits as told by your doctor. This is important. It is very important if you had a tissue sample (biopsy) taken. Get help right away if: You have shortness of breath that gets worse. You get light-headed. You feel like you are going to pass out (faint). You have chest pain. You cough up: More than a little blood. More blood than before. Summary Do not eat or drink anything (not even water) for 2 hours after your test, or until your numbing medicine wears off. Do not use cigarettes. Do not use e-cigarettes. Get help right away if you have chest pain.  Please call our office for any questions or concerns.  [phone]. Okay to restart your Eliquis  on 07/29/2023  This information is not intended to replace advice given to you by your health care provider. Make sure you discuss any questions you have with your health care provider. Document Released: 03/31/2009 Document Revised: 05/16/2017 Document Reviewed:  06/21/2016 Elsevier Patient Education  2020 [employer].

## 2023-07-28 NOTE — Interval H&P Note (Signed)
 History and Physical Interval Note:  07/28/2023 7:32 AM  Grant Flores  has presented today for surgery, with the diagnosis of LUNG NODULE BILATERAL.  The various methods of treatment have been discussed with the patient and family. After consideration of risks, benefits and other options for treatment, the patient has consented to  Procedure(s): ROBOTIC ASSISTED NAVIGATIONAL BRONCHOSCOPY (Bilateral) as a surgical intervention.  The patient's history has been reviewed, patient examined, no change in status, stable for surgery.  I have reviewed the patient's chart and labs.  Questions were answered to the patient's satisfaction.     Denson Flake

## 2023-07-28 NOTE — Op Note (Signed)
 Video Bronchoscopy with Robotic Assisted Bronchoscopic Navigation   Date of Operation: 07/28/2023   Pre-op Diagnosis: Left lower lobe mass, right upper lobe nodules  Post-op Diagnosis: Same  Surgeon: Racheal Buddle  Assistants: None  Anesthesia: General endotracheal anesthesia  Operation: Flexible video fiberoptic bronchoscopy with robotic assistance and biopsies.  Estimated Blood Loss: Minimal  Complications: None  Indications and History: Grant Flores is a 83 y.o. male with history of tobacco use, CAD, A-fib/flutter on anticoagulation.  He was found to have a cavitary left lower lobe mass in the superior segment as well as mixed density right upper lobe pulmonary nodule concerning for possible malignancy.  Recommendation made to achieve a tissue diagnosis via robotic assisted navigational bronchoscopy. The risks, benefits, complications, treatment options and expected outcomes were discussed with the patient.  The possibilities of pneumothorax, pneumonia, reaction to medication, pulmonary aspiration, perforation of a viscus, bleeding, failure to diagnose a condition and creating a complication requiring transfusion or operation were discussed with the patient who freely signed the consent.    Description of Procedure: The patient was seen in the Preoperative Area, was examined and was deemed appropriate to proceed.  The patient was taken to [facility] endoscopy room 3, identified as Grant Flores and the procedure verified as Flexible Video Fiberoptic Bronchoscopy.  A Time Out was held and the above information confirmed.   Prior to the date of the procedure a high-resolution CT scan of the chest was performed. Utilizing ION software program a virtual tracheobronchial tree was generated to allow the creation of distinct navigation pathways to the patient's parenchymal abnormalities. After being taken to the operating room general anesthesia was initiated and the patient  was orally intubated. The  video fiberoptic bronchoscope was introduced via the endotracheal tube and a general inspection was performed which showed narrowed airways throughout but overall normal right and left lung anatomy. Aspiration of the bilateral mainstems was completed to remove any remaining secretions. Robotic catheter inserted into patient's endotracheal tube.   Target #1 left lower lobe mass: The distinct navigation pathways prepared prior to this procedure were then utilized to navigate to patient's lesion identified on CT scan. The robotic catheter was secured into place and the vision probe was withdrawn.  Lesion location was approximated using fluoroscopy.  Local registration and targeting was performed using Cios three-dimensional imaging. Under fluoroscopic guidance transbronchial needle brushings, transbronchial needle biopsies, and transbronchial forceps biopsies were performed to be sent for cytology and pathology.  Needle in lesion was established using Cios.  Target #2 right upper lobe nodule: The distinct navigation pathways prepared prior to this procedure were then utilized to navigate to patient's lesion identified on CT scan. The robotic catheter was secured into place and the vision probe was withdrawn.  Lesion location was approximated using fluoroscopy.  Local registration and targeting was performed using Cios three-dimensional imaging. Under fluoroscopic guidance transbronchial brushings, transbronchial needle biopsies, and transbronchial forceps biopsies were performed to be sent for cytology and pathology.  Needle in lesion was established using Cios.  A bronchioalveolar lavage was performed in the right upper lobe and sent for microbiology.  Under fluoroscopic guidance a single fiducial marker was placed adjacent to the nodule.  At the end of the procedure a general airway inspection was performed and there was no evidence of active bleeding. The bronchoscope was removed.  The patient tolerated the  procedure well. There was no significant blood loss and there were no obvious complications. A post-procedural chest x-ray is pending.  Samples Target #1: 1. Transbronchial needle brushings from left lower lobe mass 2. Transbronchial Wang needle biopsies from left lower lobe mass 3. Transbronchial forceps biopsies from left lower lobe mass  Samples Target #2: 1. Transbronchial brushings from right upper lobe nodule 2. Transbronchial Wang needle biopsies from right upper lobe nodule 3. Transbronchial forceps biopsies from right upper lobe nodule 4. Bronchoalveolar lavage from right upper lobe  Plans:  The patient will be discharged from the PACU to home when recovered from anesthesia and after chest x-ray is reviewed. We will review the cytology, pathology and microbiology results with the patient when they become available. Outpatient followup will be with Braden Caddy, NP.   Racheal Buddle, MD, PhD 07/28/2023, 10:19 AM [facility] [phone] or if no answer before 7:00PM call [phone] For any issues after 7:00PM please call eLink [phone]

## 2023-07-28 NOTE — Anesthesia Postprocedure Evaluation (Signed)
 Anesthesia Post Note  Patient: Grant Flores  Procedure(s) Performed: ROBOTIC ASSISTED NAVIGATIONAL BRONCHOSCOPY (Bilateral) BRONCHIAL BIOPSIES BRONCHIAL BRUSHINGS BRONCHIAL NEEDLE ASPIRATION BIOPSIES BRONCHIAL WASHINGS FIDUCIAL MARKER PLACEMENT     Patient location during evaluation: PACU Anesthesia Type: General Level of consciousness: awake and alert Pain management: pain level controlled Vital Signs Assessment: post-procedure vital signs reviewed and stable Respiratory status: spontaneous breathing, nonlabored ventilation and respiratory function stable Cardiovascular status: blood pressure returned to baseline and stable Postop Assessment: no apparent nausea or vomiting Anesthetic complications: no  No notable events documented.  Last Vitals:  Vitals:   07/28/23 1130 07/28/23 1135  BP: (!) 91/55 (!) 101/58  Pulse: 76 76  Resp: (!) 6 (!) 25  Temp:    SpO2: 93% 94%    Last Pain:  Vitals:   07/28/23 1135  TempSrc:   PainSc: 0-No pain                 Miliana Gangwer,W. EDMOND

## 2023-07-28 NOTE — Transfer of Care (Signed)
 Immediate Anesthesia Transfer of Care Note  Patient: Schuyler Mclawhorn Morea  Procedure(s) Performed: ROBOTIC ASSISTED NAVIGATIONAL BRONCHOSCOPY (Bilateral) BRONCHIAL BIOPSIES BRONCHIAL BRUSHINGS BRONCHIAL NEEDLE ASPIRATION BIOPSIES BRONCHIAL WASHINGS FIDUCIAL MARKER PLACEMENT  Patient Location: PACU  Anesthesia Type:General  Level of Consciousness: drowsy and patient cooperative  Airway & Oxygen Therapy: Patient Spontanous Breathing and Patient connected to nasal cannula oxygen  Post-op Assessment: Report given to RN and Post -op Vital signs reviewed and stable  Post vital signs: Reviewed and stable  Last Vitals:  Vitals Value Taken Time  BP 95/62 07/28/23 1040  Temp    Pulse 72 07/28/23 1046  Resp 19 07/28/23 1046  SpO2 90 % 07/28/23 1046  Vitals shown include unfiled device data.  Last Pain:  Vitals:   07/28/23 0640  TempSrc:   PainSc: 0-No pain         Complications: No notable events documented.

## 2023-07-28 NOTE — Anesthesia Procedure Notes (Signed)
 Procedure Name: Intubation Date/Time: 07/28/2023 8:54 AM  Performed by: Katrinka Parr, CRNAPre-anesthesia Checklist: Patient identified, Emergency Drugs available, Suction available and Patient being monitored Patient Re-evaluated:Patient Re-evaluated prior to induction Oxygen Delivery Method: Circle System Utilized Preoxygenation: Pre-oxygenation with 100% oxygen Induction Type: IV induction Ventilation: Mask ventilation without difficulty Laryngoscope Size: Mac and 4 Grade View: Grade I Tube type: Oral Tube size: 8.5 mm Number of attempts: 1 Airway Equipment and Method: Stylet and Oral airway Placement Confirmation: ETT inserted through vocal cords under direct vision, positive ETCO2 and breath sounds checked- equal and bilateral Secured at: 23 cm Tube secured with: Tape Dental Injury: Teeth and Oropharynx as per pre-operative assessment

## 2023-07-29 ENCOUNTER — Ambulatory Visit: Payer: Medicare Other | Admitting: Acute Care

## 2023-07-30 LAB — ACID FAST SMEAR (AFB, MYCOBACTERIA): Acid Fast Smear: NEGATIVE

## 2023-07-30 LAB — CULTURE, BAL-QUANTITATIVE W GRAM STAIN

## 2023-07-30 LAB — CYTOLOGY - NON PAP

## 2023-07-31 LAB — CYTOLOGY - NON PAP

## 2023-08-02 LAB — AEROBIC/ANAEROBIC CULTURE W GRAM STAIN (SURGICAL/DEEP WOUND): Culture: NORMAL

## 2023-08-05 ENCOUNTER — Ambulatory Visit: Payer: Medicare Other | Admitting: Acute Care

## 2023-08-05 ENCOUNTER — Encounter: Payer: Self-pay | Admitting: Acute Care

## 2023-08-05 VITALS — BP 130/60 | HR 85 | Ht 69.0 in | Wt 210.0 lb

## 2023-08-05 DIAGNOSIS — C3480 Malignant neoplasm of overlapping sites of unspecified bronchus and lung: Secondary | ICD-10-CM

## 2023-08-05 DIAGNOSIS — Z9889 Other specified postprocedural states: Secondary | ICD-10-CM | POA: Diagnosis not present

## 2023-08-05 DIAGNOSIS — F17200 Nicotine dependence, unspecified, uncomplicated: Secondary | ICD-10-CM | POA: Diagnosis not present

## 2023-08-05 DIAGNOSIS — R9389 Abnormal findings on diagnostic imaging of other specified body structures: Secondary | ICD-10-CM | POA: Diagnosis not present

## 2023-08-05 DIAGNOSIS — C3490 Malignant neoplasm of unspecified part of unspecified bronchus or lung: Secondary | ICD-10-CM

## 2023-08-05 NOTE — Progress Notes (Signed)
 History of Present Illness Grant Flores is a 83 y.o. male  male current every day smoker with history of COPD, chronic heart failure, a flutter on blood thinner, severe LVH, chronic LE edema with ulcerations, Tobacco use, HTN, Hyperlipidemia , referred 06/2023 for pulmonary nodule. He will be followed by Dr. Delton Coombes.   Smokes 1.25 PPD x 56 years ( 70 pack year smoking history) He has COPD, but does not use his Spiriva daily.   Synopsis He has been having lung cancer screening by Dr. Pete Glatter since 11/2017.He initially had LR 3 scan, then in 12/2018 he had a LR 2 reading. Grant Flores did not have annual scanning after the 12/2018 scan.  Pt. Did have a CT Chest 06/2023, ordered by Lucile Crater PA-C. The scan was concerning for bilateral pulmonary nodules .    Grant Flores reported a long-standing smoking habit, with an estimated 56 pack-years. He was asymptomatic with no complaints of cough, hemoptysis, or weight loss. The Grant Flores's weight remained stable, and he denied any recent changes in his health status.   The Grant Flores had a history of heart disease, with stents placed in 2008, and a subsequent cervical fusion. He reported his health status to be similar to the time of these procedures. He had anesthesia last in 2008, and did not report any adverse effects. The Grant Flores also mentioned a history of working in [employer] before he retired. 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   In addition to his respiratory and cardiac conditions, the Grant Flores had leg ulcers causing significant discomfort, particularly at night. He has been  scheduled to see a wound care specialist for this issue in February.   The Grant Flores was on a regimen of Spiriva for his COPD, which he reported using inconsistently, sometimes skipping days as he felt he did not need it. He was also on Eliquis for anticoagulation.   We discussed the Grant Flores's CT scan results, and that they were concerning for malignancy,  especially with his smoking history. He does not feel this is a concern as he does not have any symptoms. We discussed that early stage cancer may not have symptoms.  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 Pt. Needs a EGD to better evaluate.  Pt. Underwent bronchoscopy with biopsies 07/28/2023 per Dr. Delton Coombes. He is here today to review cytology results and ensure he has done well post procedure.     08/05/2023 Pt.states he has done well post bronchoscopy with biopsies. He did have some scant blood the day of the procedure. This self resolved.  He denies any fever, discolored secretions, or dyspnea worse than his baseline.  He states he did not have any adverse reaction to the anesthesia.  We have reviewed the Grant Flores's cytology.  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  I explained that the cancer is in the left lower lobe, in the right upper lobe.  I have referred him to radiation oncology and also to medical oncology.  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  Grant Flores and his family did not have any significant questions  after we discussed the diagnosis.  I assured them that I have made the referrals an urgent basis so that we can get treatment decisions made and get Grant Flores treated.  Grant Flores is fairly frail, he is in a wheelchair today.  Depending on how the family feel regarding his treatment options I am unsure if they will opt for palliative care.  I did not broach the subject with them at this point as I do not feel like they have the information needed to determine what the plan may be.   Grant Flores did surprisingly well with the bronchoscopy procedure.  Test Results: Cytology 07/28/2023 FINAL MICROSCOPIC DIAGNOSIS:   A. LUNG, LLL TARGET 1, FINE NEEDLE ASPIRATION:  - Squamous cell carcinoma  - See comment   B. LUNG, LLL TARGET 1, BRUSHING:  - Rare atypical cells present    C. LUNG, RUL, FINE NEEDLE ASPIRATION:  - Non-small cell carcinoma  - See comment   D. LUNG, RUL, BRUSHING:  - Rare atypical cells present   Immunohistochemistry is performed to accurately classify the  non-small cell carcinoma.  The malignant cells positive p40 and  cytokeratin 5/6 and negative with TTF-1 consistent with squamous cell  carcinoma.  There is limited cellularity in the cellblock and of  borderline adequacy for molecular testing.     PET Scan 07/17/2023 Ill-defined sub solid nodular opacity in the right upper lobe measures 17 mm. This is hypermetabolic with SUV max of 3.08.   2.6 cm lesion in the superior segment of the left lower lobe medially and posterior demonstrates marked hypermetabolism with SUV max of 18.25. Findings consistent with primary lung neoplasm. 6 mm right paratracheal node has an SUV max of 3 point. This is likely reactive. No enlarged or hypermetabolic mediastinal hilar lymph nodes to suggest metastatic adenopathy.   No chest wall mass, supraclavicular or axillary adenopathy.   Moderate FDG uptake is noted in the left atrial appendage, left atrial wall and right atrial. This is typically seen with atrial fibrillation. Recommend clinical correlation. Impression 2.6 cm markedly hypermetabolic lesion in the superior segment of the left lower lobe consistent with primary lung neoplasm. 2. 17 mm sub solid nodular opacity in the right upper lobe is mildly hypermetabolic and is likely a synchronous adenocarcinoma. 3. No findings for metastatic disease involving the neck, chest, abdomen/pelvis or bony structures. 4. 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 Recommend endoscopic evaluation. 5. Moderate FDG uptake in the left atrial appendage, left atrial wall and right atrial wall. This is typically seen with atrial fibrillation.  CT Chest 06/05/2023 Lungs/Pleura: Centrilobular and paraseptal emphysema. New somewhat spiculated nodule in the superior segment left lower lobe measures 1.7 x 2.2 cm (3/53). An eccentrically thick-walled atypical pulmonary cyst in the apical segment right upper lobe (3/44), measures 1.6 x 2.1 cm, new from 12/21/2018. Ground-glass nodule in the apical segment right upper lobe measures 1.4 x 1.7 cm (3/37), also new. Spiculated apical left upper lobe nodule measures 6 mm (3/24), unchanged from 12/21/2018. No pleural fluid. Debris in the airway.       Latest Ref Rng & Units 07/28/2023    6:46 AM 05/30/2023   10:10 AM 10/24/2021    4:38 PM  CBC  WBC 4.0 - 10.5 K/uL 11.7  8.6  9.7   Hemoglobin 13.0 - 17.0 g/dL 95.6  21.3  08.6   Hematocrit 39.0 - 52.0 % 46.4  45.8  47.1   Platelets  150 - 400 K/uL 263  229  258        Latest Ref Rng & Units 07/28/2023    6:46 AM 06/12/2023    1:59 PM 05/30/2023   10:10 AM  BMP  Glucose 70 - 99 mg/dL 96  79  91   BUN 8 - 23 mg/dL 5  9  8    Creatinine 0.61 - 1.24 mg/dL 9.14  7.82  9.56   BUN/Creat Ratio 10 - 24  9  8    Sodium 135 - 145 mmol/L 137  141  139   Potassium 3.5 - 5.1 mmol/L 3.6  3.8  3.7   Chloride 98 - 111 mmol/L 103  102  102   CO2 22 - 32 mmol/L 24  22  25    Calcium 8.9 - 10.3 mg/dL 8.5  8.8  8.7     BNP No results found for: "BNP"  ProBNP    Component Value Date/Time   PROBNP 724 (H) 05/19/2023 1121    PFT No results found for: "FEV1PRE", "FEV1POST", "FVCPRE", "FVCPOST", "TLC", "DLCOUNC", "PREFEV1FVCRT", "PSTFEV1FVCRT"  DG Chest Port 1 View Result Date: 07/28/2023 CLINICAL DATA:  Status post bronchoscopy. EXAM: PORTABLE CHEST 1 VIEW COMPARISON:  X-ray 03/16/2014 FINDINGS: Enlarged  cardiopericardial silhouette. Prominence of central vasculature. No pneumothorax, effusion. Bronchial wall thickening. New marker at the right upper lung with some vague nodularity. Please correlate for history of lung lesions best seen on prior cross-sectional imaging including PET-CT scan of 07/17/2023. IMPRESSION: New right upper lobe lung marker. No pneumothorax or effusion post bronchoscopy. Enlarged heart with increasing vascular congestion and interstitial changes. Recommend follow-up Electronically Signed   By: Karen Kays M.D.   On: 07/28/2023 13:17   DG C-ARM BRONCHOSCOPY Result Date: 07/28/2023 C-ARM BRONCHOSCOPY: Fluoroscopy was utilized by the requesting physician.  No radiographic interpretation.   DG C-Arm 1-60 Min-No Report Result Date: 07/28/2023 Fluoroscopy was utilized by the requesting physician.  No radiographic interpretation.   NM PET Image Initial (PI) Skull Base To Thigh Result Date: 07/27/2023 CLINICAL DATA:  Initial treatment strategy for bilateral lung lesions. EXAM: NUCLEAR MEDICINE PET SKULL BASE TO THIGH TECHNIQUE: 10.32 mCi F-18 FDG was injected intravenously. Full-ring PET imaging was performed from the skull base to thigh after the radiotracer. CT data was obtained and used for attenuation correction and anatomic localization. Fasting blood glucose: 87 mg/dl COMPARISON:  Multiple prior chest CTs. FINDINGS: Mediastinal blood pool activity: SUV max 1.99 Liver activity: SUV max NA NECK: No hypermetabolic lymph nodes in the neck. Incidental CT findings: Bilateral carotid artery calcifications. CHEST: Ill-defined sub solid nodular opacity in the right upper lobe measures 17 mm. This is hypermetabolic with SUV max of 3.08. 2.6 cm lesion in the superior segment of the left lower lobe medially and posterior demonstrates marked hypermetabolism with SUV max of 18.25. Findings consistent with primary lung neoplasm. 6 mm right paratracheal node has an SUV max of 3 point. This is likely  reactive. No enlarged or hypermetabolic mediastinal hilar lymph nodes to suggest metastatic adenopathy. No chest wall mass, supraclavicular or axillary adenopathy. Moderate FDG uptake is noted in the left atrial appendage, left atrial wall and right atrial. This is typically seen with atrial fibrillation. Recommend clinical correlation. Incidental CT findings: Stable underlying emphysematous changes and pulmonary scarring. No other pulmonary nodules or lesions are identified. Stable cardiac enlargement and aortic and coronary artery calcifications. ABDOMEN/PELVIS: There is a hypermetabolic lesion in the antral region of the stomach. This corresponds to a 16 mm  soft tissue lesion. SUV max is 23.85. Findings highly concerning for gastric carcinoma. Recommend endoscopic evaluation. No findings for metastatic disease involving liver, spleen, pancreas, adrenal glands or kidneys. No enlarged or hypermetabolic abdominal or pelvic lymph nodes. Incidental CT findings: Advanced atherosclerotic calcification involving the aorta and iliac arteries but no aneurysm. Small gallstone noted in the gallbladder. Calcified lesion central pelvic fat could be a remote infarct or calcified lymph node. Small inguinal hernias containing fat. SKELETON: Uptake around the left hip is likely arthropathic change. No definite bone lesions. Incidental CT findings: None. IMPRESSION: 1. 2.6 cm markedly hypermetabolic lesion in the superior segment of the left lower lobe consistent with primary lung neoplasm. 2. 17 mm sub solid nodular opacity in the right upper lobe is mildly hypermetabolic and is likely a synchronous adenocarcinoma. 3. No findings for metastatic disease involving the neck, chest, abdomen/pelvis or bony structures. 4. 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 Recommend endoscopic evaluation. 5. Moderate FDG uptake in the left atrial appendage, left atrial wall and right  atrial wall. This is typically seen with atrial fibrillation. Electronically Signed   By: Rudie Meyer M.D.   On: 07/27/2023 19:30     Past medical hx Past Medical History:  Diagnosis Date   Asthma    CAD (coronary artery disease) 03/18/2007   s/p inferolateral STEMI.... PCI with BMS to LCX. (cath with EF 50%, mod inf HK, RCA 40%, small oD3 70%, OM1 40%, CFX occluded - tx with PCI);  echo 10/08: EF 45%, mild RVE;  Myoview 2009. Inferior infarct. no ischemia, EF 45%   COPD (chronic obstructive pulmonary disease) (HCC)    with ongoing tobacco   High cholesterol    Hypertension    Myocardial infarction (HCC) 06/17/2006   Obesity    Shortness of breath    with exertion      Social History   Tobacco Use   Smoking status: Every Day    Current packs/day: 1.00    Types: Cigarettes   Smokeless tobacco: Never   Tobacco comments:    Grant Flores stated that he has gone down to 1 pack a day  Substance Use Topics   Alcohol use: No   Drug use: No    GrantFlores reports that he has been smoking cigarettes. He has never used smokeless tobacco. He reports that he does not drink alcohol and does not use drugs.  Tobacco Cessation: Ready to quit: Not Answered Counseling given: Not Answered Tobacco comments: Grant Flores stated that he has gone down to 1 pack a day Current everyday smoker. Grant Flores has been counseled to quit completely.  Past surgical hx, Family hx, Social hx all reviewed.  Current Outpatient Medications on File Prior to Visit  Medication Sig   apixaban (ELIQUIS) 5 MG TABS tablet Take 1 tablet (5 mg total) by mouth 2 (two) times daily. Okay to restart this medication on 07/29/2023   atorvastatin (LIPITOR) 80 MG tablet TAKE 1 TABLET BY MOUTH ONCE  DAILY   azithromycin (ZITHROMAX Z-PAK) 250 MG tablet Take 2 tablets ( 500 mg total) Day 1 Take 1 tablet ( 250 mg)  Day 2 through 5   carvedilol (COREG) 6.25 MG tablet TAKE 1 TABLET BY MOUTH TWICE  DAILY WITH MEALS   losartan (COZAAR) 25 MG  tablet Take 1 tablet (25 mg total) by mouth daily.   potassium chloride (KLOR-CON) 10 MEQ tablet Take 2 tablets (20 mEq total) by mouth 2 (two)  times daily.   SPIRIVA RESPIMAT 2.5 MCG/ACT AERS Inhale into the lungs as needed.   torsemide (DEMADEX) 20 MG tablet Take 2 tablets (40 mg total) by mouth 2 (two) times daily.   No current facility-administered medications on file prior to visit.     No Known Allergies  Review Of Systems:  Constitutional:   No  weight loss, night sweats,  Fevers, chills, +fatigue, or  lassitude.  HEENT:   No headaches,  Difficulty swallowing,  Tooth/dental problems, or  Sore throat,                No sneezing, itching, ear ache, nasal congestion, post nasal drip,   CV:  No chest pain,  Orthopnea, PND, ++swelling in lower extremities, no anasarca, dizziness, palpitations, syncope.   GI  No heartburn, indigestion, abdominal pain, nausea, vomiting, diarrhea, change in bowel habits, loss of appetite, bloody stools.   Resp:  + shortness of breath with exertion less at rest.  Positive baseline excess mucus, positive baseline productive cough,  No non-productive cough,  No coughing up of blood.  No change in color of mucus.  No wheezing.  No chest wall deformity  Skin: no rash or lesions.  GU: no dysuria, change in color of urine, no urgency or frequency.  No flank pain, no hematuria   MS:  + joint pain or swelling.  + decreased range of motion.  No back pain.  Psych:  No change in mood or affect. No depression or anxiety.  No memory loss.   Vital Signs BP 130/60 (BP Location: Left Arm, Grant Flores Position: Sitting, Cuff Size: Large)   Pulse 85   Ht 5\' 9"  (1.753 m)   Wt 210 lb (95.3 kg)   SpO2 98%   BMI 31.01 kg/m    Physical Exam:  General- No distress,  A&Ox3, pleasant elderly gentleman in a wheelchair ENT: No sinus tenderness, TM clear, pale nasal mucosa, no oral exudate,no post nasal drip, no LAN Cardiac: S1, S2, regular rate and rhythm, no  murmur Chest: No wheeze/ rales/ dullness; no accessory muscle use, no nasal flaring, no sternal retractions, rhonchi throughout, diminished in the bases Abd.: Soft Non-tender, nondistended, bowel sounds positive,Body mass index is 31.01 kg/m.  Ext: No clubbing cyanosis, edema, positive bilateral lower extremity edema with weeping, no obvious deformities Neuro: Physical deconditioning, alert and oriented x 3, appropriate Skin: No rashes, warm and dry, no obvious lesions Psych: normal mood and behavior   Assessment/Plan New diagnosis Squamous Cell Lung Cancer Post bronchoscopy with biopsies Two nodules identified in left lower lobe and right upper lobe, both positive for squamous cell cancer. No symptoms of fever, discolored secretions, or increased shortness of breath. -Refer to Radiation Oncology for consultation regarding potential treatment. -Refer to Medical Oncology for consultation regarding potential treatment. -Order MRI of the brain to complete staging.  Abnormal PET Scan Finding in Stomach An area in the stomach was identified on the PET scan. No recent endoscopy or GI doctor. -Refer to Gastroenterology for endoscopy to evaluate the finding in the stomach.  Hemoptysis, post bronchoscopy Brief episode of coughing up blood for about a day and a half, self-resolved. No associated fever or change in secretions. -Monitor symptoms. If reoccurs, consider further evaluation.  I spent 35 minutes dedicated to the care of this Grant Flores on the date of this encounter to include pre-visit review of records, face-to-face time with the Grant Flores discussing conditions above, post visit ordering of testing, clinical documentation with the electronic health record,  making appropriate referrals as documented, and communicating necessary information to the Grant Flores's healthcare team.    Bevelyn Ngo, NP 08/05/2023  10:24 AM

## 2023-08-05 NOTE — Progress Notes (Signed)
 Thoracic Location of Tumor / Histology: Right upper lobe/Left lower lobe nodule  Patient presented as referral from Dr. Levy Pupa [facility]).  07/28/2023 Dr. Levy Pupa DG Chest Port 1 View CLINICAL DATA: Status post bronchoscopy.   FINDINGS: Enlarged cardiopericardial silhouette. Prominence of central vasculature. No pneumothorax, effusion. Bronchial wall thickening. New marker at the right upper lung with some vague nodularity. Please correlate for history of lung lesions best seen on prior cross-sectional imaging including PET-CT scan of 07/17/2023.   IMPRESSION: New right upper lobe lung marker. No pneumothorax or effusion post bronchoscopy.  Enlarged heart with increasing vascular congestion and interstitial changes. Recommend follow-up   07/17/2023 [name], NP NM PET Image Initial (PI) Skull Base to Thigh CLINICAL DATA:  Initial treatment strategy for bilateral lung lesions.  IMPRESSION: 1. 2.6 cm markedly hypermetabolic lesion in the superior segment of the left lower lobe consistent with primary lung neoplasm. 2. 17 mm sub solid nodular opacity in the right upper lobe is mildly hypermetabolic and is likely a synchronous adenocarcinoma. 3. No findings for metastatic disease involving the neck, chest, abdomen/pelvis or bony structures. 4. 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 Recommend endoscopic evaluation. 5. Moderate FDG uptake in the left atrial appendage, left atrial wall and right atrial wall. This is typically seen with atrial fibrillation.   06/05/2023 [name], PA-C CT Chest without Contrast CLINICAL DATA: Pulmonary nodule.   IMPRESSION: 1. New solid spiculated left lower lobe nodule, indicative of primary bronchogenic carcinoma. 2. Atypical pulmonary cystic and ground-glass nodule in the right upper lobe, worrisome for lower grade adenocarcinomas. 3. These results  will be called to the ordering clinician or representative by the Radiologist Assistant, and communication documented in the PACS or [employer]. 4. Liver may be steatotic. 5. Aortic atherosclerosis (ICD10-I70.0). Coronary artery calcification. 6. Enlarged pulmonic trunk, indicative of pulmonary arterial hypertension. 7.  Emphysema (ICD10-J43.9).   Past/Anticipated interventions by cardiothoracic surgery, if any:  07/28/2023 Dr. Levy Pupa Robotic Assisted Navigational Bronchoscopy  Past/Anticipated interventions by medical oncology, if any: NA  Tobacco/Marijuana/Snuff/ETOH use: Smokes everyday, no alcohol or drug use.  Signs/Symptoms Weight changes, if any:  No Respiratory complaints, if any: SOB with exertion.  Cough. Hemoptysis, if any: No Pain issues, if any:  0/10  SAFETY ISSUES: Prior radiation? No Pacemaker/ICD?  No Possible current pregnancy? Male Is the patient on methotrexate? No  Current Complaints / other details:

## 2023-08-05 NOTE — Patient Instructions (Addendum)
 It is good to see you today. I am glad you have  done well after the procedure.  The biopsy of your right upper lobe and left lower lobe was positive for squamous cell lung cancer. This is a non small cell lung cancer. I have referred you to medical oncology and radiation oncology for consults to discuss treatment options. I have also referred you to GI, to have them evaluate the finding in the stomach on your PET scan.  I have also ordered an MRI of the brain to complete staging of your cancer.  Daisey Must with your treatment. The staff at the cancer center will take great care of you.  Please call us if you have any questions Please contact office for sooner follow up if symptoms do not improve or worsen or seek emergency care

## 2023-08-07 ENCOUNTER — Other Ambulatory Visit: Payer: Self-pay

## 2023-08-07 NOTE — Progress Notes (Signed)
 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 Malignant neoplasm of right upper lobe of lung (HCC)  C34.11     2. Malignant neoplasm of lower lobe bronchus, left (HCC)  C34.32       HISTORY OF PRESENT ILLNESS: Grant Flores is a 83 y.o. male seen at the request of Dr. Delton Coombes. He has a long-standing history of smoking and was initially enrolled in lung cancer screening in 11/2017. He was found to have Lung-RADS 3, probably benign findings, and was recommended to return for short-term follow up. Repeat chest CT scans in 05/2018 and 12/2018 were benign, and he was recommended to continue annual follow up. For whatever reason, he did not return for repeat until 2024. Chest CT on 06/05/23, ordered by his cardiologist, showed: new solid left lower lobe nodule, indicative of primary bronchogenic carcinoma; atypical pulmonary cystic and ground-glass nodule in right upper lobe, worrisome for lower grade adenocarcinomas. 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  He proceeded to bronchoscopy with biopsies on 07/28/23 under Dr. Delton Coombes. 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  PREVIOUS RADIATION THERAPY: No  PAST MEDICAL HISTORY:  Past Medical History:   Diagnosis Date   Asthma    CAD (coronary artery disease) 03/18/2007   s/p inferolateral STEMI.... PCI with BMS to LCX. (cath with EF 50%, mod inf HK, RCA 40%, small oD3 70%, OM1 40%, CFX occluded - tx with PCI);  echo 10/08: EF 45%, mild RVE;  Myoview 2009. Inferior infarct. no ischemia, EF 45%   COPD (chronic obstructive pulmonary disease) (HCC)    with ongoing tobacco   High cholesterol    Hypertension    Myocardial infarction (HCC) 06/17/2006   Obesity    Shortness of breath    with exertion       PAST SURGICAL HISTORY: Past Surgical History:  Procedure Laterality Date   ANTERIOR CERVICAL DECOMP/DISCECTOMY FUSION  before 2008   APPENDECTOMY  age 71   ARTHRODESIS     C4-C6   Bare Metal Stent     BRONCHIAL BIOPSY  07/28/2023   Procedure: BRONCHIAL BIOPSIES;  Surgeon: Leslye Peer, MD;  Location: [facility] ENDOSCOPY;  Service: Pulmonary;;   BRONCHIAL BRUSHINGS  07/28/2023   Procedure: BRONCHIAL BRUSHINGS;  Surgeon: Leslye Peer, MD;  Location: [facility] ENDOSCOPY;  Service: Pulmonary;;   BRONCHIAL NEEDLE ASPIRATION BIOPSY  07/28/2023   Procedure: BRONCHIAL NEEDLE ASPIRATION BIOPSIES;  Surgeon: Leslye Peer, MD;  Location: [facility] ENDOSCOPY;  Service: Pulmonary;;   BRONCHIAL WASHINGS  07/28/2023   Procedure: BRONCHIAL WASHINGS;  Surgeon: Leslye Peer, MD;  Location: [facility] ENDOSCOPY;  Service: Pulmonary;;   CARDIAC CATHETERIZATION  2008   cardiologist: Dr. Sharlet Salina    CARDIOVASCULAR STRESS TEST  04/03/2011   CARPAL TUNNEL RELEASE  unknown  Bilateral    FIDUCIAL MARKER PLACEMENT  07/28/2023   Procedure: FIDUCIAL MARKER PLACEMENT;  Surgeon: Leslye Peer, MD;  Location: [facility] ENDOSCOPY;  Service: Pulmonary;;   TOTAL KNEE ARTHROPLASTY  05/24/2011   Procedure: TOTAL KNEE ARTHROPLASTY;  Surgeon: Thera Flake., MD;  Location: [facility] OR;  Service: Orthopedics;  Laterality: Right;  RIGHT ARTHROSCOPY TOTAL KNEE    FAMILY HISTORY:  Family History  Problem Relation Age of Onset   Heart attack Father 50    Coronary artery disease Neg Hx     SOCIAL HISTORY:  Social History   Socioeconomic History   Marital status: Married    Spouse name: Not on file   Number of children: Not on file   Years of education: Not on file   Highest education level: Not on file  Occupational History   Not on file  Tobacco Use   Smoking status: Every Day    Current packs/day: 1.00    Types: Cigarettes   Smokeless tobacco: Never   Tobacco comments:    Patient stated that he has gone down to 1 pack a day  Substance and Sexual Activity   Alcohol use: No   Drug use: No   Sexual activity: Not on file  Other Topics Concern   Not on file  Social History Narrative   Not on file   Social Drivers of Health   Financial Resource Strain: Not on file  Food Insecurity: No Food Insecurity (08/11/2023)   Hunger Vital Sign    Worried About Running Out of Food in the Last Year: Never true    Ran Out of Food in the Last Year: Never true  Transportation Needs: No Transportation Needs (08/11/2023)   PRAPARE - Administrator, Civil Service (Medical): No    Lack of Transportation (Non-Medical): No  Physical Activity: Not on file  Stress: Not on file  Social Connections: Not on file  Intimate Partner Violence: Not At Risk (08/11/2023)   Humiliation, Afraid, Rape, and Kick questionnaire    Fear of Current or Ex-Partner: No    Emotionally Abused: No    Physically Abused: No    Sexually Abused: No    ALLERGIES: Patient has no known allergies.  MEDICATIONS:  Current Outpatient Medications  Medication Sig Dispense Refill   apixaban (ELIQUIS) 5 MG TABS tablet Take 1 tablet (5 mg total) by mouth 2 (two) times daily. Okay to restart this medication on 07/29/2023     atorvastatin (LIPITOR) 80 MG tablet TAKE 1 TABLET BY MOUTH ONCE  DAILY 100 tablet 2   azithromycin (ZITHROMAX Z-PAK) 250 MG tablet Take 2 tablets ( 500 mg total) Day 1 Take 1 tablet ( 250 mg)  Day 2 through 5 6 each 0   carvedilol (COREG) 6.25  MG tablet TAKE 1 TABLET BY MOUTH TWICE  DAILY WITH MEALS 200 tablet 2   losartan (COZAAR) 25 MG tablet Take 1 tablet (25 mg total) by mouth daily. 90 tablet 3   potassium chloride (KLOR-CON) 10 MEQ tablet Take 2 tablets (20 mEq total) by mouth 2 (two) times daily. 120 tablet 6   SPIRIVA RESPIMAT 2.5 MCG/ACT AERS Inhale into the lungs as needed.     torsemide (DEMADEX) 20 MG tablet Take 2 tablets (40 mg total) by mouth 2 (two) times daily. 120 tablet 6   No current facility-administered medications for this encounter.    REVIEW OF SYSTEMS:  On review of systems, the patient reports that he is doing  well overall. He denies any chest pain, shortness of breath, cough, fevers, chills, night sweats, unintended weight changes. He denies any bowel or bladder disturbances, and denies abdominal pain, nausea or vomiting. He denies any new musculoskeletal or joint aches or pains. A complete review of systems is obtained and is otherwise negative.    PHYSICAL EXAM:  Wt Readings from Last 3 Encounters:  08/11/23 210 lb (95.3 kg)  08/05/23 210 lb (95.3 kg)  07/28/23 210 lb (95.3 kg)   Temp Readings from Last 3 Encounters:  07/28/23 97.7 F (36.5 C) (Temporal)  07/04/23 97.7 F (36.5 C) (Oral)  05/27/11 99 F (37.2 C) (Oral)   BP Readings from Last 3 Encounters:  08/05/23 130/60  07/28/23 (!) 101/58  07/04/23 130/82   Pulse Readings from Last 3 Encounters:  08/05/23 85  07/28/23 76  07/04/23 (!) 55   Pain Assessment Pain Score: 0-No pain/10  Physical exam not performed in light of telephone encounter.   KPS = 90  100 - Normal; no complaints; no evidence of disease. 90   - Able to carry on normal activity; minor signs or symptoms of disease. 80   - Normal activity with effort; some signs or symptoms of disease. 14   - Cares for self; unable to carry on normal activity or to do active work. 60   - Requires occasional assistance, but is able to care for most of his personal needs. 50    - Requires considerable assistance and frequent medical care. 40   - Disabled; requires special care and assistance. 30   - Severely disabled; hospital admission is indicated although death not imminent. 20   - Very sick; hospital admission necessary; active supportive treatment necessary. 10   - Moribund; fatal processes progressing rapidly. 0     - Dead  Karnofsky DA, Abelmann WH, Craver LS and [facility] [phone]) The use of the nitrogen mustards in the palliative treatment of carcinoma: with particular reference to bronchogenic carcinoma Cancer 1 634-56  LABORATORY DATA:  Lab Results  Component Value Date   WBC 11.7 (H) 07/28/2023   HGB 14.9 07/28/2023   HCT 46.4 07/28/2023   MCV 92.2 07/28/2023   PLT 263 07/28/2023   Lab Results  Component Value Date   NA 137 07/28/2023   K 3.6 07/28/2023   CL 103 07/28/2023   CO2 24 07/28/2023   Lab Results  Component Value Date   ALT 13 05/30/2023   AST 17 05/30/2023   ALKPHOS 99 05/30/2023   BILITOT 0.9 05/30/2023     RADIOGRAPHY: DG Chest Port 1 View Result Date: 07/28/2023 CLINICAL DATA:  Status post bronchoscopy. EXAM: PORTABLE CHEST 1 VIEW COMPARISON:  X-ray 03/16/2014 FINDINGS: Enlarged cardiopericardial silhouette. Prominence of central vasculature. No pneumothorax, effusion. Bronchial wall thickening. New marker at the right upper lung with some vague nodularity. Please correlate for history of lung lesions best seen on prior cross-sectional imaging including PET-CT scan of 07/17/2023. IMPRESSION: New right upper lobe lung marker. No pneumothorax or effusion post bronchoscopy. Enlarged heart with increasing vascular congestion and interstitial changes. Recommend follow-up Electronically Signed   By: Karen Kays M.D.   On: 07/28/2023 13:17   DG C-ARM BRONCHOSCOPY Result Date: 07/28/2023 C-ARM BRONCHOSCOPY: Fluoroscopy was utilized by the requesting physician.  No radiographic interpretation.   DG C-Arm 1-60 Min-No Report Result  Date: 07/28/2023 Fluoroscopy was utilized by the requesting physician.  No radiographic interpretation.   NM PET Image Initial (PI) Skull Base To Thigh Result  Date: 07/27/2023 CLINICAL DATA:  Initial treatment strategy for bilateral lung lesions. EXAM: NUCLEAR MEDICINE PET SKULL BASE TO THIGH TECHNIQUE: 10.32 mCi F-18 FDG was injected intravenously. Full-ring PET imaging was performed from the skull base to thigh after the radiotracer. CT data was obtained and used for attenuation correction and anatomic localization. Fasting blood glucose: 87 mg/dl COMPARISON:  Multiple prior chest CTs. FINDINGS: Mediastinal blood pool activity: SUV max 1.99 Liver activity: SUV max NA NECK: No hypermetabolic lymph nodes in the neck. Incidental CT findings: Bilateral carotid artery calcifications. CHEST: Ill-defined sub solid nodular opacity in the right upper lobe measures 17 mm. This is hypermetabolic with SUV max of 3.08. 2.6 cm lesion in the superior segment of the left lower lobe medially and posterior demonstrates marked hypermetabolism with SUV max of 18.25. Findings consistent with primary lung neoplasm. 6 mm right paratracheal node has an SUV max of 3 point. This is likely reactive. No enlarged or hypermetabolic mediastinal hilar lymph nodes to suggest metastatic adenopathy. No chest wall mass, supraclavicular or axillary adenopathy. Moderate FDG uptake is noted in the left atrial appendage, left atrial wall and right atrial. This is typically seen with atrial fibrillation. Recommend clinical correlation. Incidental CT findings: Stable underlying emphysematous changes and pulmonary scarring. No other pulmonary nodules or lesions are identified. Stable cardiac enlargement and aortic and coronary artery calcifications. ABDOMEN/PELVIS: There is a hypermetabolic lesion in the antral region of the stomach. This corresponds to a 16 mm soft tissue lesion. SUV max is 23.85. Findings highly concerning for gastric carcinoma.  Recommend endoscopic evaluation. No findings for metastatic disease involving liver, spleen, pancreas, adrenal glands or kidneys. No enlarged or hypermetabolic abdominal or pelvic lymph nodes. Incidental CT findings: Advanced atherosclerotic calcification involving the aorta and iliac arteries but no aneurysm. Small gallstone noted in the gallbladder. Calcified lesion central pelvic fat could be a remote infarct or calcified lymph node. Small inguinal hernias containing fat. SKELETON: Uptake around the left hip is likely arthropathic change. No definite bone lesions. Incidental CT findings: None. IMPRESSION: 1. 2.6 cm markedly hypermetabolic lesion in the superior segment of the left lower lobe consistent with primary lung neoplasm. 2. 17 mm sub solid nodular opacity in the right upper lobe is mildly hypermetabolic and is likely a synchronous adenocarcinoma. 3. No findings for metastatic disease involving the neck, chest, abdomen/pelvis or bony structures. 4. 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 Recommend endoscopic evaluation. 5. Moderate FDG uptake in the left atrial appendage, left atrial wall and right atrial wall. This is typically seen with atrial fibrillation. Electronically Signed   By: Rudie Meyer M.D.   On: 07/27/2023 19:30      IMPRESSION/PLAN: 1. 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  He is not an ideal surgical candidate medically.  He is a good candidate for SBRT.  Today, we talked to the patient and family about the findings and workup thus far. We discussed the natural history of non-small cell carcinoma and general treatment, highlighting the role of radiotherapy in the management. We discussed the available radiation techniques, and focused on the details and logistics of delivery. We reviewed the anticipated acute and late sequelae associated with radiation in this setting. The patient was encouraged to ask  questions that were answered to his satisfaction.  At the end of our conversation, the patient will present next Tue 3/4 at 1 pm for SBRT simulation, anticipate 3 fractions to RUL and  5 fractions to more central LLL target.   This encounter was conducted via telephone. The patient was notified in advance and was offered a WebEX meeting to allow for face to face communication but unfortunately reported that he did not have the appropriate resources/technology to support such a visit and instead preferred to proceed with telephone consult. The patient has given verbal consent for this type of encounter.   I personally spent 60 minutes in this encounter including chart review, reviewing radiological studies, meeting face-to-face with the patient, entering orders and completing documentation.   The attendants for this meeting include Margaretmary Dys MD, patient Grant Flores and his wife During the encounter, Margaretmary Dys MD was located at [facility] Radiation Oncology Department.  Patient Grant Flores and his wife were located at home.       Margaretmary Dys, MD  [facility]  Radiation Oncology Direct Dial: [phone]  Fax: [phone] [email or web address]  Skype  LinkedIn   This document serves as a record of services personally performed by Margaretmary Dys, MD and Marcello Fennel, PA-C. It was created on their behalf by Mickie Bail, a trained medical scribe. The creation of this record is based on the scribe's personal observations and the provider's statements to them. This document has been checked and approved by the attending provider.

## 2023-08-08 NOTE — Progress Notes (Signed)
 The proposed treatment discussed in conference is for discussion purpose only and is not a binding recommendation.  The patients have not been physically examined, or presented with their treatment options.  Therefore, final treatment plans cannot be decided.

## 2023-08-11 ENCOUNTER — Encounter: Payer: Self-pay | Admitting: Radiation Oncology

## 2023-08-11 ENCOUNTER — Ambulatory Visit
Admission: RE | Admit: 2023-08-11 | Discharge: 2023-08-11 | Disposition: A | Discharge status: Home / Self Care | Payer: Medicare Other | Source: Ambulatory Visit | Attending: Radiation Oncology | Admitting: Radiation Oncology

## 2023-08-11 ENCOUNTER — Other Ambulatory Visit: Payer: Self-pay

## 2023-08-11 VITALS — Ht 69.0 in | Wt 210.0 lb

## 2023-08-11 DIAGNOSIS — F17210 Nicotine dependence, cigarettes, uncomplicated: Secondary | ICD-10-CM | POA: Diagnosis not present

## 2023-08-11 DIAGNOSIS — C3411 Malignant neoplasm of upper lobe, right bronchus or lung: Secondary | ICD-10-CM | POA: Insufficient documentation

## 2023-08-11 DIAGNOSIS — C3432 Malignant neoplasm of lower lobe, left bronchus or lung: Secondary | ICD-10-CM | POA: Insufficient documentation

## 2023-08-12 LAB — MOLECULAR PATHOLOGY

## 2023-08-14 ENCOUNTER — Encounter (HOSPITAL_BASED_OUTPATIENT_CLINIC_OR_DEPARTMENT_OTHER): Payer: Medicare Other | Attending: Internal Medicine | Admitting: Internal Medicine

## 2023-08-14 DIAGNOSIS — C3432 Malignant neoplasm of lower lobe, left bronchus or lung: Secondary | ICD-10-CM | POA: Insufficient documentation

## 2023-08-14 DIAGNOSIS — L97822 Non-pressure chronic ulcer of other part of left lower leg with fat layer exposed: Secondary | ICD-10-CM | POA: Insufficient documentation

## 2023-08-14 DIAGNOSIS — C3411 Malignant neoplasm of upper lobe, right bronchus or lung: Secondary | ICD-10-CM | POA: Diagnosis not present

## 2023-08-14 DIAGNOSIS — J449 Chronic obstructive pulmonary disease, unspecified: Secondary | ICD-10-CM | POA: Diagnosis not present

## 2023-08-14 DIAGNOSIS — I5032 Chronic diastolic (congestive) heart failure: Secondary | ICD-10-CM | POA: Insufficient documentation

## 2023-08-14 DIAGNOSIS — F17200 Nicotine dependence, unspecified, uncomplicated: Secondary | ICD-10-CM | POA: Diagnosis not present

## 2023-08-14 DIAGNOSIS — I87312 Chronic venous hypertension (idiopathic) with ulcer of left lower extremity: Secondary | ICD-10-CM | POA: Insufficient documentation

## 2023-08-15 ENCOUNTER — Ambulatory Visit (HOSPITAL_COMMUNITY)
Admission: RE | Admit: 2023-08-15 | Discharge: 2023-08-15 | Disposition: A | Discharge status: Home / Self Care | Payer: Medicare Other | Source: Ambulatory Visit | Attending: Acute Care | Admitting: Acute Care

## 2023-08-15 ENCOUNTER — Encounter (HOSPITAL_COMMUNITY): Payer: Self-pay

## 2023-08-15 DIAGNOSIS — G9389 Other specified disorders of brain: Secondary | ICD-10-CM | POA: Diagnosis not present

## 2023-08-15 DIAGNOSIS — C3490 Malignant neoplasm of unspecified part of unspecified bronchus or lung: Secondary | ICD-10-CM | POA: Insufficient documentation

## 2023-08-15 DIAGNOSIS — G930 Cerebral cysts: Secondary | ICD-10-CM | POA: Diagnosis not present

## 2023-08-15 MED ORDER — GADOBUTROL 1 MMOL/ML IV SOLN
9.0000 mL | Freq: Once | INTRAVENOUS | Status: AC | PRN
Start: 2023-08-15 — End: 2023-08-15
  Administered 2023-08-15: 9 mL via INTRAVENOUS

## 2023-08-18 ENCOUNTER — Other Ambulatory Visit: Payer: Self-pay | Admitting: Medical Oncology

## 2023-08-18 DIAGNOSIS — C3411 Malignant neoplasm of upper lobe, right bronchus or lung: Secondary | ICD-10-CM

## 2023-08-19 ENCOUNTER — Inpatient Hospital Stay: Payer: Medicare Other | Admitting: Internal Medicine

## 2023-08-19 ENCOUNTER — Ambulatory Visit
Admission: RE | Admit: 2023-08-19 | Discharge: 2023-08-19 | Disposition: A | Discharge status: Home / Self Care | Payer: Medicare Other | Source: Ambulatory Visit | Attending: Urology | Admitting: Urology

## 2023-08-19 ENCOUNTER — Inpatient Hospital Stay: Payer: Medicare Other | Attending: Internal Medicine

## 2023-08-19 VITALS — BP 110/70 | HR 78 | Temp 98.1°F | Resp 17 | Ht 69.0 in | Wt 205.0 lb

## 2023-08-19 DIAGNOSIS — C3432 Malignant neoplasm of lower lobe, left bronchus or lung: Secondary | ICD-10-CM | POA: Diagnosis present

## 2023-08-19 DIAGNOSIS — C3411 Malignant neoplasm of upper lobe, right bronchus or lung: Secondary | ICD-10-CM

## 2023-08-19 DIAGNOSIS — F17210 Nicotine dependence, cigarettes, uncomplicated: Secondary | ICD-10-CM | POA: Diagnosis not present

## 2023-08-19 DIAGNOSIS — Z510 Encounter for antineoplastic radiation therapy: Secondary | ICD-10-CM | POA: Insufficient documentation

## 2023-08-19 DIAGNOSIS — Z87891 Personal history of nicotine dependence: Secondary | ICD-10-CM | POA: Insufficient documentation

## 2023-08-19 DIAGNOSIS — C3490 Malignant neoplasm of unspecified part of unspecified bronchus or lung: Secondary | ICD-10-CM

## 2023-08-19 LAB — CBC WITH DIFFERENTIAL (CANCER CENTER ONLY)
Abs Immature Granulocytes: 0.03 10*3/uL (ref 0.00–0.07)
Basophils Absolute: 0.1 10*3/uL (ref 0.0–0.1)
Basophils Relative: 1 %
Eosinophils Absolute: 0.3 10*3/uL (ref 0.0–0.5)
Eosinophils Relative: 3 %
HCT: 43.9 % (ref 39.0–52.0)
Hemoglobin: 14.5 g/dL (ref 13.0–17.0)
Immature Granulocytes: 0 %
Lymphocytes Relative: 21 %
Lymphs Abs: 1.9 10*3/uL (ref 0.7–4.0)
MCH: 29.5 pg (ref 26.0–34.0)
MCHC: 33 g/dL (ref 30.0–36.0)
MCV: 89.2 fL (ref 80.0–100.0)
Monocytes Absolute: 0.9 10*3/uL (ref 0.1–1.0)
Monocytes Relative: 9 %
Neutro Abs: 5.9 10*3/uL (ref 1.7–7.7)
Neutrophils Relative %: 66 %
Platelet Count: 287 10*3/uL (ref 150–400)
RBC: 4.92 MIL/uL (ref 4.22–5.81)
RDW: 15.1 % (ref 11.5–15.5)
WBC Count: 9.1 10*3/uL (ref 4.0–10.5)
nRBC: 0 % (ref 0.0–0.2)

## 2023-08-19 LAB — CMP (CANCER CENTER ONLY)
ALT: 7 U/L (ref 0–44)
AST: 13 U/L — ABNORMAL LOW (ref 15–41)
Albumin: 3.4 g/dL — ABNORMAL LOW (ref 3.5–5.0)
Alkaline Phosphatase: 79 U/L (ref 38–126)
Anion gap: 6 (ref 5–15)
BUN: 11 mg/dL (ref 8–23)
CO2: 26 mmol/L (ref 22–32)
Calcium: 8.3 mg/dL — ABNORMAL LOW (ref 8.9–10.3)
Chloride: 104 mmol/L (ref 98–111)
Creatinine: 0.93 mg/dL (ref 0.61–1.24)
GFR, Estimated: >60 mL/min (ref >60)
Glucose, Bld: 121 mg/dL — ABNORMAL HIGH (ref 70–99)
Potassium: 3.9 mmol/L (ref 3.5–5.1)
Sodium: 136 mmol/L (ref 135–145)
Total Bilirubin: 0.5 mg/dL (ref 0.0–1.2)
Total Protein: 6.7 g/dL (ref 6.5–8.1)

## 2023-08-19 NOTE — Progress Notes (Signed)
 [facility] [phone]   Fax:([phone]) [phone]  CONSULT NOTE  REFERRING PHYSICIAN: Dr. Levy Pupa  REASON FOR CONSULTATION:  83 years old white male recently diagnosed with lung cancer  HPI Grant Flores is a 83 y.o. male came to the clinic today for initial evaluation of recently diagnosed lung cancer accompanied by his wife.Discussed the use of AI scribe software for clinical note transcription with the patient, who gave verbal consent to proceed.  History of Present Illness   Grant Flores is an 83 year old male with lung cancer who presents for oncology consultation. He is accompanied by his wife, Grant Flores. He was referred by his heart doctor for evaluation of lung cancer.  He has a history of lung cancer, initially discovered after his heart doctor recommended a check-up due to concerns. He underwent annual CT scans for lung cancer screening since June 2019, which were discontinued after he turned 80. A recent CT scan on June 05, 2023, revealed a new solid speculated nodule in the left lower lobe and atypical findings in the right upper lobe.  APET scan on 07/17/2023 showed 2.6 cm markedly hypermetabolic lesion in the superior segment of the left lower lobe consistent with primary lung neoplasm. There was also 17 mm sub solid nodular opacity in the right upper lobe is mildly hypermetabolic and is likely a synchronous adenocarcinoma. No findings for metastatic disease involving the neck, chest, abdomen/pelvis or bony structures. It also showed 16 mm hypermetabolic lesion in the antral region of the stomach concerning for a small gastric carcinoma. Recommend endoscopic evaluation.  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 An MRI of the brain was conducted recently, but the report is pending.  He has a history of leg swelling and edema, which prompted a visit to the heart doctor. No recent symptoms such as fever, weight loss,  or difficulty breathing. He denies any significant pain or discomfort currently.  He has a significant smoking history, having smoked for 56 years, starting at age 52, and reportedly quit recently. He denies alcohol use and drug abuse. Socially, he worked in a [employer] and has been married for 57 years with one son.         Past Medical History:  Diagnosis Date   Asthma    CAD (coronary artery disease) 03/18/2007   s/p inferolateral STEMI.... PCI with BMS to LCX. (cath with EF 50%, mod inf HK, RCA 40%, small oD3 70%, OM1 40%, CFX occluded - tx with PCI);  echo 10/08: EF 45%, mild RVE;  Myoview 2009. Inferior infarct. no ischemia, EF 45%   COPD (chronic obstructive pulmonary disease) (HCC)    with ongoing tobacco   High cholesterol    Hypertension    Myocardial infarction (HCC) 06/17/2006   Obesity    Shortness of breath    with exertion     Past Surgical History:  Procedure Laterality Date   ANTERIOR CERVICAL DECOMP/DISCECTOMY FUSION  before 2008   APPENDECTOMY  age 22   ARTHRODESIS     C4-C6   Bare Metal Stent     BRONCHIAL BIOPSY  07/28/2023   Procedure: BRONCHIAL BIOPSIES;  Surgeon: Leslye Peer, MD;  Location: [facility] ENDOSCOPY;  Service: Pulmonary;;   BRONCHIAL BRUSHINGS  07/28/2023   Procedure: BRONCHIAL BRUSHINGS;  Surgeon: Leslye Peer, MD;  Location: [facility] ENDOSCOPY;  Service: Pulmonary;;   BRONCHIAL NEEDLE ASPIRATION BIOPSY  07/28/2023   Procedure: BRONCHIAL NEEDLE ASPIRATION BIOPSIES;  Surgeon: Leslye Peer, MD;  Location: [facility] ENDOSCOPY;  Service: Pulmonary;;   BRONCHIAL WASHINGS  07/28/2023   Procedure: BRONCHIAL WASHINGS;  Surgeon: Leslye Peer, MD;  Location: [facility] ENDOSCOPY;  Service: Pulmonary;;   CARDIAC CATHETERIZATION  06/17/2006   cardiologist: Dr. Sharlet Salina    CARDIOVASCULAR STRESS TEST  04/03/2011   CARPAL TUNNEL RELEASE  unknown   Bilateral    FIDUCIAL MARKER PLACEMENT  07/28/2023   Procedure: FIDUCIAL MARKER PLACEMENT;  Surgeon: Leslye Peer,  MD;  Location: [facility] ENDOSCOPY;  Service: Pulmonary;;   TOTAL KNEE ARTHROPLASTY  05/24/2011   Procedure: TOTAL KNEE ARTHROPLASTY;  Surgeon: Thera Flake., MD;  Location: [facility] OR;  Service: Orthopedics;  Laterality: Right;  RIGHT ARTHROSCOPY TOTAL KNEE    Family History  Problem Relation Age of Onset   Heart attack Father 57   Coronary artery disease Neg Hx     Social History Social History   Tobacco Use   Smoking status: Every Day    Current packs/day: 1.00    Types: Cigarettes   Smokeless tobacco: Never   Tobacco comments:    Patient stated that he has gone down to 1 pack a day  Substance Use Topics   Alcohol use: No   Drug use: No    No Known Allergies  Current Outpatient Medications  Medication Sig Dispense Refill   apixaban (ELIQUIS) 5 MG TABS tablet Take 1 tablet (5 mg total) by mouth 2 (two) times daily. Okay to restart this medication on 07/29/2023     atorvastatin (LIPITOR) 80 MG tablet TAKE 1 TABLET BY MOUTH ONCE  DAILY 100 tablet 2   azithromycin (ZITHROMAX Z-PAK) 250 MG tablet Take 2 tablets ( 500 mg total) Day 1 Take 1 tablet ( 250 mg)  Day 2 through 5 6 each 0   carvedilol (COREG) 6.25 MG tablet TAKE 1 TABLET BY MOUTH TWICE  DAILY WITH MEALS 200 tablet 2   losartan (COZAAR) 25 MG tablet Take 1 tablet (25 mg total) by mouth daily. 90 tablet 3   potassium chloride (KLOR-CON) 10 MEQ tablet Take 2 tablets (20 mEq total) by mouth 2 (two) times daily. 120 tablet 6   SPIRIVA RESPIMAT 2.5 MCG/ACT AERS Inhale into the lungs as needed.     torsemide (DEMADEX) 20 MG tablet Take 2 tablets (40 mg total) by mouth 2 (two) times daily. 120 tablet 6   No current facility-administered medications for this visit.    Review of Systems  Constitutional: positive for fatigue Eyes: negative Ears, nose, mouth, throat, and face: negative Respiratory: positive for cough and dyspnea on exertion Cardiovascular: negative Gastrointestinal:  negative Genitourinary:negative Integument/breast: negative Hematologic/lymphatic: negative Musculoskeletal:negative Neurological: negative Behavioral/Psych: negative Endocrine: negative Allergic/Immunologic: negative  Physical Exam  XBJ:YNWGN, healthy, no distress, well nourished, and well developed SKIN: skin color, texture, turgor are normal, no rashes or significant lesions HEAD: Normocephalic, No masses, lesions, tenderness or abnormalities EYES: normal, PERRLA, Conjunctiva are pink and non-injected EARS: External ears normal, Canals clear OROPHARYNX:no exudate, no erythema, and lips, buccal mucosa, and tongue normal  NECK: supple, no adenopathy, no JVD LYMPH:  no palpable lymphadenopathy, no hepatosplenomegaly LUNGS: clear to auscultation , and palpation HEART: regular rate & rhythm, no murmurs, and no gallops ABDOMEN:abdomen soft, non-tender, normal bowel sounds, and no masses or organomegaly BACK: Back symmetric, no curvature., No CVA tenderness EXTREMITIES:no joint deformities, effusion, or inflammation, no edema  NEURO: alert & oriented x 3 with fluent speech, no focal motor/sensory deficits  PERFORMANCE STATUS:  ECOG 1  LABORATORY DATA: Lab Results  Component Value Date   WBC 11.7 (H) 07/28/2023   HGB 14.9 07/28/2023   HCT 46.4 07/28/2023   MCV 92.2 07/28/2023   PLT 263 07/28/2023      Chemistry      Component Value Date/Time   NA 137 07/28/2023 0646   NA 141 06/12/2023 1359   K 3.6 07/28/2023 0646   CL 103 07/28/2023 0646   CO2 24 07/28/2023 0646   BUN 5 (L) 07/28/2023 0646   BUN 9 06/12/2023 1359   CREATININE 0.96 07/28/2023 0646      Component Value Date/Time   CALCIUM 8.5 (L) 07/28/2023 0646   ALKPHOS 99 05/30/2023 1010   AST 17 05/30/2023 1010   ALT 13 05/30/2023 1010   BILITOT 0.9 05/30/2023 1010       RADIOGRAPHIC STUDIES: DG Chest Port 1 View Result Date: 07/28/2023 CLINICAL DATA:  Status post bronchoscopy. EXAM: PORTABLE CHEST 1 VIEW  COMPARISON:  X-ray 03/16/2014 FINDINGS: Enlarged cardiopericardial silhouette. Prominence of central vasculature. No pneumothorax, effusion. Bronchial wall thickening. New marker at the right upper lung with some vague nodularity. Please correlate for history of lung lesions best seen on prior cross-sectional imaging including PET-CT scan of 07/17/2023. IMPRESSION: New right upper lobe lung marker. No pneumothorax or effusion post bronchoscopy. Enlarged heart with increasing vascular congestion and interstitial changes. Recommend follow-up Electronically Signed   By: Karen Kays M.D.   On: 07/28/2023 13:17   DG C-ARM BRONCHOSCOPY Result Date: 07/28/2023 C-ARM BRONCHOSCOPY: Fluoroscopy was utilized by the requesting physician.  No radiographic interpretation.   DG C-Arm 1-60 Min-No Report Result Date: 07/28/2023 Fluoroscopy was utilized by the requesting physician.  No radiographic interpretation.    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 PD-L1 expression was negative.  PLAN: I had a lengthy discussion with the patient and his wife today about his current disease stage, prognosis and treatment options. I personally independently reviewed the scan images as well as the pathology report.  The patient is not a good candidate for surgical resection because of his age and comorbidities as well as the bilateral disease.    Squamous Cell Carcinoma of the Lung Diagnosed with primary squamous cell carcinoma in the left lower lobe and right upper lobe, confirmed via bronchoscopy and biopsy. Due to age and bilateral lung involvement, surgical intervention is not feasible. Stereotactic body radiation therapy (SBRT) is planned  for both tumors, considered curative. Chemotherapy, targeted therapy, and immunotherapy are not necessary at this time. - Proceed with SBRT as planned by Dr. Kathrynn Running - Schedule follow-up in four months with a chest scan to assess treatment response  Hypermetabolic Lesion in the Stomach A hypermetabolic lesion identified in the stomach during a PET scan raises concern for potential malignancy. Further evaluation by a gastroenterologist is necessary. - Ensure referral to gastroenterology for evaluation of the stomach lesion - Follow up with the gastroenterologist to confirm appointment scheduling   The patient was advised to call immediately if he has any other concerning symptoms in the interval. The patient voices understanding of current disease status and treatment options and is in agreement with the current care plan.  All questions were answered. The patient knows to  call the clinic with any problems, questions or concerns. We can certainly see the patient much sooner if necessary.  Thank you so much for allowing me to participate in the care of Grant Flores. I will continue to follow up the patient with you and assist in his care.  The total time spent in the appointment was 60 minutes.  Disclaimer: This note was dictated with voice recognition software. Similar sounding words can inadvertently be transcribed and may not be corrected upon review.   Grant Flores August 19, 2023, 1:50 PM

## 2023-08-19 NOTE — Progress Notes (Signed)
 I met the pt today for the first time face to face at his consult with Dr Arbutus Ped. Pt was accompanied by his wife, Grant Flores. I explained my role as the pts nurse navigator. Pt is currently scheduled to receive radiation to both lung nodules and follow up with imaging and an appt with Dr Arbutus Ped in 4 mos. Pt provided with my card and an education booklet on squamous cell lung cancer. I escorted the pt to the scheduling section and a follow up appt was made for the pt on 7/8 with Dr Arbutus Ped. Pt had no questions at this time.

## 2023-08-19 NOTE — Progress Notes (Signed)
  Radiation Oncology         [phone] ________________________________  Name: Grant Flores MRN: [id number]  Date: 08/19/2023  DOB: 04-22-1941  STEREOTACTIC BODY RADIOTHERAPY SIMULATION AND TREATMENT PLANNING NOTE    ICD-10-CM   1. Malignant neoplasm of right upper lobe of lung (HCC)  C34.11     2. Malignant neoplasm of lower lobe bronchus, left (HCC)  C34.32       DIAGNOSIS:  83 yo man with bilateral synchronous Stage I non-small cell carcinoma  NARRATIVE:  The patient was brought to the CT Simulation planning suite.  Identity was confirmed.  All relevant records and images related to the planned course of therapy were reviewed.  The patient freely provided informed written consent to proceed with treatment after reviewing the details related to the planned course of therapy. The consent form was witnessed and verified by the simulation staff.  Then, the patient was set-up in a stable reproducible  supine position for radiation therapy.  A BodyFix immobilization pillow was fabricated for reproducible positioning.  Then I personally applied the abdominal compression paddle to limit respiratory excursion.  4D respiratoy motion management CT images were obtained.  Surface markings were placed.  The CT images were loaded into the planning software.  Then, using Cine, MIP, and standard views, the internal target volume (ITV) and planning target volumes (PTV) were delinieated, and avoidance structures were contoured.  Treatment planning then occurred.  The radiation prescription was entered and confirmed.  A total of two complex treatment devices were fabricated in the form of the BodyFix immobilization pillow and a neck accuform cushion.  I have requested : 3D Simulation  I have requested a DVH of the following structures: Heart, Lungs, Esophagus, Chest Wall, Brachial Plexus, Major Blood Vessels, and targets.  SPECIAL TREATMENT PROCEDURE:  The planned course of therapy using radiation constitutes a  special treatment procedure. Special care is required in the management of this patient for the following reasons. This treatment constitutes a Special Treatment Procedure for the following reason: [ High dose per fraction requiring special monitoring for increased toxicities of treatment including daily imaging..  The special nature of the planned course of radiotherapy will require increased physician supervision and oversight to ensure patient's safety with optimal treatment outcomes.  This requires extended time and effort.    RESPIRATORY MOTION MANAGEMENT SIMULATION:  In order to account for effect of respiratory motion on target structures and other organs in the planning and delivery of radiotherapy, this patient underwent respiratory motion management simulation.  To accomplish this, when the patient was brought to the CT simulation planning suite, 4D respiratoy motion management CT images were obtained.  The CT images were loaded into the planning software.  Then, using a variety of tools including Cine, MIP, and standard views, the target volume and planning target volumes (PTV) were delineated.  Avoidance structures were contoured.  Treatment planning then occurred.  Dose volume histograms were generated and reviewed for each of the requested structure.  The resulting plan was carefully reviewed and approved today.  PLAN:  The patient will receive 60 Gy in 5 fraction to 2.6 cm lesion in superior LLL; 54 Gy in 3 fraction. to 17 mm sub-solid nodular opacity in RUL   ________________________________  Artist Pais. Kathrynn Running, M.D.

## 2023-08-20 DIAGNOSIS — Z510 Encounter for antineoplastic radiation therapy: Secondary | ICD-10-CM | POA: Diagnosis not present

## 2023-08-21 ENCOUNTER — Encounter (HOSPITAL_BASED_OUTPATIENT_CLINIC_OR_DEPARTMENT_OTHER): Payer: Medicare Other | Attending: Internal Medicine | Admitting: Internal Medicine

## 2023-08-21 DIAGNOSIS — I87312 Chronic venous hypertension (idiopathic) with ulcer of left lower extremity: Secondary | ICD-10-CM | POA: Insufficient documentation

## 2023-08-21 DIAGNOSIS — C3432 Malignant neoplasm of lower lobe, left bronchus or lung: Secondary | ICD-10-CM | POA: Insufficient documentation

## 2023-08-21 DIAGNOSIS — F17200 Nicotine dependence, unspecified, uncomplicated: Secondary | ICD-10-CM | POA: Insufficient documentation

## 2023-08-21 DIAGNOSIS — I5032 Chronic diastolic (congestive) heart failure: Secondary | ICD-10-CM | POA: Diagnosis not present

## 2023-08-21 DIAGNOSIS — L97822 Non-pressure chronic ulcer of other part of left lower leg with fat layer exposed: Secondary | ICD-10-CM | POA: Insufficient documentation

## 2023-08-21 DIAGNOSIS — C3411 Malignant neoplasm of upper lobe, right bronchus or lung: Secondary | ICD-10-CM | POA: Insufficient documentation

## 2023-08-25 ENCOUNTER — Ambulatory Visit: Payer: Medicare Other

## 2023-08-26 ENCOUNTER — Ambulatory Visit: Payer: Medicare Other

## 2023-08-27 ENCOUNTER — Ambulatory Visit: Payer: Medicare Other

## 2023-08-27 LAB — FUNGUS CULTURE WITH STAIN

## 2023-08-27 LAB — FUNGAL ORGANISM REFLEX

## 2023-08-27 LAB — FUNGUS CULTURE RESULT

## 2023-08-28 ENCOUNTER — Encounter (HOSPITAL_BASED_OUTPATIENT_CLINIC_OR_DEPARTMENT_OTHER): Payer: Medicare Other | Admitting: Internal Medicine

## 2023-08-28 ENCOUNTER — Ambulatory Visit: Payer: Medicare Other

## 2023-08-28 DIAGNOSIS — F17200 Nicotine dependence, unspecified, uncomplicated: Secondary | ICD-10-CM

## 2023-08-28 DIAGNOSIS — L97822 Non-pressure chronic ulcer of other part of left lower leg with fat layer exposed: Secondary | ICD-10-CM | POA: Diagnosis not present

## 2023-08-28 DIAGNOSIS — J449 Chronic obstructive pulmonary disease, unspecified: Secondary | ICD-10-CM | POA: Diagnosis not present

## 2023-08-28 DIAGNOSIS — I87312 Chronic venous hypertension (idiopathic) with ulcer of left lower extremity: Secondary | ICD-10-CM

## 2023-08-29 ENCOUNTER — Ambulatory Visit (HOSPITAL_BASED_OUTPATIENT_CLINIC_OR_DEPARTMENT_OTHER): Payer: Medicare Other | Admitting: Internal Medicine

## 2023-08-29 ENCOUNTER — Ambulatory Visit: Payer: Medicare Other

## 2023-09-01 ENCOUNTER — Ambulatory Visit
Admission: RE | Admit: 2023-09-01 | Discharge: 2023-09-01 | Disposition: A | Discharge status: Home / Self Care | Payer: Medicare Other | Source: Ambulatory Visit | Attending: Radiation Oncology

## 2023-09-01 ENCOUNTER — Other Ambulatory Visit: Payer: Self-pay

## 2023-09-01 DIAGNOSIS — Z510 Encounter for antineoplastic radiation therapy: Secondary | ICD-10-CM | POA: Diagnosis not present

## 2023-09-01 LAB — RAD ONC ARIA SESSION SUMMARY
Course Elapsed Days: 0
Plan Fractions Treated to Date: 1
Plan Fractions Treated to Date: 1
Plan Prescribed Dose Per Fraction: 12 Gy
Plan Prescribed Dose Per Fraction: 18 Gy
Plan Total Fractions Prescribed: 3
Plan Total Fractions Prescribed: 5
Plan Total Prescribed Dose: 54 Gy
Plan Total Prescribed Dose: 60 Gy
Reference Point Dosage Given to Date: 12 Gy
Reference Point Dosage Given to Date: 18 Gy
Reference Point Session Dosage Given: 12 Gy
Reference Point Session Dosage Given: 18 Gy
Session Number: 1

## 2023-09-02 ENCOUNTER — Encounter (HOSPITAL_BASED_OUTPATIENT_CLINIC_OR_DEPARTMENT_OTHER): Admitting: Internal Medicine

## 2023-09-02 DIAGNOSIS — I87312 Chronic venous hypertension (idiopathic) with ulcer of left lower extremity: Secondary | ICD-10-CM

## 2023-09-02 DIAGNOSIS — I5032 Chronic diastolic (congestive) heart failure: Secondary | ICD-10-CM

## 2023-09-02 DIAGNOSIS — J449 Chronic obstructive pulmonary disease, unspecified: Secondary | ICD-10-CM

## 2023-09-02 DIAGNOSIS — L97822 Non-pressure chronic ulcer of other part of left lower leg with fat layer exposed: Secondary | ICD-10-CM

## 2023-09-03 ENCOUNTER — Ambulatory Visit
Admission: RE | Admit: 2023-09-03 | Discharge: 2023-09-03 | Disposition: A | Discharge status: Home / Self Care | Payer: Medicare Other | Source: Ambulatory Visit | Attending: Radiation Oncology | Admitting: Radiation Oncology

## 2023-09-03 ENCOUNTER — Other Ambulatory Visit: Payer: Self-pay

## 2023-09-03 ENCOUNTER — Encounter (HOSPITAL_BASED_OUTPATIENT_CLINIC_OR_DEPARTMENT_OTHER): Admitting: Internal Medicine

## 2023-09-03 DIAGNOSIS — Z510 Encounter for antineoplastic radiation therapy: Secondary | ICD-10-CM | POA: Diagnosis not present

## 2023-09-03 LAB — RAD ONC ARIA SESSION SUMMARY
Course Elapsed Days: 2
Plan Fractions Treated to Date: 2
Plan Fractions Treated to Date: 2
Plan Prescribed Dose Per Fraction: 12 Gy
Plan Prescribed Dose Per Fraction: 18 Gy
Plan Total Fractions Prescribed: 3
Plan Total Fractions Prescribed: 5
Plan Total Prescribed Dose: 54 Gy
Plan Total Prescribed Dose: 60 Gy
Reference Point Dosage Given to Date: 24 Gy
Reference Point Dosage Given to Date: 36 Gy
Reference Point Session Dosage Given: 12 Gy
Reference Point Session Dosage Given: 18 Gy
Session Number: 2

## 2023-09-04 ENCOUNTER — Encounter (HOSPITAL_BASED_OUTPATIENT_CLINIC_OR_DEPARTMENT_OTHER): Admitting: Internal Medicine

## 2023-09-05 ENCOUNTER — Ambulatory Visit
Admission: RE | Admit: 2023-09-05 | Discharge: 2023-09-05 | Disposition: A | Discharge status: Home / Self Care | Payer: Medicare Other | Source: Ambulatory Visit | Attending: Radiation Oncology | Admitting: Radiation Oncology

## 2023-09-05 ENCOUNTER — Other Ambulatory Visit: Payer: Self-pay

## 2023-09-05 DIAGNOSIS — Z510 Encounter for antineoplastic radiation therapy: Secondary | ICD-10-CM | POA: Diagnosis not present

## 2023-09-05 LAB — RAD ONC ARIA SESSION SUMMARY
Course Elapsed Days: 4
Plan Fractions Treated to Date: 3
Plan Prescribed Dose Per Fraction: 12 Gy
Plan Total Fractions Prescribed: 5
Plan Total Prescribed Dose: 60 Gy
Reference Point Dosage Given to Date: 36 Gy
Reference Point Session Dosage Given: 12 Gy
Session Number: 3

## 2023-09-08 ENCOUNTER — Ambulatory Visit
Admission: RE | Admit: 2023-09-08 | Discharge: 2023-09-08 | Disposition: A | Discharge status: Home / Self Care | Payer: Medicare Other | Source: Ambulatory Visit | Attending: Radiation Oncology | Admitting: Radiation Oncology

## 2023-09-08 ENCOUNTER — Other Ambulatory Visit: Payer: Self-pay

## 2023-09-08 DIAGNOSIS — Z510 Encounter for antineoplastic radiation therapy: Secondary | ICD-10-CM | POA: Diagnosis not present

## 2023-09-08 LAB — RAD ONC ARIA SESSION SUMMARY
Course Elapsed Days: 7
Plan Fractions Treated to Date: 3
Plan Fractions Treated to Date: 4
Plan Prescribed Dose Per Fraction: 12 Gy
Plan Prescribed Dose Per Fraction: 18 Gy
Plan Total Fractions Prescribed: 3
Plan Total Fractions Prescribed: 5
Plan Total Prescribed Dose: 54 Gy
Plan Total Prescribed Dose: 60 Gy
Reference Point Dosage Given to Date: 48 Gy
Reference Point Dosage Given to Date: 54 Gy
Reference Point Session Dosage Given: 12 Gy
Reference Point Session Dosage Given: 18 Gy
Session Number: 4

## 2023-09-09 ENCOUNTER — Encounter: Payer: Self-pay | Admitting: Physician Assistant

## 2023-09-09 ENCOUNTER — Ambulatory Visit: Payer: Medicare Other | Admitting: Physician Assistant

## 2023-09-09 ENCOUNTER — Encounter: Payer: Self-pay | Admitting: Internal Medicine

## 2023-09-09 ENCOUNTER — Telehealth: Payer: Self-pay

## 2023-09-09 VITALS — BP 140/76 | HR 82 | Ht 69.0 in | Wt 201.5 lb

## 2023-09-09 DIAGNOSIS — F17210 Nicotine dependence, cigarettes, uncomplicated: Secondary | ICD-10-CM

## 2023-09-09 DIAGNOSIS — R948 Abnormal results of function studies of other organs and systems: Secondary | ICD-10-CM | POA: Diagnosis not present

## 2023-09-09 DIAGNOSIS — I483 Typical atrial flutter: Secondary | ICD-10-CM

## 2023-09-09 DIAGNOSIS — I2510 Atherosclerotic heart disease of native coronary artery without angina pectoris: Secondary | ICD-10-CM

## 2023-09-09 DIAGNOSIS — F17200 Nicotine dependence, unspecified, uncomplicated: Secondary | ICD-10-CM

## 2023-09-09 DIAGNOSIS — I5032 Chronic diastolic (congestive) heart failure: Secondary | ICD-10-CM | POA: Diagnosis not present

## 2023-09-09 DIAGNOSIS — C3432 Malignant neoplasm of lower lobe, left bronchus or lung: Secondary | ICD-10-CM

## 2023-09-09 NOTE — Telephone Encounter (Signed)
 [facility] Pre-operative Risk Assessment     Request for surgical clearance:     Endoscopy Procedure  What type of surgery is being performed?     Endoscopy  When is this surgery scheduled?     09/17/23  What type of clearance is required ?   Medical/ Pharmacy  Are there any medications that need to be held prior to surgery and how long? Eliquis x 2 days  Practice name and name of physician performing surgery?      [facility]  What is your office phone and fax number?      Phone- [phone]  Fax- [phone]  Anesthesia type (None, local, MAC, general) ?       MAC   Please route your response to [employer]

## 2023-09-09 NOTE — Telephone Encounter (Signed)
 Please advise holding Eliquis prior to Endoscopy on 4/2.   Thank you!  DW

## 2023-09-09 NOTE — Progress Notes (Signed)
 Noted.

## 2023-09-09 NOTE — Progress Notes (Unsigned)
 09/09/2023 Grant Flores [id number] [birth year]  Referring provider: Little Ishikawa* Primary GI doctor: Dr. Marina Goodell  ASSESSMENT AND PLAN:     Recent abnormal PET scan July 07 2023 for lung cancer found 16 mm hypermetabolic lesion antral region stomach 08/19/2023 Hgb normal at 14.5 Denies GERD, AB pain, no dysphagia, no nausea vomiting, no weight loss, no family history States he has history of hiatal hernia No history of melena, no NSAIDS, no ETOH history Continues to smoke, has 60 pack year smoking history Never had EGD Will set up for EGD to evaluate, will need to hold eliquis for 2 days, will get permission. I think patient should be able to be done at [facility], he is able to get on the table, no CP, no SOB, recent echo.  Will send a message to [name] for chart check, will get cardiac clearance as well as medical clearance.  I discussed risks of EGD with patient today, including risk of sedation, bleeding or perforation.  Patient provides understanding and gave verbal consent to proceed.  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 Kathrynn Running, last treatment 3/24, last dose tomorrow No chest pain, no SOB, no oxygen  Heart failure 05/2023 EF 40-45 %, no aortic stenosis, had aortic root dilation  Atrial flutter On eliquis 5 mg twice a daily Hold Eliquis for 2 days before procedure will instruct when and how to resume after procedure.  Patient understands that there is a low but real risk of cardiovascular event such as heart attack, stroke, or embolism /  thrombosis, or ischemia while off Eliquis  The patient consents to proceed.  Will  communicate by phone or EMR with patient's prescribing provider to confirm that holding Eliquis is reasonable in this case.   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 male with a past medical history listed below presents for evaluation of abnormal PET scan.   Discussed the use of AI scribe software for clinical note transcription with the patient, who gave verbal consent to proceed.  History of Present Illness   Grant Flores is an 83 year old male with lung cancer who presents for evaluation of a gastric abnormality found on PET scan. He was referred by Dr. Jerolyn Center for evaluation of a gastric abnormality found on PET scan during lung cancer workup.  An area of abnormal uptake was noted in the stomach during a PET scan performed in January 2025 to assess lung masses. He has no symptoms such as abdominal pain, trouble swallowing, nausea, vomiting, decreased appetite, or weight loss. No family history of gastric or stomach cancers.  He has lung cancer diagnosed in January 2025 and is currently undergoing radiation therapy, with the last dose scheduled  for tomorrow. No symptoms such as shortness of breath or chest pain. He is not on oxygen therapy.  He has a significant smoking history, having started at age 44, initially smoking a pack a week, and continues to smoke despite undergoing radiation therapy. No shortness of breath or chest pain.  His past medical history includes atrial flutter, managed with Eliquis 5 mg twice daily. No recent episodes reported.  He  experiences swelling in his legs, managed with Una wraps. The fluid has decreased but not resolved completely. He is not on antibiotics for this condition.  His family history includes a sister who had lung cancer at age 48, attributed to smoking. His mother lived to 54, his sister to 43, and his father to 80.      He  reports that he has been smoking cigarettes. He has never used smokeless tobacco. He reports that he does not drink alcohol and does not use drugs.  RELEVANT GI HISTORY, IMAGING AND LABS: Results   RADIOLOGY PET scan: Abnormal uptake in lung masses and stomach (06/2023)  DIAGNOSTIC Bronchoscopy: Performed in hospital      CBC    Component Value Date/Time   WBC 9.1 08/19/2023 1337   WBC 11.7 (H) 07/28/2023 0646   RBC 4.92 08/19/2023 1337   HGB 14.5 08/19/2023 1337   HGB 14.8 05/30/2023 1010   HCT 43.9 08/19/2023 1337   HCT 45.8 05/30/2023 1010   PLT 287 08/19/2023 1337   PLT 229 05/30/2023 1010   MCV 89.2 08/19/2023 1337   MCV 93 05/30/2023 1010   MCH 29.5 08/19/2023 1337   MCHC 33.0 08/19/2023 1337   RDW 15.1 08/19/2023 1337   RDW 13.5 05/30/2023 1010   LYMPHSABS 1.9 08/19/2023 1337   MONOABS 0.9 08/19/2023 1337   EOSABS 0.3 08/19/2023 1337   BASOSABS 0.1 08/19/2023 1337   Recent Labs    05/30/23 1010 07/28/23 0646 08/19/23 1337  HGB 14.8 14.9 14.5    CMP     Component Value Date/Time   NA 136 08/19/2023 1337   NA 141 06/12/2023 1359   K 3.9 08/19/2023 1337   CL 104 08/19/2023 1337   CO2 26 08/19/2023 1337   GLUCOSE 121 (H) 08/19/2023 1337   BUN 11 08/19/2023 1337   BUN 9 06/12/2023 1359   CREATININE 0.93 08/19/2023 1337   CALCIUM 8.3 (L) 08/19/2023 1337   PROT 6.7 08/19/2023 1337   PROT 6.5 05/30/2023 1010   ALBUMIN 3.4 (L) 08/19/2023 1337   ALBUMIN 3.6 (L) 05/30/2023 1010   AST 13 (L) 08/19/2023 1337   ALT 7 08/19/2023 1337   ALKPHOS 79 08/19/2023 1337   BILITOT 0.5 08/19/2023 1337   GFRNONAA >60 08/19/2023 1337   GFRAA >90  05/25/2011 0500      Latest Ref Rng & Units 08/19/2023    1:37 PM 05/30/2023   10:10 AM 05/24/2011    9:27 PM  Hepatic Function  Total Protein 6.5 - 8.1 g/dL 6.7  6.5  6.0   Albumin 3.5 - 5.0 g/dL 3.4  3.6  3.4   AST 15 - 41 U/L 13  17  17    ALT 0 - 44 U/L 7  13  12    Alk Phosphatase 38 - 126 U/L 79  99  49   Total Bilirubin 0.0 - 1.2 mg/dL 0.5  0.9  0.7       Current Medications:    Current Outpatient Medications (Cardiovascular):    atorvastatin (LIPITOR) 80 MG tablet, TAKE 1  TABLET BY MOUTH ONCE  DAILY   carvedilol (COREG) 6.25 MG tablet, TAKE 1 TABLET BY MOUTH TWICE  DAILY WITH MEALS   losartan (COZAAR) 25 MG tablet, Take 1 tablet (25 mg total) by mouth daily.   torsemide (DEMADEX) 20 MG tablet, Take 2 tablets (40 mg total) by mouth 2 (two) times daily.  Current Outpatient Medications (Respiratory):    SPIRIVA RESPIMAT 2.5 MCG/ACT AERS, Inhale into the lungs as needed.   Current Outpatient Medications (Hematological):    apixaban (ELIQUIS) 5 MG TABS tablet, Take 1 tablet (5 mg total) by mouth 2 (two) times daily. Okay to restart this medication on 07/29/2023  Current Outpatient Medications (Other):    azithromycin (ZITHROMAX Z-PAK) 250 MG tablet, Take 2 tablets ( 500 mg total) Day 1 Take 1 tablet ( 250 mg)  Day 2 through 5   potassium chloride (KLOR-CON) 10 MEQ tablet, Take 2 tablets (20 mEq total) by mouth 2 (two) times daily.  Medical History:  Past Medical History:  Diagnosis Date   Asthma    CAD (coronary artery disease) 03/18/2007   s/p inferolateral STEMI.... PCI with BMS to LCX. (cath with EF 50%, mod inf HK, RCA 40%, small oD3 70%, OM1 40%, CFX occluded - tx with PCI);  echo 10/08: EF 45%, mild RVE;  Myoview 2009. Inferior infarct. no ischemia, EF 45%   COPD (chronic obstructive pulmonary disease) (HCC)    with ongoing tobacco   High cholesterol    Hypertension    Lung cancer (HCC)    Myocardial infarction (HCC) 06/17/2006   Obesity    Shortness of breath     with exertion    Allergies: No Known Allergies   Surgical History:  He  has a past surgical history that includes Bare Metal Stent; Arthrodesis; Carpal tunnel release (unknown); Cardiovascular stress test (04/03/2011); Cardiac catheterization (06/17/2006); Anterior cervical decomp/discectomy fusion (before 2008); Appendectomy (age 37); Total knee arthroplasty (05/24/2011); Bronchial biopsy (07/28/2023); Bronchial brushings (07/28/2023); Bronchial needle aspiration biopsy (07/28/2023); Bronchial washings (07/28/2023); and Fiducial marker placement (07/28/2023). Family History:  His family history includes Heart attack (age of onset: 25) in his father; Throat cancer in his sister.  REVIEW OF SYSTEMS  : All other systems reviewed and negative except where noted in the History of Present Illness.  PHYSICAL EXAM: BP (!) 140/76   Pulse 82   Ht 5\' 9"  (1.753 m)   Wt 201 lb 8 oz (91.4 kg)   BMI 29.76 kg/m  Physical Exam   GENERAL APPEARANCE: obese, chronically ill appearing, in no apparent distress. HEENT: No cervical lymphadenopathy, unremarkable thyroid, sclerae anicteric, conjunctiva pink. RESPIRATORY: Respiratory effort normal, breath sounds equal bilaterally with wheezing/rhonchi. without rales, CARDIO: Regular rate and rhythm with no murmurs, rubs, or gallops, peripheral pulses intact. ABDOMEN: Soft, obese, active bowel sounds in all four quadrants, non-tender, no rebound, no mass appreciated. RECTAL: Declines. MUSCULOSKELETAL: Full range of motion, walks with cane, able to get on table SKIN: Dry, intact without rashes or lesions. No jaundice. NEURO: Alert, oriented, no focal deficits. PSYCH: Cooperative, normal mood and affect. EXTREMITIES: Edema present in legs with unna boots  Doree Albee, PA-C 11:27 AM

## 2023-09-09 NOTE — Patient Instructions (Signed)
 You have been scheduled for an endoscopy. Please follow written instructions given to you at your visit today.  If you use inhalers (even only as needed), please bring them with you on the day of your procedure.  If you take any of the following medications, they will need to be adjusted prior to your procedure:   DO NOT TAKE 7 DAYS PRIOR TO TEST- Trulicity (dulaglutide) Ozempic, Wegovy (semaglutide) Mounjaro (tirzepatide) Bydureon Bcise (exanatide extended release)  DO NOT TAKE 1 DAY PRIOR TO YOUR TEST Rybelsus (semaglutide) Adlyxin (lixisenatide) Victoza (liraglutide) Byetta (exanatide) ___________________________________________________________________________  Due to recent changes in healthcare laws, you may see the results of your imaging and laboratory studies on MyChart before your provider has had a chance to review them.  We understand that in some cases there may be results that are confusing or concerning to you. Not all laboratory results come back in the same time frame and the provider may be waiting for multiple results in order to interpret others.  Please give [place] 48 hours in order for your provider to thoroughly review all the results before contacting the office for clarification of your results.  _______________________________________________________  If your blood pressure at your visit was 140/90 or greater, please contact your primary care physician to follow up on this.  _______________________________________________________  If you are age 37 or older, your body mass index should be between 23-30. Your Body mass index is 29.76 kg/m. If this is out of the aforementioned range listed, please consider follow up with your Primary Care Provider.  If you are age 19 or younger, your body mass index should be between 19-25. Your Body mass index is 29.76 kg/m. If this is out of the aformentioned range listed, please consider follow up with your Primary Care Provider.    ________________________________________________________  The [facility] providers would like to encourage you to use [facility] to communicate with providers for non-urgent requests or questions.  Due to long hold times on the telephone, sending your provider a message by [facility] may be a faster and more efficient way to get a response.  Please allow 48 business hours for a response.  Please remember that this is for non-urgent requests.  _______________________________________________________

## 2023-09-10 ENCOUNTER — Ambulatory Visit
Admission: RE | Admit: 2023-09-10 | Discharge: 2023-09-10 | Disposition: A | Discharge status: Home / Self Care | Source: Ambulatory Visit | Attending: Radiation Oncology

## 2023-09-10 ENCOUNTER — Other Ambulatory Visit: Payer: Self-pay

## 2023-09-10 DIAGNOSIS — Z510 Encounter for antineoplastic radiation therapy: Secondary | ICD-10-CM | POA: Diagnosis not present

## 2023-09-10 LAB — RAD ONC ARIA SESSION SUMMARY
Course Elapsed Days: 9
Plan Fractions Treated to Date: 5
Plan Prescribed Dose Per Fraction: 12 Gy
Plan Total Fractions Prescribed: 5
Plan Total Prescribed Dose: 60 Gy
Reference Point Dosage Given to Date: 60 Gy
Reference Point Session Dosage Given: 12 Gy
Session Number: 5

## 2023-09-10 LAB — ACID FAST CULTURE WITH REFLEXED SENSITIVITIES (MYCOBACTERIA): Acid Fast Culture: NEGATIVE

## 2023-09-10 NOTE — Telephone Encounter (Signed)
     Primary Cardiologist: [name], MD  Clinical pharmacist have reviewed chart as part of pre-operative protocol coverage.  The following recommendations have been provided for, Grant Flores:  Patient with diagnosis of a flutter on Eliquis for anticoagulation.     Procedure: Endoscopy Date of procedure: 09/17/2023     CHA2DS2-VASc Score = 5   This indicates a 7.2% annual risk of stroke. The patient's score is based upon: CHF History: 1 HTN History: 1 Diabetes History: 0 Stroke History: 0 Vascular Disease History: 1 Age Score: 2 Gender Score: 0     CrCl 68 mL/min Platelet count 287 K     Per office protocol, patient can hold Eliquis for 2 days prior to procedure.  I will route this recommendation to the requesting party via Epic fax function and remove from pre-op pool.  Please call with questions.  Thomasene Ripple. Dallys Nowakowski NP-C     09/10/2023, 7:42 AM [facility] [address] Office [phone] Fax [phone]

## 2023-09-10 NOTE — Telephone Encounter (Signed)
 Patient with diagnosis of a flutter on Eliquis for anticoagulation.    Procedure: Endoscopy Date of procedure: 09/17/2023   CHA2DS2-VASc Score = 5   This indicates a 7.2% annual risk of stroke. The patient's score is based upon: CHF History: 1 HTN History: 1 Diabetes History: 0 Stroke History: 0 Vascular Disease History: 1 Age Score: 2 Gender Score: 0    CrCl 68 mL/min Platelet count 287 K   Per office protocol, patient can hold Eliquis for 2 days prior to procedure.     **This guidance is not considered finalized until pre-operative APP has relayed final recommendations.**

## 2023-09-11 ENCOUNTER — Encounter (HOSPITAL_BASED_OUTPATIENT_CLINIC_OR_DEPARTMENT_OTHER): Admitting: Internal Medicine

## 2023-09-11 DIAGNOSIS — L97822 Non-pressure chronic ulcer of other part of left lower leg with fat layer exposed: Secondary | ICD-10-CM | POA: Diagnosis not present

## 2023-09-11 DIAGNOSIS — I87312 Chronic venous hypertension (idiopathic) with ulcer of left lower extremity: Secondary | ICD-10-CM

## 2023-09-11 NOTE — Radiation Completion Notes (Addendum)
  Radiation Oncology         [phone] ________________________________  Name: NIXXON GILSTRAP MRN: [id number]  Date: 09/10/2023  DOB: 1940/10/17  Referring Physician: Racheal Buddle, M.D. Date of Service: 2023-09-11 Radiation Oncologist: Bartholome Ligas, M.D. 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   Intent: Curative     ==========DELIVERED PLANS==========  First Treatment Date: 2023-09-01 Last Treatment Date: 2023-09-10   Plan Name: Lung_L_SBRT Site: Lung, Left Technique: SBRT/SRT-IMRT Mode: Photon Dose Per Fraction: 12 Gy Prescribed Dose (Delivered / Prescribed): 60 Gy / 60 Gy Prescribed Fxs (Delivered / Prescribed): 5 / 5   Plan Name: Lung_R_SBRT Site: Lung, Right Technique: SBRT/SRT-IMRT Mode: Photon Dose Per Fraction: 18 Gy Prescribed Dose (Delivered / Prescribed): 54 Gy / 54 Gy Prescribed Fxs (Delivered / Prescribed): 3 / 3     ==========ON TREATMENT VISIT DATES========== 2023-09-01, 2023-09-01, 2023-09-03, 2023-09-03, 2023-09-05, 2023-09-08, 2023-09-08, 2023-09-10, 2023-09-10   See weekly On Treatment Notes in Epic for details in the Media tab (listed as Progress notes on the On Treatment Visit Dates listed above).  He tolerated the radiation treatment very well with only modest fatigue.  He denied any esophagitis or issues with swallowing.  The patient will receive a call in about one month from the radiation oncology department. He will continue follow up with his medical oncologist, Dr. Liam Redhead, as well.  ------------------------------------------------   Kenith Payer, MD [facility]  Radiation Oncology Direct Dial: [phone]  Fax: [phone] [email or web address]  Skype  LinkedIn

## 2023-09-17 ENCOUNTER — Ambulatory Visit (AMBULATORY_SURGERY_CENTER): Admitting: Internal Medicine

## 2023-09-17 ENCOUNTER — Encounter: Payer: Self-pay | Admitting: Internal Medicine

## 2023-09-17 VITALS — BP 108/72 | HR 85 | Temp 98.2°F | Resp 16 | Ht 69.0 in | Wt 201.0 lb

## 2023-09-17 DIAGNOSIS — K222 Esophageal obstruction: Secondary | ICD-10-CM

## 2023-09-17 DIAGNOSIS — K449 Diaphragmatic hernia without obstruction or gangrene: Secondary | ICD-10-CM | POA: Diagnosis not present

## 2023-09-17 DIAGNOSIS — K2100 Gastro-esophageal reflux disease with esophagitis, without bleeding: Secondary | ICD-10-CM

## 2023-09-17 DIAGNOSIS — R948 Abnormal results of function studies of other organs and systems: Secondary | ICD-10-CM

## 2023-09-17 DIAGNOSIS — K209 Esophagitis, unspecified without bleeding: Secondary | ICD-10-CM | POA: Diagnosis not present

## 2023-09-17 MED ORDER — PANTOPRAZOLE SODIUM 40 MG PO TBEC: 40.0000 mg | DELAYED_RELEASE_TABLET | Freq: Every day | ORAL | 11 refills | Status: AC

## 2023-09-17 MED ORDER — SODIUM CHLORIDE 0.9 % IV SOLN
500.0000 mL | Freq: Once | INTRAVENOUS | Status: DC
Start: 2023-09-17 — End: 2023-09-17

## 2023-09-17 NOTE — Op Note (Signed)
 [facility] Patient Name: Grant Flores Procedure Date: 09/17/2023 11:14 AM MRN: [id number] Endoscopist: Wilhemina Bonito. Marina Goodell , MD, [id number] Age: 83 Referring MD:  Date of Birth: 05/28/1941 Gender: Male Account #: [id number] Procedure:                Upper GI endoscopy Indications:              Abnormal PET scan of the GI tract. 16 mm                            abnormality in the gastric antrum Medicines:                Monitored Anesthesia Care Procedure:                Pre-Anesthesia Assessment:                           - Prior to the procedure, a History and Physical                            was performed, and patient medications and                            allergies were reviewed. The patient's tolerance of                            previous anesthesia was also reviewed. The risks                            and benefits of the procedure and the sedation                            options and risks were discussed with the patient.                            All questions were answered, and informed consent                            was obtained. Prior Anticoagulants: The patient has                            taken Eliquis (apixaban), last dose was 3 days                            prior to procedure. ASA Grade Assessment: III - A                            patient with severe systemic disease. After                            reviewing the risks and benefits, the patient was                            deemed in satisfactory condition to undergo the  procedure.                           After obtaining informed consent, the endoscope was                            passed under direct vision. Throughout the                            procedure, the patient's blood pressure, pulse, and                            oxygen saturations were monitored continuously. The                            GIF [id number] #[id number] was introduced through the                             mouth, and advanced to the second part of duodenum.                            The upper GI endoscopy was accomplished without                            difficulty. The patient tolerated the procedure                            well. Scope In: Scope Out: Findings:                 The esophagus revealed benign stricturing at the                            gastroesophageal junction. There was also mild                            esophagitis with friability. No Barrett's. No mass.                           The stomach was normal except for a small hiatal                            hernia. The gastric mucosa was intact throughout,                            including the antrum.                           The examined duodenum was normal.                           The cardia and gastric fundus were normal on                            retroflexion. Complications:            No  immediate complications. Estimated Blood Loss:     Estimated blood loss: none. Impression:               1. GERD with esophagitis and stricture                           2. Otherwise unremarkable EGD. No mucosal                            abnormality of the stomach, in particular the                            gastric antrum. Recommendation:           - Patient has a contact number available for                            emergencies. The signs and symptoms of potential                            delayed complications were discussed with the                            patient. Return to normal activities tomorrow.                            Written discharge instructions were provided to the                            patient.                           - Resume previous diet.                           - Continue present medications.                           - PRESCRIBE PANTOPRAZOLE 40 mg daily; #30; 11                            refills. This will protect your esophagus against                             inflammation and scarring                           - Return to the care of your primary providers and                            oncologist Wilhemina Bonito. Marina Goodell, MD 09/17/2023 11:35:50 AM This report has been signed electronically.

## 2023-09-17 NOTE — Progress Notes (Signed)
 Pt's states no medical or surgical changes since previsit or office visit.

## 2023-09-17 NOTE — Patient Instructions (Signed)
 Please read handouts provided. Continue present medications. May take Eliquis today at prior dose. Begin Pantoprazole 40 mg daily. Return to primary provider and oncologist. Resume previous diet.   YOU HAD AN ENDOSCOPIC PROCEDURE TODAY AT THE [facility]:   Refer to the procedure report that was given to you for any specific questions about what was found during the examination.  If the procedure report does not answer your questions, please call your gastroenterologist to clarify.  If you requested that your care partner not be given the details of your procedure findings, then the procedure report has been included in a sealed envelope for you to review at your convenience later.  YOU SHOULD EXPECT: Some feelings of bloating in the abdomen. Passage of more gas than usual.  Walking can help get rid of the air that was put into your GI tract during the procedure and reduce the bloating. If you had a lower endoscopy (such as a colonoscopy or flexible sigmoidoscopy) you may notice spotting of blood in your stool or on the toilet paper. If you underwent a bowel prep for your procedure, you may not have a normal bowel movement for a few days.  Please Note:  You might notice some irritation and congestion in your nose or some drainage.  This is from the oxygen used during your procedure.  There is no need for concern and it should clear up in a day or so.  SYMPTOMS TO REPORT IMMEDIATELY:   Following upper endoscopy (EGD)  Vomiting of blood or coffee ground material  New chest pain or pain under the shoulder blades  Painful or persistently difficult swallowing  New shortness of breath  Fever of 100F or higher  Black, tarry-looking stools  For urgent or emergent issues, a gastroenterologist can be reached at any hour by calling [phone]. Do not use MyChart messaging for urgent concerns.    DIET:  We do recommend a small meal at first, but then you may proceed to your regular  diet.  Drink plenty of fluids but you should avoid alcoholic beverages for 24 hours.  ACTIVITY:  You should plan to take it easy for the rest of today and you should NOT DRIVE or use heavy machinery until tomorrow (because of the sedation medicines used during the test).    FOLLOW UP: Our staff will call the number listed on your records the next business day following your procedure.  We will call around 7:15- 8:00 am to check on you and address any questions or concerns that you may have regarding the information given to you following your procedure. If we do not reach you, we will leave a message.     If any biopsies were taken you will be contacted by phone or by letter within the next 1-3 weeks.  Please call us at [phone] if you have not heard about the biopsies in 3 weeks.    SIGNATURES/CONFIDENTIALITY: You and/or your care partner have signed paperwork which will be entered into your electronic medical record.  These signatures attest to the fact that that the information above on your After Visit Summary has been reviewed and is understood.  Full responsibility of the confidentiality of this discharge information lies with you and/or your care-partner.

## 2023-09-17 NOTE — Progress Notes (Signed)
 Report to PACU, RN, vss, BBS= Clear.

## 2023-09-17 NOTE — Progress Notes (Signed)
 Expand All Collapse All        09/09/2023 Grant Flores [id number] April 22, 1941   Referring provider: Little Ishikawa* Primary GI doctor: Dr. Marina Goodell   ASSESSMENT AND PLAN:     Recent abnormal PET scan July 07 2023 for lung cancer found 16 mm hypermetabolic lesion antral region stomach 08/19/2023 Hgb normal at 14.5 Denies GERD, AB pain, no dysphagia, no nausea vomiting, no weight loss, no family history States he has history of hiatal hernia No history of melena, no NSAIDS, no ETOH history Continues to smoke, has 60 pack year smoking history Never had EGD Will set up for EGD to evaluate with first available, will need to hold eliquis for 2 days, will get permission. I think patient should be able to be done at [facility], he is able to get on the table, no CP, no SOB, recent echo.  Will send a message to [name] for chart check, will get cardiac clearance as well as medical clearance.  I discussed risks of EGD with patient today, including risk of sedation, bleeding or perforation.  Patient provides understanding and gave verbal consent to proceed.   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 Kathrynn Running, last treatment 3/24, last dose tomorrow No chest pain, no SOB, no oxygen   Heart failure 05/2023 EF 40-45 %, no aortic stenosis, had aortic root dilation   Atrial flutter On eliquis 5 mg twice a daily Hold Eliquis for 2 days before procedure will instruct when and how to resume after procedure.  Patient understands that there is a low but real risk of cardiovascular event such as heart attack, stroke, or embolism /  thrombosis, or ischemia while off  Eliquis  The patient consents to proceed.  Will communicate by phone or EMR with patient's prescribing provider to confirm that holding Eliquis is reasonable in this case.    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 male with a past medical history listed below presents for evaluation of abnormal PET scan.    Discussed the use of AI scribe software for clinical note transcription with the patient, who gave verbal consent to proceed.   History of Present Illness   Grant Flores is an 83 year old male with lung cancer who presents for evaluation of a gastric abnormality found on PET scan. He was referred by Dr. Jerolyn Center for evaluation of a gastric abnormality found on PET scan during lung cancer workup.   An area of abnormal uptake was noted in the stomach during a PET scan performed in January 2025 to assess lung masses. He has no symptoms such as abdominal pain, trouble swallowing, nausea, vomiting, decreased appetite, or weight loss. No family history of  gastric or stomach cancers.   He has lung cancer diagnosed in January 2025 and is currently undergoing radiation therapy, with the last dose scheduled for tomorrow. No symptoms such as shortness of breath or chest pain. He is not on oxygen therapy.   He has a significant smoking history, having started at age 88, initially smoking a pack a week, and continues to smoke despite undergoing radiation therapy. No shortness of breath or chest pain.   His past medical history includes atrial flutter, managed with  Eliquis 5 mg twice daily. No recent episodes reported.   He experiences swelling in his legs, managed with Una wraps. The fluid has decreased but not resolved completely. He is not on antibiotics for this condition.   His family history includes a sister who had lung cancer at age 22, attributed to smoking. His mother lived to 59, his sister to 13, and his father to 103.       He  reports that he has been smoking cigarettes. He has never used smokeless tobacco. He reports that he does not drink alcohol and does not use drugs.   RELEVANT GI HISTORY, IMAGING AND LABS: Results   RADIOLOGY PET scan: Abnormal uptake in lung masses and stomach (06/2023)   DIAGNOSTIC Bronchoscopy: Performed in hospital       CBC Labs (Brief)          Component Value Date/Time    WBC 9.1 08/19/2023 1337    WBC 11.7 (H) 07/28/2023 0646    RBC 4.92 08/19/2023 1337    HGB 14.5 08/19/2023 1337    HGB 14.8 05/30/2023 1010    HCT 43.9 08/19/2023 1337    HCT 45.8 05/30/2023 1010    PLT 287 08/19/2023 1337    PLT 229 05/30/2023 1010    MCV 89.2 08/19/2023 1337    MCV 93 05/30/2023 1010    MCH 29.5 08/19/2023 1337    MCHC 33.0 08/19/2023 1337    RDW 15.1 08/19/2023 1337    RDW 13.5 05/30/2023 1010    LYMPHSABS 1.9 08/19/2023 1337    MONOABS 0.9 08/19/2023 1337    EOSABS 0.3 08/19/2023 1337    BASOSABS 0.1 08/19/2023 1337      Recent Labs (within last 365 days)       Recent Labs    05/30/23 1010 07/28/23 0646 08/19/23 1337  HGB 14.8 14.9 14.5        CMP     Labs (Brief)          Component Value Date/Time    NA 136 08/19/2023 1337    NA 141 06/12/2023 1359    K 3.9 08/19/2023 1337    CL 104 08/19/2023 1337    CO2 26 08/19/2023 1337    GLUCOSE 121 (H) 08/19/2023 1337    BUN 11 08/19/2023 1337    BUN 9 06/12/2023 1359    CREATININE 0.93 08/19/2023 1337    CALCIUM 8.3 (L) 08/19/2023 1337    PROT 6.7 08/19/2023 1337    PROT 6.5 05/30/2023 1010    ALBUMIN 3.4 (L) 08/19/2023 1337     ALBUMIN 3.6 (L) 05/30/2023 1010    AST 13 (L) 08/19/2023 1337    ALT 7 08/19/2023 1337    ALKPHOS 79 08/19/2023 1337    BILITOT 0.5 08/19/2023 1337    GFRNONAA >60 08/19/2023 1337    GFRAA >90 05/25/2011 0500          Latest Ref Rng & Units 08/19/2023    1:37  PM 05/30/2023   10:10 AM 05/24/2011    9:27 PM  Hepatic Function  Total Protein 6.5 - 8.1 g/dL 6.7  6.5  6.0   Albumin 3.5 - 5.0 g/dL 3.4  3.6  3.4   AST 15 - 41 U/L 13  17  17    ALT 0 - 44 U/L 7  13  12    Alk Phosphatase 38 - 126 U/L 79  99  49   Total Bilirubin 0.0 - 1.2 mg/dL 0.5  0.9  0.7       Current Medications:      Current Outpatient Medications (Cardiovascular):    atorvastatin (LIPITOR) 80 MG tablet, TAKE 1 TABLET BY MOUTH ONCE  DAILY   carvedilol (COREG) 6.25 MG tablet, TAKE 1 TABLET BY MOUTH TWICE  DAILY WITH MEALS   losartan (COZAAR) 25 MG tablet, Take 1 tablet (25 mg total) by mouth daily.   torsemide (DEMADEX) 20 MG tablet, Take 2 tablets (40 mg total) by mouth 2 (two) times daily.   Current Outpatient Medications (Respiratory):    SPIRIVA RESPIMAT 2.5 MCG/ACT AERS, Inhale into the lungs as needed.     Current Outpatient Medications (Hematological):    apixaban (ELIQUIS) 5 MG TABS tablet, Take 1 tablet (5 mg total) by mouth 2 (two) times daily. Okay to restart this medication on 07/29/2023   Current Outpatient Medications (Other):    azithromycin (ZITHROMAX Z-PAK) 250 MG tablet, Take 2 tablets ( 500 mg total) Day 1 Take 1 tablet ( 250 mg)  Day 2 through 5   potassium chloride (KLOR-CON) 10 MEQ tablet, Take 2 tablets (20 mEq total) by mouth 2 (two) times daily.   Medical History:      Past Medical History:  Diagnosis Date   Asthma     CAD (coronary artery disease) 03/18/2007    s/p inferolateral STEMI.... PCI with BMS to LCX. (cath with EF 50%, mod inf HK, RCA 40%, small oD3 70%, OM1 40%, CFX occluded - tx with PCI);  echo 10/08: EF 45%, mild RVE;  Myoview 2009. Inferior infarct. no ischemia, EF 45%    COPD (chronic obstructive pulmonary disease) (HCC)      with ongoing tobacco   High cholesterol     Hypertension     Lung cancer (HCC)     Myocardial infarction (HCC) 06/17/2006   Obesity     Shortness of breath      with exertion        Allergies:  Allergies  No Known Allergies      Surgical History:  He  has a past surgical history that includes Bare Metal Stent; Arthrodesis; Carpal tunnel release (unknown); Cardiovascular stress test (04/03/2011); Cardiac catheterization (06/17/2006); Anterior cervical decomp/discectomy fusion (before 2008); Appendectomy (age 9); Total knee arthroplasty (05/24/2011); Bronchial biopsy (07/28/2023); Bronchial brushings (07/28/2023); Bronchial needle aspiration biopsy (07/28/2023); Bronchial washings (07/28/2023); and Fiducial marker placement (07/28/2023). Family History:  His family history includes Heart attack (age of onset: 40) in his father; Throat cancer in his sister.   REVIEW OF SYSTEMS  : All other systems reviewed and negative except where noted in the History of Present Illness.   PHYSICAL EXAM: BP (!) 140/76   Pulse 82   Ht 5\' 9"  (1.753 m)   Wt 201 lb 8 oz (91.4 kg)   BMI 29.76 kg/m  Physical Exam   GENERAL APPEARANCE: obese, chronically ill appearing, in no apparent distress. HEENT: No cervical lymphadenopathy, unremarkable thyroid, sclerae anicteric, conjunctiva pink. RESPIRATORY: Respiratory effort normal, breath  sounds equal bilaterally with wheezing/rhonchi. without rales, CARDIO: Regular rate and rhythm with no murmurs, rubs, or gallops, peripheral pulses intact. ABDOMEN: Soft, obese, active bowel sounds in all four quadrants, non-tender, no rebound, no mass appreciated. RECTAL: Declines. MUSCULOSKELETAL: Full range of motion, walks with cane, able to get on table SKIN: Dry, intact without rashes or lesions. No jaundice. NEURO: Alert, oriented, no focal deficits. PSYCH: Cooperative, normal mood and affect. EXTREMITIES:  Edema present in legs with unna boots   Doree Albee, PA-C 11:27 AM

## 2023-09-18 ENCOUNTER — Telehealth: Payer: Self-pay

## 2023-09-18 ENCOUNTER — Encounter (HOSPITAL_BASED_OUTPATIENT_CLINIC_OR_DEPARTMENT_OTHER): Attending: Internal Medicine | Admitting: Internal Medicine

## 2023-09-18 DIAGNOSIS — J449 Chronic obstructive pulmonary disease, unspecified: Secondary | ICD-10-CM | POA: Insufficient documentation

## 2023-09-18 DIAGNOSIS — Z85118 Personal history of other malignant neoplasm of bronchus and lung: Secondary | ICD-10-CM | POA: Insufficient documentation

## 2023-09-18 DIAGNOSIS — L97822 Non-pressure chronic ulcer of other part of left lower leg with fat layer exposed: Secondary | ICD-10-CM | POA: Insufficient documentation

## 2023-09-18 DIAGNOSIS — Z09 Encounter for follow-up examination after completed treatment for conditions other than malignant neoplasm: Secondary | ICD-10-CM | POA: Diagnosis not present

## 2023-09-18 DIAGNOSIS — F17200 Nicotine dependence, unspecified, uncomplicated: Secondary | ICD-10-CM | POA: Diagnosis not present

## 2023-09-18 DIAGNOSIS — I5032 Chronic diastolic (congestive) heart failure: Secondary | ICD-10-CM | POA: Insufficient documentation

## 2023-09-18 DIAGNOSIS — I87312 Chronic venous hypertension (idiopathic) with ulcer of left lower extremity: Secondary | ICD-10-CM | POA: Insufficient documentation

## 2023-09-18 NOTE — Telephone Encounter (Signed)
 Follow up call to pt, no answer.

## 2023-09-25 ENCOUNTER — Encounter (HOSPITAL_BASED_OUTPATIENT_CLINIC_OR_DEPARTMENT_OTHER): Admitting: Internal Medicine

## 2023-09-25 DIAGNOSIS — L97822 Non-pressure chronic ulcer of other part of left lower leg with fat layer exposed: Secondary | ICD-10-CM

## 2023-09-25 DIAGNOSIS — J449 Chronic obstructive pulmonary disease, unspecified: Secondary | ICD-10-CM

## 2023-09-25 DIAGNOSIS — I87312 Chronic venous hypertension (idiopathic) with ulcer of left lower extremity: Secondary | ICD-10-CM

## 2023-09-25 DIAGNOSIS — I5032 Chronic diastolic (congestive) heart failure: Secondary | ICD-10-CM | POA: Diagnosis not present

## 2023-09-25 DIAGNOSIS — Z09 Encounter for follow-up examination after completed treatment for conditions other than malignant neoplasm: Secondary | ICD-10-CM | POA: Diagnosis not present

## 2023-10-02 ENCOUNTER — Encounter (HOSPITAL_BASED_OUTPATIENT_CLINIC_OR_DEPARTMENT_OTHER): Admitting: Internal Medicine

## 2023-10-02 DIAGNOSIS — Z09 Encounter for follow-up examination after completed treatment for conditions other than malignant neoplasm: Secondary | ICD-10-CM | POA: Diagnosis not present

## 2023-10-03 NOTE — Progress Notes (Addendum)
  Radiation Oncology         [phone] ________________________________  Name: Grant Flores MRN: [id number]  Date of Service: 10/03/2023  DOB: 03-09-1941  Post Treatment Telephone Note  Diagnosis:  C34.32 Malignant neoplasm of lower lobe, left bronchus or lung; C34.11 Malignant neoplasm of upper lobe, right bronchus or lung (as documented in provider EOT note)  The patient was available for call today.   Symptoms of fatigue have improved since completing therapy.  Symptoms of skin changes have improved since completing therapy.  Symptoms of esophagitis have improved since completing therapy.   The patient has scheduled follow up with his medical oncologist Dr. Byrum for ongoing care, and was encouraged to call if he  develops concerns or questions regarding radiation.   This concludes the interaction.  Avery Bodo, LPN

## 2023-10-07 ENCOUNTER — Ambulatory Visit
Admission: RE | Admit: 2023-10-07 | Discharge: 2023-10-07 | Disposition: A | Discharge status: Home / Self Care | Source: Ambulatory Visit | Attending: Internal Medicine | Admitting: Internal Medicine

## 2023-10-09 ENCOUNTER — Encounter (HOSPITAL_BASED_OUTPATIENT_CLINIC_OR_DEPARTMENT_OTHER): Admitting: Internal Medicine

## 2023-10-15 ENCOUNTER — Other Ambulatory Visit: Payer: Self-pay | Admitting: Cardiovascular Disease

## 2023-10-15 DIAGNOSIS — I483 Typical atrial flutter: Secondary | ICD-10-CM

## 2023-10-15 NOTE — Telephone Encounter (Signed)
 Eliquis  5mg  refill request received. Patient is 83 years old, weight-91.2kg, Crea-0.93 on 08/19/23, Diagnosis-Aflutter, and last seen by Dr. Arlester Ladd on 06/30/23. Dose is appropriate based on dosing criteria. Will send in refill to requested pharmacy.

## 2023-10-28 NOTE — Addendum Note (Signed)
 Encounter addended by: Keitha Pata, PA-C on: 10/28/2023 12:14 PM  Actions taken: Clinical Note Signed

## 2023-10-29 ENCOUNTER — Other Ambulatory Visit: Payer: Self-pay | Admitting: Cardiovascular Disease

## 2023-11-18 NOTE — Progress Notes (Signed)
 Called the pt, but pt deferred to his wife, Grant Flores. I let the pt's wife know the Chest CT needed for his f/u appt with Dr. Marguerita Shih has been scheduled for 7/1 at 10:30. I also reviewed the rest of the pts upcoming appts with Grant Flores as she was writing them down and reading them back which were 7/1 at 9:00 at the cancer for labs before the pt's CT, and a follow up appt with Dr. Marguerita Shih on 7/8 at 9:00. No questions upon the conclusion of our discussion. Grant Flores verbalized understanding of information provided.

## 2023-12-13 ENCOUNTER — Other Ambulatory Visit: Payer: Self-pay | Admitting: Physician Assistant

## 2023-12-13 DIAGNOSIS — I2510 Atherosclerotic heart disease of native coronary artery without angina pectoris: Secondary | ICD-10-CM

## 2023-12-13 DIAGNOSIS — F17200 Nicotine dependence, unspecified, uncomplicated: Secondary | ICD-10-CM

## 2023-12-13 DIAGNOSIS — R600 Localized edema: Secondary | ICD-10-CM

## 2023-12-13 DIAGNOSIS — Z79899 Other long term (current) drug therapy: Secondary | ICD-10-CM

## 2023-12-13 DIAGNOSIS — E782 Mixed hyperlipidemia: Secondary | ICD-10-CM

## 2023-12-13 DIAGNOSIS — I5032 Chronic diastolic (congestive) heart failure: Secondary | ICD-10-CM

## 2023-12-13 DIAGNOSIS — I483 Typical atrial flutter: Secondary | ICD-10-CM

## 2023-12-16 ENCOUNTER — Inpatient Hospital Stay

## 2023-12-16 ENCOUNTER — Ambulatory Visit (HOSPITAL_COMMUNITY)

## 2023-12-23 ENCOUNTER — Ambulatory Visit: Admitting: Internal Medicine

## 2024-01-07 ENCOUNTER — Ambulatory Visit (HOSPITAL_COMMUNITY)

## 2024-01-07 ENCOUNTER — Telehealth: Payer: Self-pay | Admitting: Internal Medicine

## 2024-01-07 ENCOUNTER — Inpatient Hospital Stay

## 2024-01-07 NOTE — Telephone Encounter (Signed)
 Left a voicemail informing me that they would not be able to make todays appointments.

## 2024-01-12 ENCOUNTER — Telehealth: Payer: Self-pay | Admitting: Internal Medicine

## 2024-01-12 NOTE — Telephone Encounter (Signed)
 Spoke to the patients wife and informed her to call the radiology department to reschedule her husbands CT scan. She expressed that they have both been sick and will give it a couple of days before calling.

## 2024-01-14 ENCOUNTER — Inpatient Hospital Stay: Admitting: Internal Medicine

## 2024-02-26 ENCOUNTER — Ambulatory Visit: Admitting: Internal Medicine

## 2024-02-26 ENCOUNTER — Other Ambulatory Visit

## 2024-03-17 DEATH — deceased
